# Patient Record
Sex: Male | Born: 1960 | Race: White | Hispanic: No | Marital: Married | Smoking: Never smoker
Health system: Southern US, Community
[De-identification: ages and names within clinical notes are randomized; demographics above are authoritative.]

## PROBLEM LIST (undated history)

## (undated) DIAGNOSIS — K2970 Gastritis, unspecified, without bleeding: Secondary | ICD-10-CM

## (undated) DIAGNOSIS — K219 Gastro-esophageal reflux disease without esophagitis: Secondary | ICD-10-CM

## (undated) DIAGNOSIS — M1990 Unspecified osteoarthritis, unspecified site: Secondary | ICD-10-CM

## (undated) DIAGNOSIS — F32A Depression, unspecified: Secondary | ICD-10-CM

## (undated) HISTORY — DX: Gastro-esophageal reflux disease without esophagitis: K21.9

## (undated) HISTORY — DX: Unspecified osteoarthritis, unspecified site: M19.90

## (undated) HISTORY — DX: Depression, unspecified: F32.A

## (undated) HISTORY — PX: FRACTURE SURGERY: SHX138

## (undated) HISTORY — PX: APPENDECTOMY: SHX54

---

## 2008-05-14 ENCOUNTER — Emergency Department (HOSPITAL_COMMUNITY): Admission: EM | Admit: 2008-05-14 | Discharge: 2008-05-14 | Payer: Self-pay | Admitting: Emergency Medicine

## 2010-12-24 ENCOUNTER — Ambulatory Visit: Payer: Self-pay

## 2010-12-24 ENCOUNTER — Other Ambulatory Visit: Payer: Self-pay | Admitting: Occupational Medicine

## 2010-12-24 DIAGNOSIS — R52 Pain, unspecified: Secondary | ICD-10-CM

## 2014-02-03 ENCOUNTER — Encounter (HOSPITAL_COMMUNITY): Payer: Self-pay | Admitting: Emergency Medicine

## 2014-02-03 ENCOUNTER — Emergency Department (HOSPITAL_COMMUNITY)
Admission: EM | Admit: 2014-02-03 | Discharge: 2014-02-03 | Disposition: A | Discharge status: Home / Self Care | Payer: 59 | Attending: Emergency Medicine | Admitting: Emergency Medicine

## 2014-02-03 DIAGNOSIS — W219XXA Striking against or struck by unspecified sports equipment, initial encounter: Secondary | ICD-10-CM | POA: Insufficient documentation

## 2014-02-03 DIAGNOSIS — S0100XA Unspecified open wound of scalp, initial encounter: Secondary | ICD-10-CM | POA: Diagnosis present

## 2014-02-03 DIAGNOSIS — Y9289 Other specified places as the place of occurrence of the external cause: Secondary | ICD-10-CM | POA: Insufficient documentation

## 2014-02-03 DIAGNOSIS — S0101XA Laceration without foreign body of scalp, initial encounter: Secondary | ICD-10-CM

## 2014-02-03 DIAGNOSIS — Y9364 Activity, baseball: Secondary | ICD-10-CM | POA: Diagnosis not present

## 2014-02-03 MED ORDER — NAPROXEN 500 MG PO TABS: 500.0000 mg | ORAL_TABLET | Freq: Two times a day (BID) | ORAL | Status: DC

## 2014-02-03 MED ORDER — BACITRACIN ZINC 500 UNIT/GM EX OINT: 1.0000 "application " | TOPICAL_OINTMENT | Freq: Two times a day (BID) | CUTANEOUS | Status: DC

## 2014-02-03 MED ORDER — BACITRACIN 500 UNIT/GM EX OINT
1.0000 "application " | TOPICAL_OINTMENT | Freq: Once | CUTANEOUS | Status: AC
  Administered 2014-02-03: 1 via TOPICAL
  Filled 2014-02-03: qty 0.9

## 2014-02-03 MED ORDER — LIDOCAINE-EPINEPHRINE (PF) 2 %-1:200000 IJ SOLN
10.0000 mL | Freq: Once | INTRAMUSCULAR | Status: AC
  Administered 2014-02-03: 10 mL
  Filled 2014-02-03: qty 20

## 2014-02-03 NOTE — ED Notes (Signed)
Patient denies LOC, dizziness or other related symptoms

## 2014-02-03 NOTE — Discharge Instructions (Signed)

## 2014-02-03 NOTE — ED Provider Notes (Signed)
CSN: [id number]     Arrival date & time 02/03/14  2111 History    Chief Complaint  Patient presents with  . Head Laceration    HPI Comments: 53 year old male presents to the emergency department for further evaluation of head laceration. Patient states that he was playing softball when he ran into a fence after running past plate. Patient denies any loss of consciousness. No concussive symptoms including vision changes, nausea, vomiting, extremity numbness/tingling, or extremity weakness. Patient complaining of minimal pain at present. No medications or management of symptoms PTA. Tetanus last updated 3 days ago. Patient is not on blood thinners.  Patient is a 53 y.o. male presenting with scalp laceration. The history is provided by the patient. No language interpreter was used.  Head Laceration Pertinent negatives include no headaches.  Head Laceration Pertinent negatives include no headaches, nausea, numbness, vomiting or weakness.    History reviewed. No pertinent past medical history. Past Surgical History  Procedure Laterality Date  . Appendectomy     No family history on file. History  Substance Use Topics  . Smoking status: Never Smoker   . Smokeless tobacco: Never Used  . Alcohol Use: No    Review of Systems  Gastrointestinal: Negative for nausea and vomiting.  Skin: Positive for wound.  Neurological: Negative for syncope, weakness, numbness and headaches.  All other systems reviewed and are negative.   Allergies  Review of patient's allergies indicates no known allergies.  Home Medications   Prior to Admission medications   Not on File   BP 151/81  Pulse 74  Temp(Src) 98 F (36.7 C) (Oral)  Resp 16  Ht  (1.778 m)  Wt 220 lb (99.791 kg)  BMI 31.57 kg/m2  SpO2 98%  Physical Exam  Nursing note and vitals reviewed. Constitutional: He is oriented to person, place, and time. He appears well-developed and well-nourished. No distress.  HENT:  Head:  Normocephalic. Head is with laceration. Head is without raccoon's eyes and without Battle's sign.    4cm jagged laceration to R frontal forehead. Bleeding controlled.  Eyes: Conjunctivae and EOM are normal. No scleral icterus.  Neck: Normal range of motion.  Cardiovascular: Normal rate.   Pulmonary/Chest: Effort normal. No respiratory distress. He has no wheezes.  Chest expansion symmetric  Musculoskeletal: Normal range of motion.  Neurological: He is alert and oriented to person, place, and time. He exhibits normal muscle tone. Coordination normal.  GCS 15. Patient moves extremities without ataxia.  Skin: Skin is warm and dry. No rash noted. He is not diaphoretic. No erythema. No pallor.  Psychiatric: He has a normal mood and affect. His behavior is normal.    ED Course  Procedures (including critical care time) Labs Review Labs Reviewed - No data to display  No results found for this or any previous visit. No results found.  Imaging Review No results found.   EKG Interpretation None     Medications - No data to display  LACERATION REPAIR Performed by: Antony Madura Authorized by: Antony Madura Consent: Verbal consent obtained. Risks and benefits: risks, benefits and alternatives were discussed Consent given by: patient Patient identity confirmed: provided demographic data Prepped and Draped in normal sterile fashion Wound explored  Laceration Location: R frontal scalp  Laceration Length: 4cm  No Foreign Bodies seen or palpated  Anesthesia: local infiltration  Local anesthetic: lidocaine 2% with epinephrine  Anesthetic total: 3 ml  Irrigation method: syringe Amount of cleaning: standard  Skin closure: 4-0 prolene  Number of sutures: 4  Technique: simple interrupted  Patient tolerance: Patient tolerated the procedure well with no immediate complications.   MDM   Final diagnoses:  Scalp laceration, initial encounter    Tdap booster UTD. Laceration  occurred < 8 hours prior to repair which was well tolerated. Pt has no comorbidities to effect normal wound healing. Discussed suture home care with patient and answered questions. Pt to follow up for wound check and suture removal in 5-7 days. Pt is hemodynamically stable with no complaints prior to discharge. Return precautions provided and patient agreeable to plan with no unaddressed concerns.    I personally performed the services described in this documentation, which was scribed in my presence. The recorded information has been reviewed and is accurate.   Filed Vitals:   02/03/14 2128 02/03/14 2132  BP:  151/81  Pulse:  74  Temp:  98 F (36.7 C)  TempSrc: Oral Oral  Resp:  16  Height:   (1.778 m)  Weight:  220 lb (99.791 kg)  SpO2:  98%     Antony Madura, PA-C 02/03/14 2306

## 2014-02-03 NOTE — ED Notes (Signed)
Patient presents with dressing on his forehead.  States he was playing ball in a senior league and over ran home plate and hit his forehead on the fence,

## 2014-02-04 NOTE — ED Provider Notes (Signed)
Medical screening examination/treatment/procedure(s) were performed by non-physician practitioner and as supervising physician I was immediately available for consultation/collaboration.   EKG Interpretation None       Elgie Maziarz, MD 02/04/14 1500 

## 2014-05-24 ENCOUNTER — Emergency Department (HOSPITAL_COMMUNITY): Payer: Worker's Compensation

## 2014-05-24 ENCOUNTER — Encounter (HOSPITAL_COMMUNITY): Payer: Self-pay | Admitting: Family Medicine

## 2014-05-24 ENCOUNTER — Emergency Department (HOSPITAL_COMMUNITY)
Admission: EM | Admit: 2014-05-24 | Discharge: 2014-05-24 | Disposition: A | Discharge status: Home / Self Care | Payer: Worker's Compensation | Attending: Emergency Medicine | Admitting: Emergency Medicine

## 2014-05-24 DIAGNOSIS — Y9389 Activity, other specified: Secondary | ICD-10-CM | POA: Diagnosis not present

## 2014-05-24 DIAGNOSIS — Z791 Long term (current) use of non-steroidal anti-inflammatories (NSAID): Secondary | ICD-10-CM | POA: Insufficient documentation

## 2014-05-24 DIAGNOSIS — Z23 Encounter for immunization: Secondary | ICD-10-CM | POA: Insufficient documentation

## 2014-05-24 DIAGNOSIS — Z8719 Personal history of other diseases of the digestive system: Secondary | ICD-10-CM | POA: Insufficient documentation

## 2014-05-24 DIAGNOSIS — W11XXXA Fall on and from ladder, initial encounter: Secondary | ICD-10-CM | POA: Diagnosis not present

## 2014-05-24 DIAGNOSIS — S5011XA Contusion of right forearm, initial encounter: Secondary | ICD-10-CM | POA: Diagnosis not present

## 2014-05-24 DIAGNOSIS — Z792 Long term (current) use of antibiotics: Secondary | ICD-10-CM | POA: Insufficient documentation

## 2014-05-24 DIAGNOSIS — S0081XA Abrasion of other part of head, initial encounter: Secondary | ICD-10-CM | POA: Insufficient documentation

## 2014-05-24 DIAGNOSIS — S0993XA Unspecified injury of face, initial encounter: Secondary | ICD-10-CM | POA: Diagnosis present

## 2014-05-24 DIAGNOSIS — S0083XA Contusion of other part of head, initial encounter: Secondary | ICD-10-CM | POA: Diagnosis not present

## 2014-05-24 DIAGNOSIS — Y9289 Other specified places as the place of occurrence of the external cause: Secondary | ICD-10-CM | POA: Insufficient documentation

## 2014-05-24 DIAGNOSIS — Y998 Other external cause status: Secondary | ICD-10-CM | POA: Diagnosis not present

## 2014-05-24 HISTORY — DX: Gastritis, unspecified, without bleeding: K29.70

## 2014-05-24 MED ORDER — TETANUS-DIPHTH-ACELL PERTUSSIS 5-2.5-18.5 LF-MCG/0.5 IM SUSP
0.5000 mL | Freq: Once | INTRAMUSCULAR | Status: AC
  Administered 2014-05-24: 0.5 mL via INTRAMUSCULAR
  Filled 2014-05-24: qty 0.5

## 2014-05-24 MED ORDER — FENTANYL CITRATE 0.05 MG/ML IJ SOLN
50.0000 ug | Freq: Once | INTRAMUSCULAR | Status: AC
Start: 2014-05-24 — End: 2014-05-24
  Administered 2014-05-24: 50 ug via INTRAVENOUS
  Filled 2014-05-24: qty 2

## 2014-05-24 MED ORDER — MORPHINE SULFATE 4 MG/ML IJ SOLN
4.0000 mg | Freq: Once | INTRAMUSCULAR | Status: AC
  Administered 2014-05-24: 4 mg via INTRAVENOUS
  Filled 2014-05-24: qty 1

## 2014-05-24 MED ORDER — HYDROCODONE-ACETAMINOPHEN 5-325 MG PO TABS: 1.0000 | ORAL_TABLET | Freq: Four times a day (QID) | ORAL | Status: DC | PRN

## 2014-05-24 NOTE — ED Notes (Addendum)
Pt presents via POV with c/o falling approx 756ft off of a ladder. Pt has significant edema to right forearm, and abrasions to right forehead and 2 small abrasions to posterior head. PMS intact to LUE. Pt denies LOC. Pt A&Ox4 in NAD.

## 2014-05-24 NOTE — ED Notes (Signed)
Pt transported to CT ?

## 2014-05-24 NOTE — Discharge Instructions (Signed)
Contusion °A contusion is a deep bruise. Contusions are the result of an injury that caused bleeding under the skin. The contusion may turn blue, purple, or yellow. Minor injuries will give you a painless contusion, but more severe contusions may stay painful and swollen for a few weeks.  °CAUSES  °A contusion is usually caused by a blow, trauma, or direct force to an area of the body. °SYMPTOMS  °· Swelling and redness of the injured area. °· Bruising of the injured area. °· Tenderness and soreness of the injured area. °· Pain. °DIAGNOSIS  °The diagnosis can be made by taking a history and physical exam. An X-ray, CT scan, or MRI may be needed to determine if there were any associated injuries, such as fractures. °TREATMENT  °Specific treatment will depend on what area of the body was injured. In general, the best treatment for a contusion is resting, icing, elevating, and applying cold compresses to the injured area. Over-the-counter medicines may also be recommended for pain control. Ask your caregiver what the best treatment is for your contusion. °HOME CARE INSTRUCTIONS  °· Put ice on the injured area. °· Put ice in a plastic bag. °· Place a towel between your skin and the bag. °· Leave the ice on for 15-20 minutes, 3-4 times a day, or as directed by your health care provider. °· Only take over-the-counter or prescription medicines for pain, discomfort, or fever as directed by your caregiver. Your caregiver may recommend avoiding anti-inflammatory medicines (aspirin, ibuprofen, and naproxen) for 48 hours because these medicines may increase bruising. °· Rest the injured area. °· If possible, elevate the injured area to reduce swelling. °SEEK IMMEDIATE MEDICAL CARE IF:  °· You have increased bruising or swelling. °· You have pain that is getting worse. °· Your swelling or pain is not relieved with medicines. °MAKE SURE YOU:  °· Understand these instructions. °· Will watch your condition. °· Will get help right  away if you are not doing well or get worse. °Document Released: 02/06/2005 Document Revised: 05/04/2013 Document Reviewed: 03/04/2011 °[employer]® Patient Information ©2015 [employer]. This information is not intended to replace advice given to you by your health care provider. Make sure you discuss any questions you have with your health care provider. ° °Blunt Trauma °You have been evaluated for injuries. You have been examined and your caregiver has not found injuries serious enough to require hospitalization. °It is common to have multiple bruises and sore muscles following an accident. These tend to feel worse for the first 24 hours. You will feel more stiffness and soreness over the next several hours and worse when you wake up the first morning after your accident. After this point, you should begin to improve with each passing day. The amount of improvement depends on the amount of damage done in the accident. °Following your accident, if some part of your body does not work as it should, or if the pain in any area continues to increase, you should return to the Emergency Department for re-evaluation.  °HOME CARE INSTRUCTIONS  °Routine care for sore areas should include: °· Ice to sore areas every 2 hours for 20 minutes while awake for the next 2 days. °· Drink extra fluids (not alcohol). °· Take a hot or warm shower or bath once or twice a day to increase blood flow to sore muscles. This will help you "limber up". °· Activity as tolerated. Lifting may aggravate neck or back pain. °· Only take over-the-counter or prescription   medicines for pain, discomfort, or fever as directed by your caregiver. Do not use aspirin. This may increase bruising or increase bleeding if there are small areas where this is happening. °SEEK IMMEDIATE MEDICAL CARE IF: °· Numbness, tingling, weakness, or problem with the use of your arms or legs. °· A severe headache is not relieved with medications. °· There is a change in bowel  or bladder control. °· Increasing pain in any areas of the body. °· Short of breath or dizzy. °· Nauseated, vomiting, or sweating. °· Increasing belly (abdominal) discomfort. °· Blood in urine, stool, or vomiting blood. °· Pain in either shoulder in an area where a shoulder strap would be. °· Feelings of lightheadedness or if you have a fainting episode. °Sometimes it is not possible to identify all injuries immediately after the trauma. It is important that you continue to monitor your condition after the emergency department visit. If you feel you are not improving, or improving more slowly than should be expected, call your physician. If you feel your symptoms (problems) are worsening, return to the Emergency Department immediately. °Document Released: 01/23/2001 Document Revised: 07/22/2011 Document Reviewed: 12/16/2007 °[employer]® Patient Information ©2015 [employer]. This information is not intended to replace advice given to you by your health care provider. Make sure you discuss any questions you have with your health care provider. ° °

## 2014-05-24 NOTE — ED Provider Notes (Signed)
CSN: [id number]     Arrival date & time 05/24/14  1414 History   First MD Initiated Contact with Patient 05/24/14 1455     Chief Complaint  Patient presents with  . Fall     (Consider location/radiation/quality/duration/timing/severity/associated sxs/prior Treatment) HPI Comments: Patient fell approximately 6 feet off of a ladder at work onto his right arm.  He states he thinks his face had a woodpile.  He denies loss of consciousness.  Accident happened 2 hours away from home and he then proceeded to drive himself home.  He denies headache, neck pain, nausea, vomiting, numbness, weakness, chest pain, abdominal pain or back pain.  He thinks he had a tetanus shot 4-5 years ago  Patient is a 54 y.o. male presenting with fall. The history is provided by the patient and the spouse. No language interpreter was used.  Fall This is a new problem. The current episode started 3 to 5 hours ago. The problem occurs rarely. The problem has not changed since onset.Pertinent negatives include no chest pain, no abdominal pain, no headaches and no shortness of breath. Associated symptoms comments: L forearm pain . Exacerbated by: movement. The symptoms are relieved by ice (immobilization). He has tried nothing for the symptoms. The treatment provided no relief.    Past Medical History  Diagnosis Date  . Gastritis    Past Surgical History  Procedure Laterality Date  . Appendectomy     No family history on file. History  Substance Use Topics  . Smoking status: Never Smoker   . Smokeless tobacco: Never Used  . Alcohol Use: No    Review of Systems  Constitutional: Negative for fever, activity change, appetite change and fatigue.  HENT: Negative for congestion, facial swelling, rhinorrhea and trouble swallowing.   Eyes: Negative for photophobia and pain.  Respiratory: Negative for cough, chest tightness and shortness of breath.   Cardiovascular: Negative for chest pain and leg swelling.    Gastrointestinal: Negative for nausea, vomiting, abdominal pain, diarrhea and constipation.  Endocrine: Negative for polydipsia and polyuria.  Genitourinary: Negative for dysuria, urgency, decreased urine volume and difficulty urinating.  Musculoskeletal: Positive for arthralgias. Negative for back pain and gait problem.  Skin: Negative for color change, rash and wound.  Allergic/Immunologic: Negative for immunocompromised state.  Neurological: Negative for dizziness, facial asymmetry, speech difficulty, weakness, numbness and headaches.  Psychiatric/Behavioral: Negative for confusion, decreased concentration and agitation.      Allergies  Review of patient's allergies indicates no known allergies.  Home Medications   Prior to Admission medications   Medication Sig Start Date End Date Taking? Authorizing Provider  bacitracin ointment Apply 1 application topically 2 (two) times daily. Apply to wound to prevent scarring 02/03/14   Antony MaduraKelly Humes, PA-C  naproxen (NAPROSYN) 500 MG tablet Take 1 tablet (500 mg total) by mouth 2 (two) times daily. 02/03/14   Antony MaduraKelly Humes, PA-C   BP 151/88 mmHg  Pulse 70  Temp(Src) 98.1 F (36.7 C) (Oral)  Resp 16  Ht 5\' 10"  (1.778 m)  Wt 218 lb (98.884 kg)  BMI 31.28 kg/m2  SpO2 97% Physical Exam  Constitutional: He is oriented to person, place, and time. He appears well-developed and well-nourished. No distress.  HENT:  Head: Normocephalic. Head is with abrasion.    Mouth/Throat: No oropharyngeal exudate.  Eyes: Pupils are equal, round, and reactive to light.  Neck: Normal range of motion. Neck supple.  Cardiovascular: Normal rate, regular rhythm and normal heart sounds.  Exam reveals no gallop  and no friction rub.   No murmur heard. Pulmonary/Chest: Effort normal and breath sounds normal. No respiratory distress. He has no wheezes. He has no rales.  Abdominal: Soft. Bowel sounds are normal. He exhibits no distension and no mass. There is no  tenderness. There is no rebound and no guarding.  Musculoskeletal: Normal range of motion. He exhibits no edema.       Right forearm: He exhibits tenderness, swelling and deformity.       Arms: Neurological: He is alert and oriented to person, place, and time. He has normal strength. He displays no tremor. No cranial nerve deficit or sensory deficit. He exhibits normal muscle tone. Coordination normal. GCS eye subscore is 4. GCS verbal subscore is 5. GCS motor subscore is 6.  Skin: Skin is warm and dry.  Psychiatric: He has a normal mood and affect.    ED Course  Procedures (including critical care time) Labs Review Labs Reviewed - No data to display  Imaging Review No results found.   EKG Interpretation None      MDM   Final diagnoses:  Fall from ladder, initial encounter    Pt is a 54 y.o. male with Pmhx as above who presents with L forearm pain/deformity and multiple abrasions to head after falling off a ladder from approx 6 foot height at work today. No LOC, no other complaints of pain, though I am concerned pt has a distracting injury with R arm mid forearm deformity. Had a risk-benefit discussion about neuro imaging w/ wife, have decided to pursue neuro imaging as well as imaging of L forearm. Pt NVI distally.    there are no acute CT had her C-spine findings.  X-ray forearm is negative except for soft tissue swelling ( not noted on x-ray.  The visualized on exam).   Will place in sling for comfort and discharged home with Norco as needed for pain.   Ranee Gosselin [place] evaluation in the Emergency Department is complete. It has been determined that no acute conditions requiring further emergency intervention are present at this time. The patient/guardian have been advised of the diagnosis and plan. We have discussed signs and symptoms that warrant return to the ED, such as changes or worsening in symptoms including, worsening pain, numbness, weakness, confusion, nausea, vomiting,  visual changes.      Toy Cookey, MD 05/24/14 1630

## 2014-05-24 NOTE — ED Notes (Signed)
Pt verbalized understanding of discharge instructions/prescriptions. No further questions.  

## 2014-05-25 NOTE — ED Notes (Signed)
This RN witnessed Shanna CiscoKevin Brown, RN waste 50mcg Fentanyl.

## 2015-06-17 IMAGING — CT CT HEAD W/O CM
1 series · 16 of 30 positions shown, 20 images · non-contrast
Comparison: None.

CLINICAL DATA: Fall from ladder.  Abrasions to right forehead.

EXAM:
CT HEAD WITHOUT CONTRAST
CT CERVICAL SPINE WITHOUT CONTRAST
TECHNIQUE: Multidetector CT imaging of the head and cervical spine was
performed following the standard protocol without intravenous
contrast. Multiplanar CT image reconstructions of the cervical spine
were also generated.

[Series 2: head 5.0 h30s · axial · 0.49mm/px · z∈[-64,+76]mm · 16 of 32 slices shown, 20 images]
[im 2/32  brain]
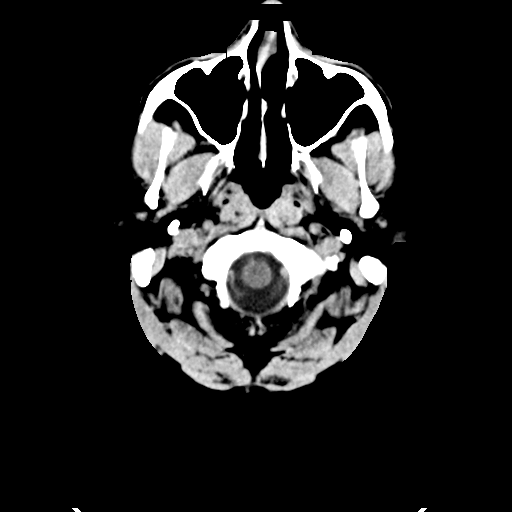
[im 2/32  bone]
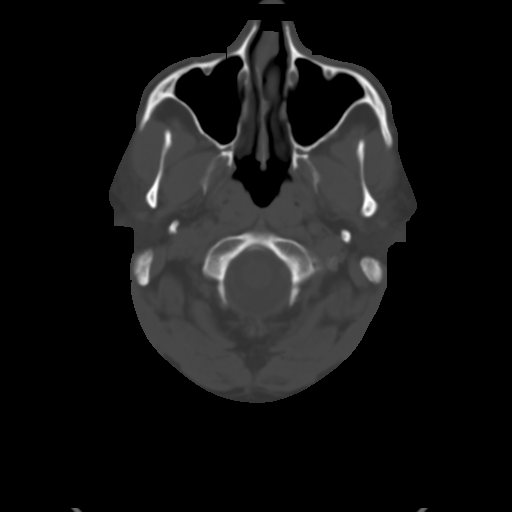
[im 4/32  brain]
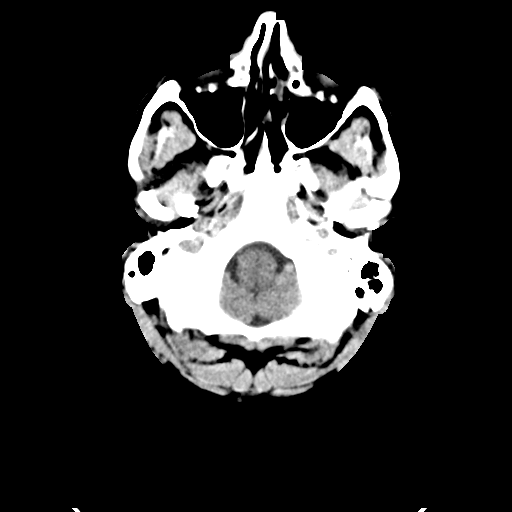
[im 6/32  brain]
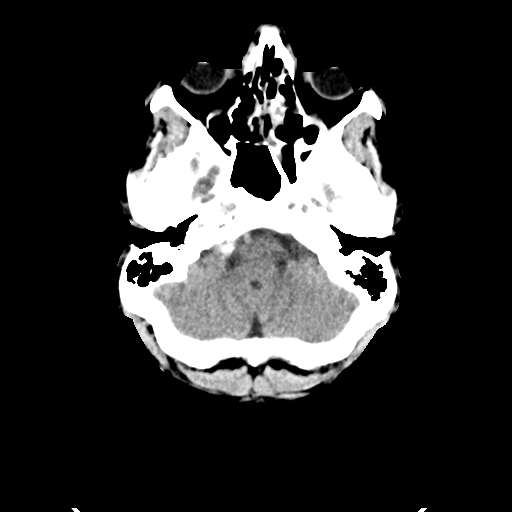
[im 8/32  brain]
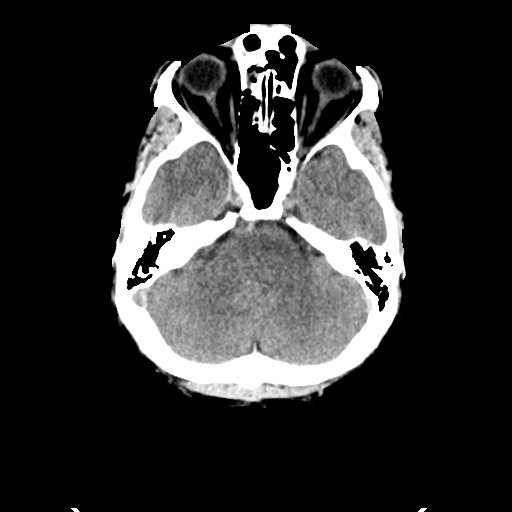
[im 9/32  brain]
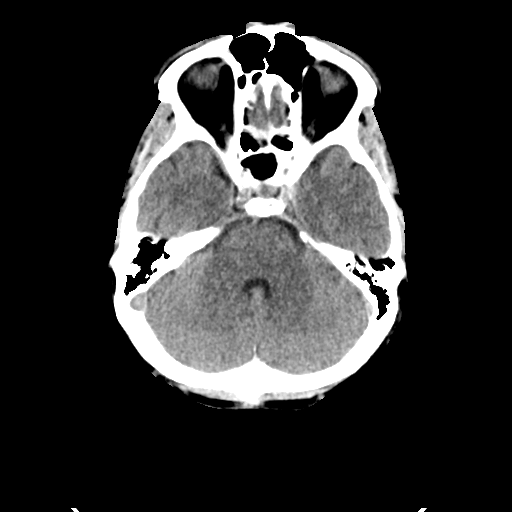
[im 9/32  bone]
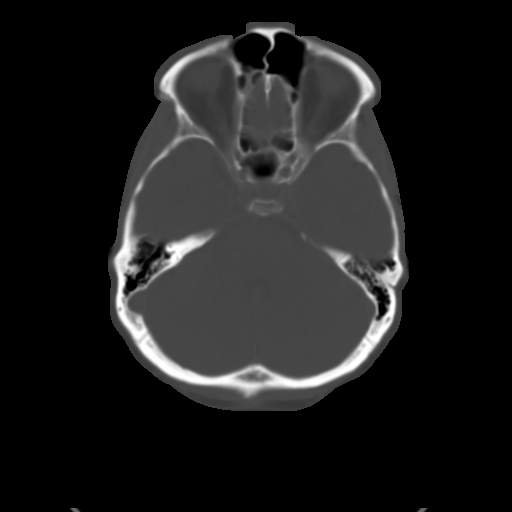
[im 11/32  brain]
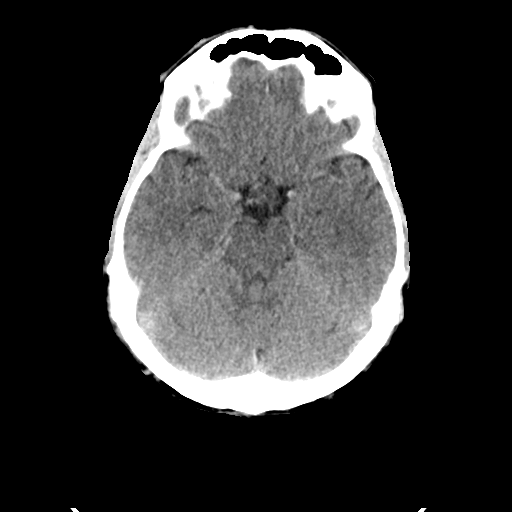
[im 13/32  brain]
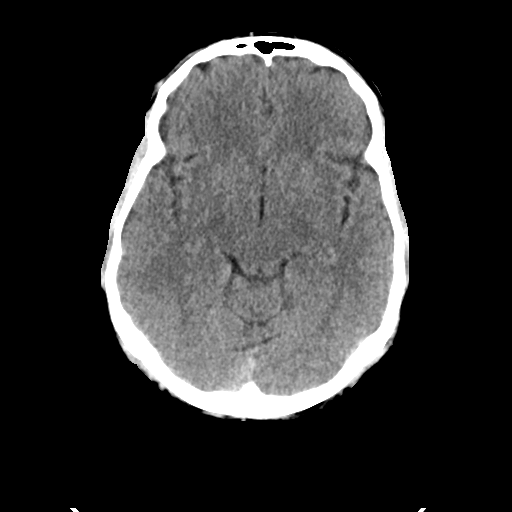
[im 15/32  brain]
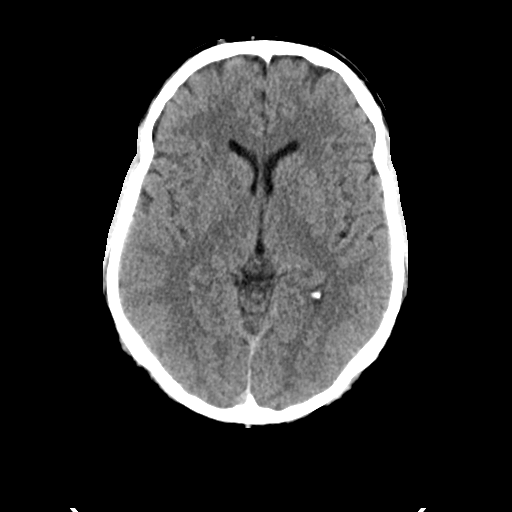
[im 17/32  brain]
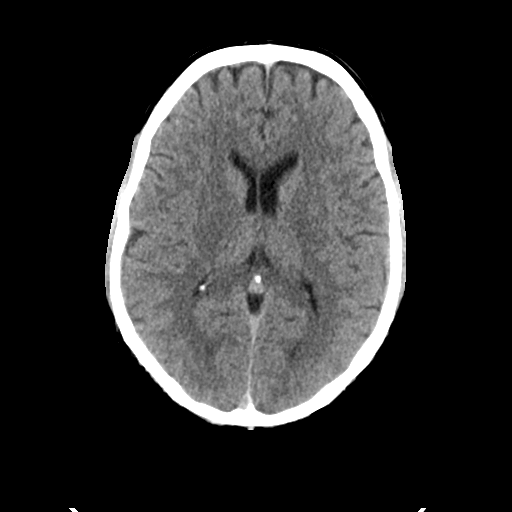
[im 17/32  bone]
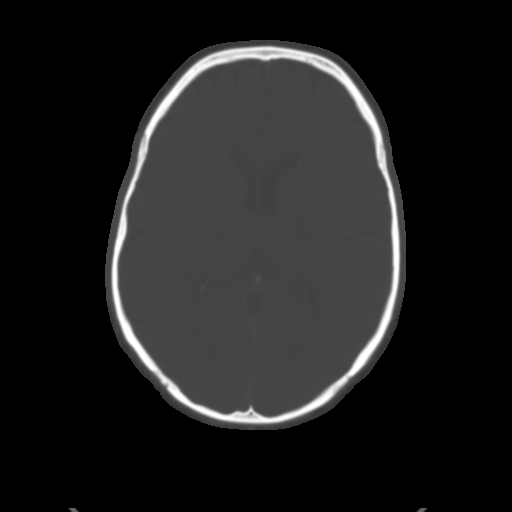
[im 19/32  brain]
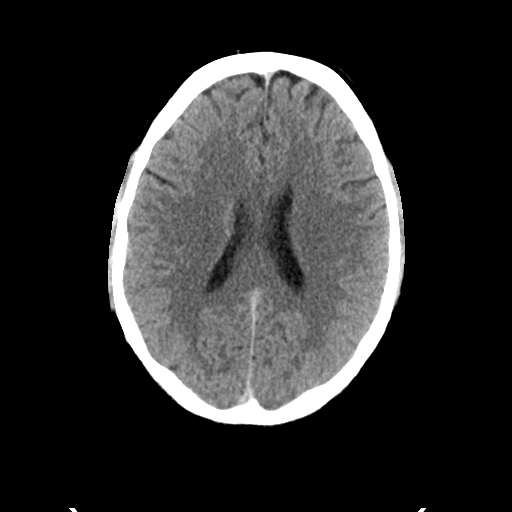
[im 21/32  brain]
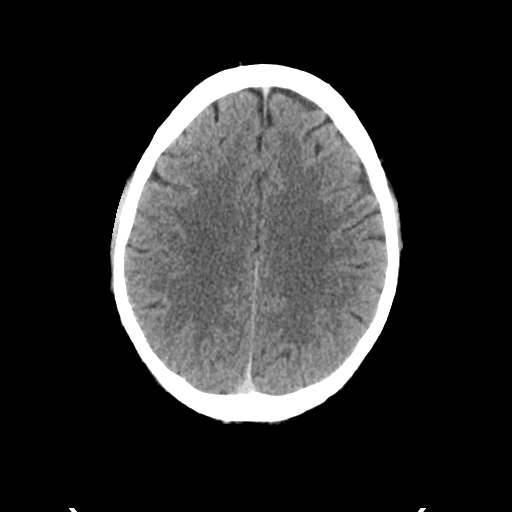
[im 23/32  brain]
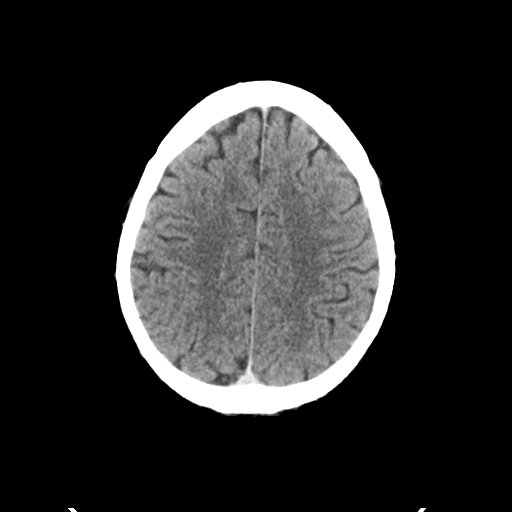
[im 24/32  brain]
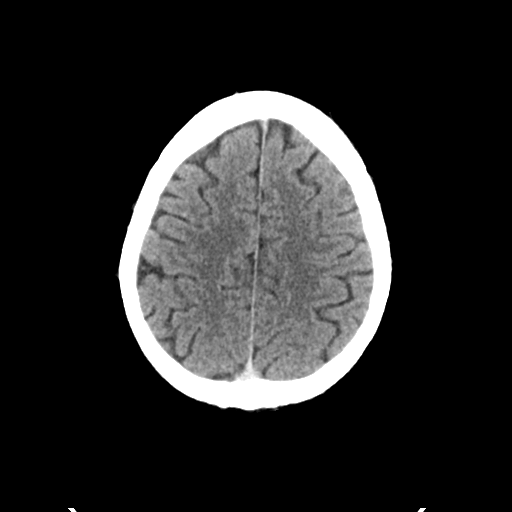
[im 24/32  bone]
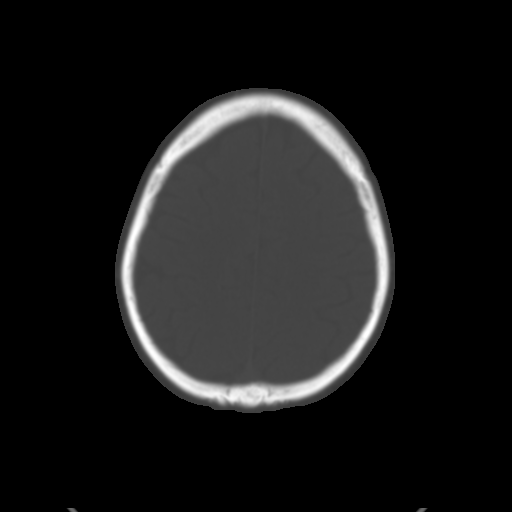
[im 26/32  brain]
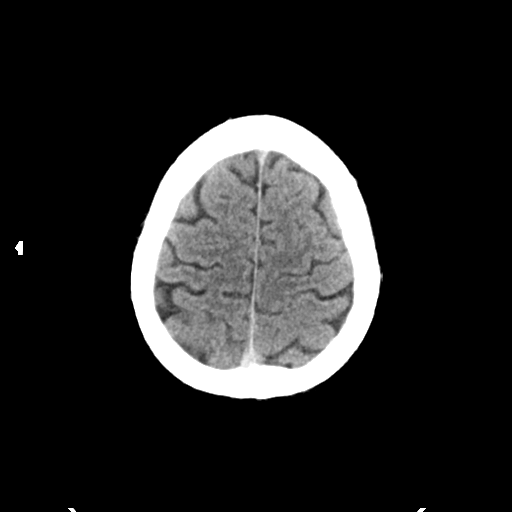
[im 28/32  brain]
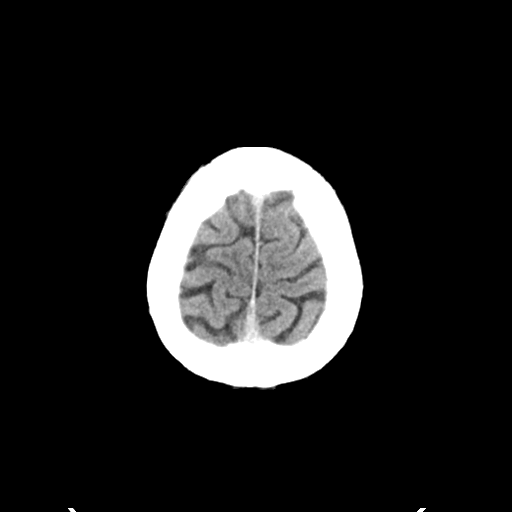
[im 30/32  brain]
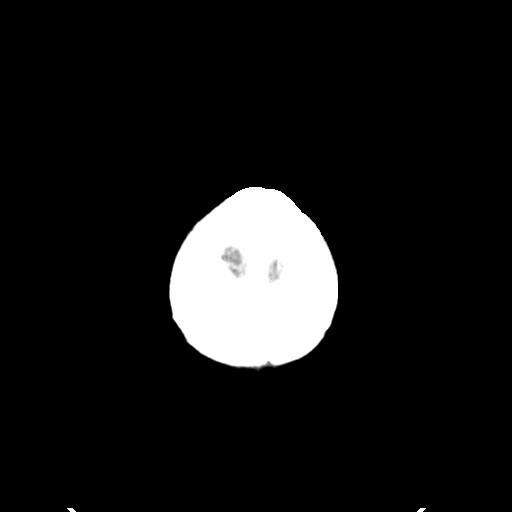

[16 of 30 positions shown; findings below may reference images not displayed]

FINDINGS: CT HEAD FINDINGS

No acute intracranial abnormality. Specifically, no hemorrhage,
hydrocephalus, mass lesion, acute infarction, or significant
intracranial injury. No acute calvarial abnormality.

Mucosal thickening in the paranasal sinuses. No air-fluid levels.
Mastoid air cells are clear.

CT CERVICAL SPINE FINDINGS

Normal alignment. No fracture. Prevertebral soft tissues are normal.
Disc spaces are maintained. Disc spaces are maintained. No epidural
or paraspinal hematoma.
IMPRESSION: No acute intracranial abnormality.

Chronic sinusitis.

No acute bony abnormality within the cervical spine.

## 2018-06-11 ENCOUNTER — Emergency Department (HOSPITAL_COMMUNITY): Payer: 59

## 2018-06-11 ENCOUNTER — Other Ambulatory Visit: Payer: Self-pay

## 2018-06-11 ENCOUNTER — Emergency Department (HOSPITAL_COMMUNITY)
Admission: EM | Admit: 2018-06-11 | Discharge: 2018-06-12 | Disposition: A | Discharge status: Home / Self Care | Payer: 59 | Attending: Emergency Medicine | Admitting: Emergency Medicine

## 2018-06-11 DIAGNOSIS — R0789 Other chest pain: Secondary | ICD-10-CM | POA: Diagnosis not present

## 2018-06-11 DIAGNOSIS — M79602 Pain in left arm: Secondary | ICD-10-CM | POA: Insufficient documentation

## 2018-06-11 DIAGNOSIS — R079 Chest pain, unspecified: Secondary | ICD-10-CM

## 2018-06-11 LAB — CBC
HCT: 42.6 % (ref 39.0–52.0)
Hemoglobin: 15.1 g/dL (ref 13.0–17.0)
MCH: 29.5 pg (ref 26.0–34.0)
MCHC: 35.4 g/dL (ref 30.0–36.0)
MCV: 83.2 fL (ref 80.0–100.0)
NRBC: 0 % (ref 0.0–0.2)
PLATELETS: 251 10*3/uL (ref 150–400)
RBC: 5.12 MIL/uL (ref 4.22–5.81)
RDW: 12.3 % (ref 11.5–15.5)
WBC: 11.6 10*3/uL — ABNORMAL HIGH (ref 4.0–10.5)

## 2018-06-11 LAB — BASIC METABOLIC PANEL
ANION GAP: 7 (ref 5–15)
BUN: 16 mg/dL (ref 6–20)
CALCIUM: 8.9 mg/dL (ref 8.9–10.3)
CO2: 27 mmol/L (ref 22–32)
Chloride: 103 mmol/L (ref 98–111)
Creatinine, Ser: 1.46 mg/dL — ABNORMAL HIGH (ref 0.61–1.24)
GFR, EST NON AFRICAN AMERICAN: 53 mL/min — AB (ref >60)
GLUCOSE: 80 mg/dL (ref 70–99)
Potassium: 4 mmol/L (ref 3.5–5.1)
Sodium: 137 mmol/L (ref 135–145)

## 2018-06-11 LAB — I-STAT TROPONIN, ED: TROPONIN I, POC: 0.01 ng/mL (ref 0.00–0.08)

## 2018-06-11 MED ORDER — SODIUM CHLORIDE 0.9% FLUSH: 3.0000 mL | Freq: Once | INTRAVENOUS | Status: DC

## 2018-06-11 NOTE — ED Triage Notes (Signed)
Pt arrives via POV with complaints of left sided chest pain that radiates to his back since yesterday. Pt has no cardiac history that he knows of, states he does not visit the doctor at all. Pt denies nausea, vomiting, or SOB.

## 2018-06-11 NOTE — ED Provider Notes (Signed)
[facility] EMERGENCY DEPARTMENT Provider Note  CSN: [id number] Arrival date & time: 06/11/18 1854  Chief Complaint(s) Chest Pain  HPI Kenneth Calhoun is a 58 y.o. male   The history is provided by the patient.  Chest Pain  Pain location:  L chest and L lateral chest (left back pain.) Pain quality comment:  "soreness" Pain radiates to:  L arm Pain severity now: mild to severe. Onset quality:  Gradual Duration:  3 days Timing:  Constant Progression:  Waxing and waning Chronicity:  Recurrent Context: movement   Relieved by:  Certain positions (applying pressure and heat to the area) Worsened by:  Movement Associated symptoms: back pain   Associated symptoms: no cough, no diaphoresis, no fatigue, no fever and no nausea     Past Medical History Past Medical History:  Diagnosis Date  . Gastritis    There are no active problems to display for this patient.  Home Medication(s) Prior to Admission medications   Medication Sig Start Date End Date Taking? Authorizing Provider  ibuprofen (ADVIL,MOTRIN) 200 MG tablet Take 400 mg by mouth every morning.   Yes [provider]  Ibuprofen-Diphenhydramine HCl (ADVIL PM) 200-25 MG CAPS Take 2 tablets by mouth at bedtime as needed (sleep).   Yes [provider]  cyclobenzaprine (FLEXERIL) 10 MG tablet Take 1 tablet (10 mg total) by mouth at bedtime for 10 days. 06/12/18 06/22/18  Nira Conn, MD  HYDROcodone-acetaminophen (NORCO) 5-325 MG per tablet Take 1 tablet by mouth every 6 (six) hours as needed. Patient not taking: Reported on 06/12/2018 05/24/14   Toy Cookey, MD                                                                                                                                    Past Surgical History Past Surgical History:  Procedure Laterality Date  . APPENDECTOMY     Family History No family history on file.  Social History Social History   Tobacco Use  .  Smoking status: Never Smoker  . Smokeless tobacco: Never Used  Substance Use Topics  . Alcohol use: No  . Drug use: No   Allergies Patient has no known allergies.  Review of Systems Review of Systems  Constitutional: Negative for diaphoresis, fatigue and fever.  Respiratory: Negative for cough.   Cardiovascular: Positive for chest pain.  Gastrointestinal: Negative for nausea.  Musculoskeletal: Positive for back pain.   All other systems are reviewed and are negative for acute change except as noted in the HPI  Physical Exam Vital Signs  I have reviewed the triage vital signs BP 135/72 (BP Location: Left Arm)   Pulse 84   Temp 98.2 F (36.8 C) (Oral)   Resp 15   Ht 5\' 10"  (1.778 m)   Wt 99.8 kg   SpO2 100%   BMI 31.57 kg/m   Physical Exam Vitals signs reviewed.  Constitutional:  General: He is not in acute distress.    Appearance: He is well-developed. He is not diaphoretic.  HENT:     Head: Normocephalic and atraumatic.     Nose: Nose normal.  Eyes:     General: No scleral icterus.       Right eye: No discharge.        Left eye: No discharge.     Conjunctiva/sclera: Conjunctivae normal.     Pupils: Pupils are equal, round, and reactive to light.  Neck:     Musculoskeletal: Normal range of motion and neck supple.  Cardiovascular:     Rate and Rhythm: Normal rate and regular rhythm.     Heart sounds: No murmur. No friction rub. No gallop.   Pulmonary:     Effort: Pulmonary effort is normal. No respiratory distress.     Breath sounds: Normal breath sounds. No stridor. No rales.  Chest:     Chest wall: No tenderness.  Abdominal:     General: There is no distension.     Palpations: Abdomen is soft.     Tenderness: There is no abdominal tenderness.  Musculoskeletal:        General: No tenderness.     Comments: Pain reproduced with range of motion of the left upper extremity.  Skin:    General: Skin is warm and dry.     Findings: No erythema or rash.    Neurological:     Mental Status: He is alert and oriented to person, place, and time.     ED Results and Treatments Labs (all labs ordered are listed, but only abnormal results are displayed) Labs Reviewed  BASIC METABOLIC PANEL - Abnormal; Notable for the following components:      Result Value   Creatinine, Ser 1.46 (*)    GFR calc non Af Amer 53 (*)    All other components within normal limits  CBC - Abnormal; Notable for the following components:   WBC 11.6 (*)    All other components within normal limits  I-STAT TROPONIN, ED                                                                                                                         EKG  EKG Interpretation  Date/Time:  Thursday June 11 2018 23:55:48 EST Ventricular Rate:  74 PR Interval:    QRS Duration: 101 QT Interval:  369 QTC Calculation: 410 R Axis:   82 Text Interpretation:  Sinus rhythm Baseline wander in lead(s) V2 Nonspecific T wave abnormality NO STEMI No old tracing to compare Confirmed by Drema Pryardama, Pedro [phone]([id number]) on 06/12/2018 12:07:00 AM      Radiology Dg Chest 2 View  Result Date: 06/11/2018 CLINICAL DATA:  Left-sided chest pain radiating into left shoulder for 2 days. EXAM: CHEST - 2 VIEW COMPARISON:  None. FINDINGS: The heart size and mediastinal contours are within normal limits. Both lungs are clear. The visualized skeletal structures are unremarkable. IMPRESSION: No active  cardiopulmonary disease. Electronically Signed   By: Gerome Sam III M.D   On: 06/11/2018 21:03   Pertinent labs & imaging results that were available during my care of the patient were reviewed by me and considered in my medical decision making (see chart for details).  Medications Ordered in ED Medications  sodium chloride flush (NS) 0.9 % injection 3 mL (has no administration in time range)  ketorolac (TORADOL) 30 MG/ML injection 30 mg (30 mg Intramuscular Given 06/12/18 0026)                                                                                                                                     Procedures Procedures  (including critical care time)  Medical Decision Making / ED Course I have reviewed the nursing notes for this encounter and the patient's prior records (if available in EHR or on provided paperwork).    3 days of constant atypical chest pain most consistent with likely chest wall/muscle strain.  Inconsistent with ACS.  EKG with nonspecific T wave inversions only in lead III, otherwise nonischemic and without evidence of pericarditis.  Troponin negative.  Given the duration of his symptoms, feel this is sufficient to rule out ACS.  Low pretest probability for pulmonary embolism.  Presentation not classic for aortic dissection or esophageal perforation.  Chest x-ray without evidence suggestive of pneumonia, pneumothorax, pneumomediastinum.  No abnormal contour of the mediastinum to suggest dissection. No evidence of acute injuries.  The patient appears reasonably screened and/or stabilized for discharge and I doubt any other medical condition or other [facility] requiring further screening, evaluation, or treatment in the ED at this time prior to discharge.  The patient is safe for discharge with strict return precautions.   Final Clinical Impression(s) / ED Diagnoses Final diagnoses:  Left-sided chest pain    Disposition: Discharge  Condition: Good  I have discussed the results, Dx and Tx plan with the patient who expressed understanding and agree(s) with the plan. Discharge instructions discussed at great length. The patient was given strict return precautions who verbalized understanding of the instructions. No further questions at time of discharge.    ED Discharge Orders         Ordered    cyclobenzaprine (FLEXERIL) 10 MG tablet  Daily at bedtime     06/12/18 0048           Follow Up: Primary care provider  Schedule an appointment as soon as possible for a  visit  If you do not have a primary care physician, contact [facility] at [phone] for referral     This chart was dictated using voice recognition software.  Despite best efforts to proofread,  errors can occur which can change the documentation meaning.   Nira Conn, MD 06/12/18 [phone]

## 2018-06-12 MED ORDER — CYCLOBENZAPRINE HCL 10 MG PO TABS: 10.0000 mg | ORAL_TABLET | Freq: Every day | ORAL | 0 refills | Status: AC

## 2018-06-12 MED ORDER — KETOROLAC TROMETHAMINE 30 MG/ML IJ SOLN
30.0000 mg | Freq: Once | INTRAMUSCULAR | Status: AC
  Administered 2018-06-12: 30 mg via INTRAMUSCULAR
  Filled 2018-06-12: qty 1

## 2018-06-12 NOTE — Discharge Instructions (Addendum)
You may use over-the-counter Motrin (Ibuprofen), Acetaminophen (Tylenol), topical muscle creams such as SalonPas, Icy Hot, Bengay, etc. Please stretch, apply heat, and have massage therapy for additional assistance. ° °

## 2019-07-05 IMAGING — CR DG CHEST 2V
2 series · 2 of 2 positions shown · non-contrast
Comparison: None.

CLINICAL DATA: Left-sided chest pain radiating into left shoulder
for 2 days.

EXAM:
CHEST - 2 VIEW

[chest pa]
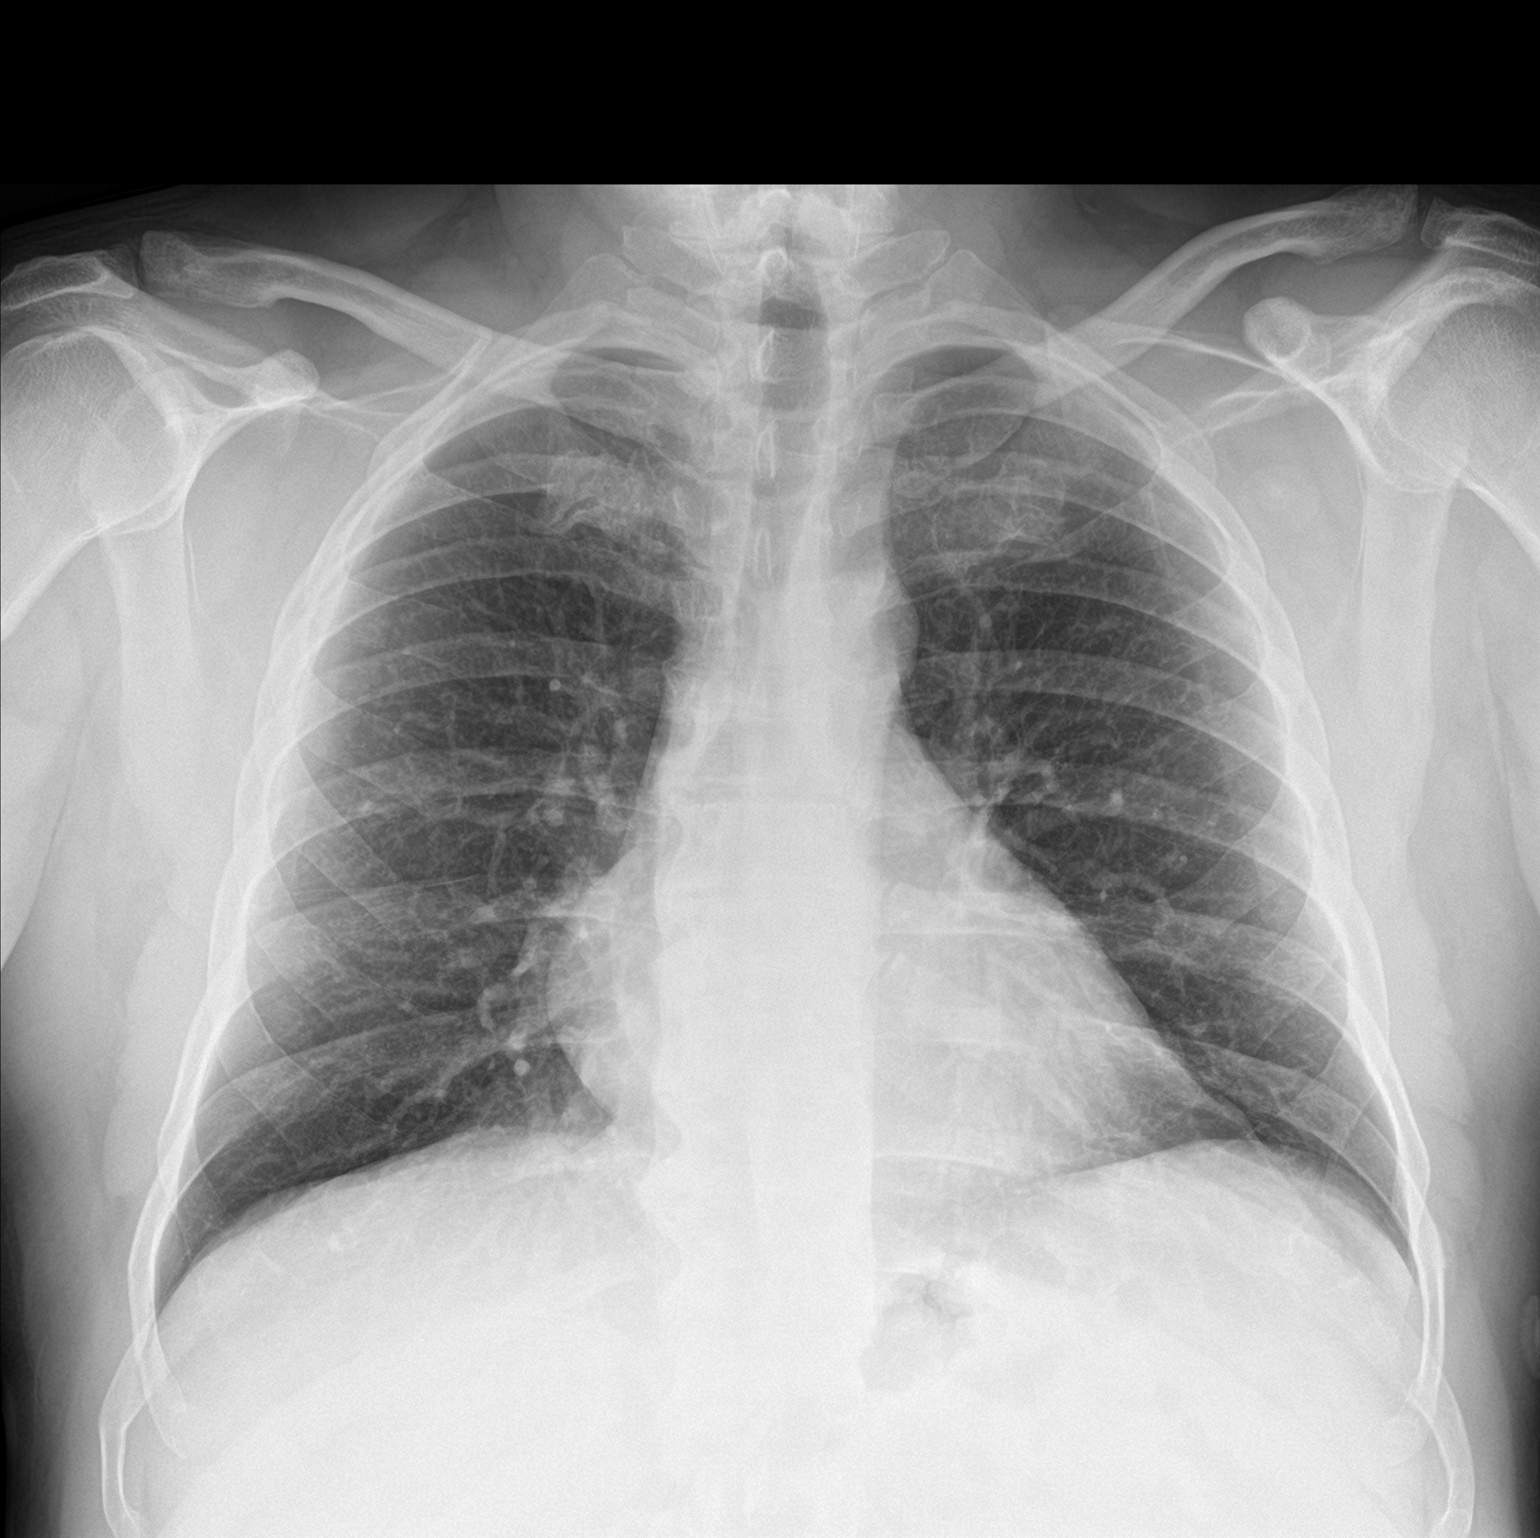

[chest lat]
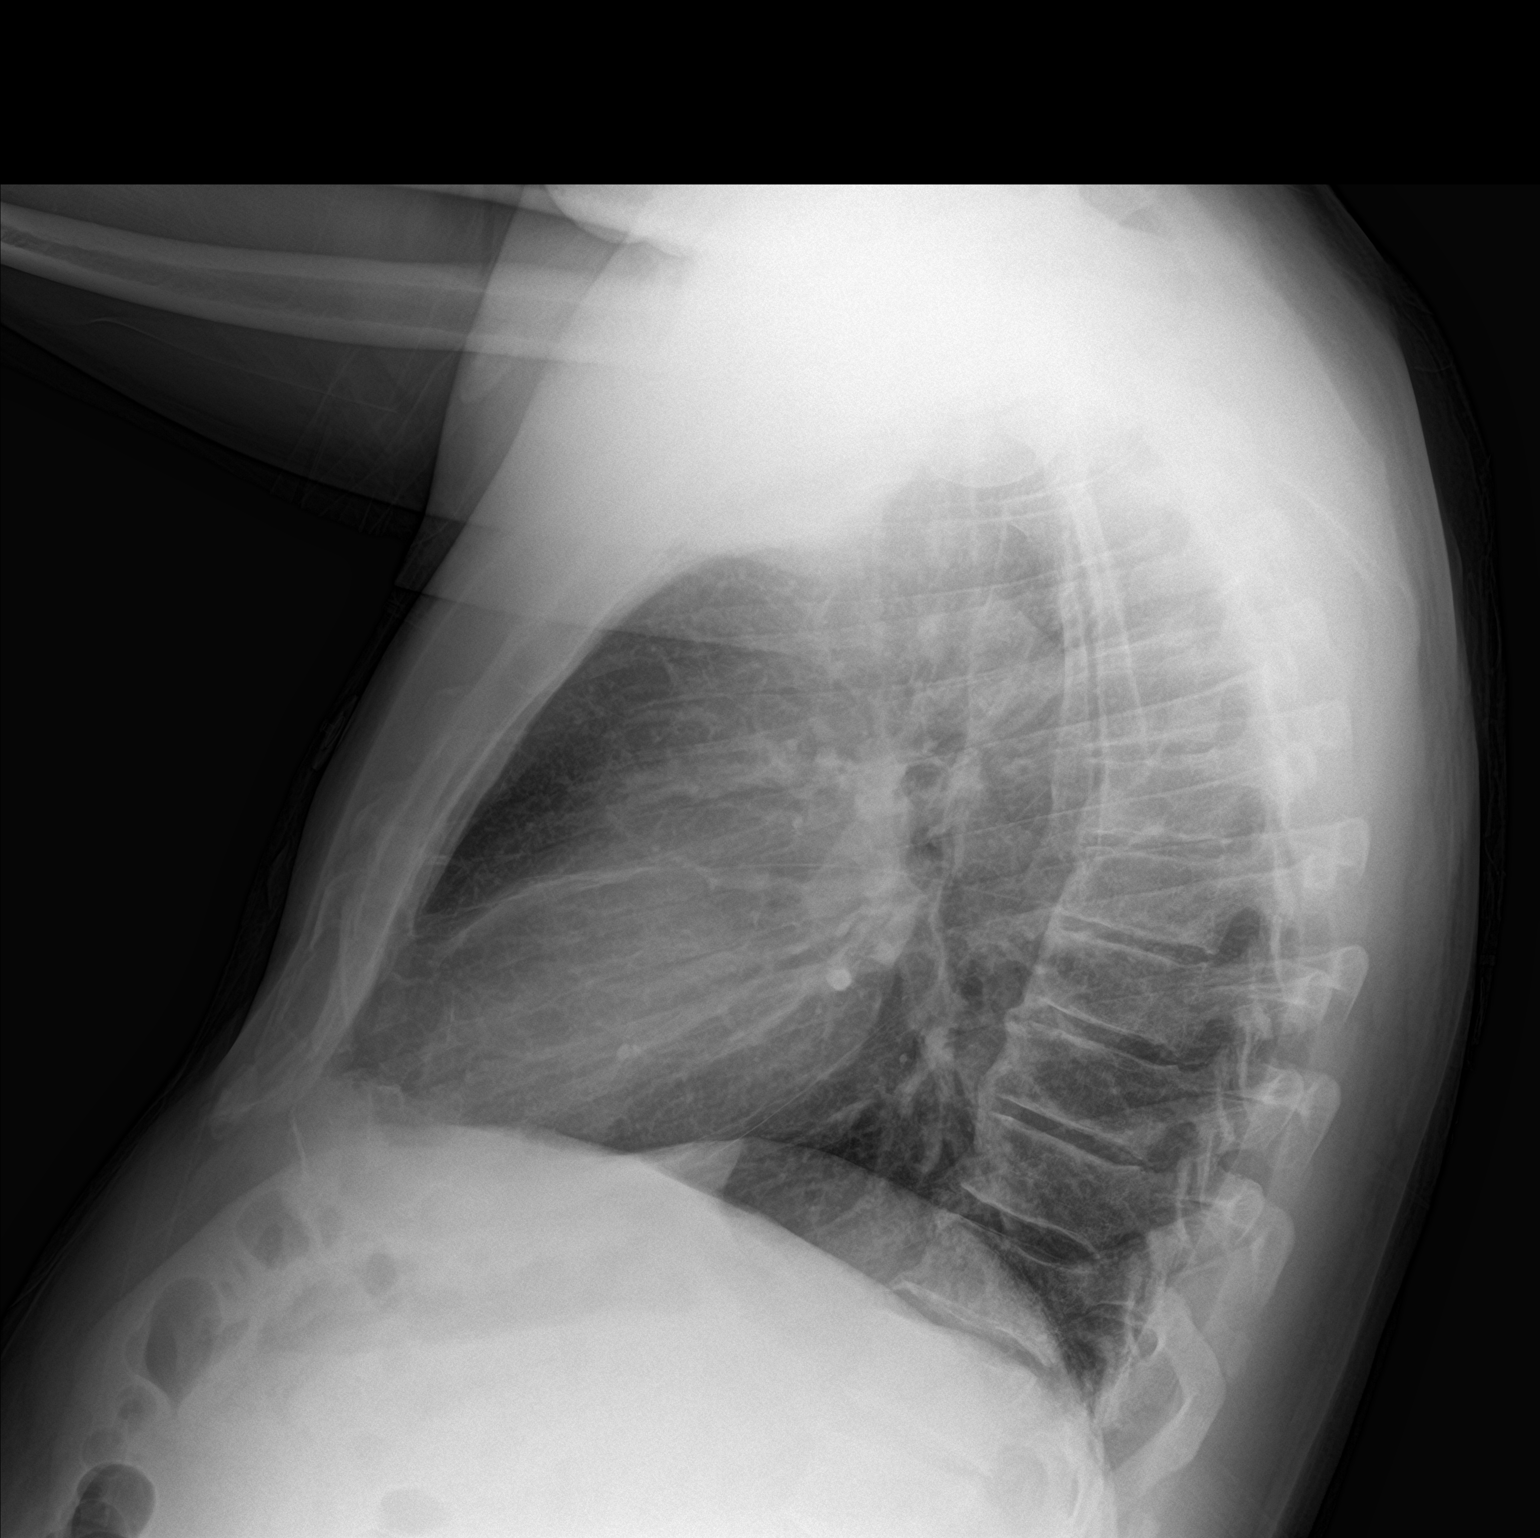

[2 of 2 positions shown; findings below may reference images not displayed]

FINDINGS: The heart size and mediastinal contours are within normal limits.
Both lungs are clear. The visualized skeletal structures are
unremarkable.
IMPRESSION: No active cardiopulmonary disease.

## 2021-12-05 ENCOUNTER — Emergency Department (HOSPITAL_COMMUNITY): Payer: 59

## 2021-12-05 ENCOUNTER — Emergency Department (HOSPITAL_COMMUNITY): Payer: Worker's Compensation

## 2021-12-05 ENCOUNTER — Encounter (HOSPITAL_COMMUNITY): Payer: Self-pay

## 2021-12-05 ENCOUNTER — Inpatient Hospital Stay (HOSPITAL_COMMUNITY)
Admission: EM | Admit: 2021-12-05 | Discharge: 2021-12-10 | DRG: 493 | Disposition: A | Discharge status: Home / Self Care | Payer: Worker's Compensation | Attending: Student | Admitting: Student

## 2021-12-05 ENCOUNTER — Inpatient Hospital Stay (HOSPITAL_COMMUNITY): Payer: 59

## 2021-12-05 ENCOUNTER — Other Ambulatory Visit: Payer: Self-pay

## 2021-12-05 ENCOUNTER — Emergency Department (HOSPITAL_COMMUNITY): Payer: Worker's Compensation | Admitting: Anesthesiology

## 2021-12-05 ENCOUNTER — Encounter (HOSPITAL_COMMUNITY)
Admission: EM | Disposition: A | Discharge status: Home / Self Care | Payer: Self-pay | Source: Home / Self Care | Attending: Student

## 2021-12-05 DIAGNOSIS — R339 Retention of urine, unspecified: Secondary | ICD-10-CM | POA: Diagnosis present

## 2021-12-05 DIAGNOSIS — S82832C Other fracture of upper and lower end of left fibula, initial encounter for open fracture type IIIA, IIIB, or IIIC: Secondary | ICD-10-CM | POA: Diagnosis present

## 2021-12-05 DIAGNOSIS — S82872B Displaced pilon fracture of left tibia, initial encounter for open fracture type I or II: Secondary | ICD-10-CM | POA: Diagnosis present

## 2021-12-05 DIAGNOSIS — S82872C Displaced pilon fracture of left tibia, initial encounter for open fracture type IIIA, IIIB, or IIIC: Secondary | ICD-10-CM | POA: Diagnosis not present

## 2021-12-05 DIAGNOSIS — Z23 Encounter for immunization: Secondary | ICD-10-CM | POA: Diagnosis not present

## 2021-12-05 DIAGNOSIS — W11XXXA Fall on and from ladder, initial encounter: Secondary | ICD-10-CM | POA: Diagnosis present

## 2021-12-05 DIAGNOSIS — D62 Acute posthemorrhagic anemia: Secondary | ICD-10-CM | POA: Diagnosis not present

## 2021-12-05 DIAGNOSIS — N35911 Unspecified urethral stricture, male, meatal: Secondary | ICD-10-CM | POA: Diagnosis present

## 2021-12-05 DIAGNOSIS — Z09 Encounter for follow-up examination after completed treatment for conditions other than malignant neoplasm: Secondary | ICD-10-CM

## 2021-12-05 DIAGNOSIS — Y990 Civilian activity done for income or pay: Secondary | ICD-10-CM

## 2021-12-05 DIAGNOSIS — R338 Other retention of urine: Secondary | ICD-10-CM | POA: Diagnosis present

## 2021-12-05 DIAGNOSIS — S82873A Displaced pilon fracture of unspecified tibia, initial encounter for closed fracture: Secondary | ICD-10-CM | POA: Diagnosis present

## 2021-12-05 DIAGNOSIS — N35919 Unspecified urethral stricture, male, unspecified site: Secondary | ICD-10-CM | POA: Diagnosis present

## 2021-12-05 DIAGNOSIS — S82871B Displaced pilon fracture of right tibia, initial encounter for open fracture type I or II: Secondary | ICD-10-CM | POA: Diagnosis not present

## 2021-12-05 LAB — CBC WITH DIFFERENTIAL/PLATELET
Abs Immature Granulocytes: 0.1 10*3/uL — ABNORMAL HIGH (ref 0.00–0.07)
Basophils Absolute: 0.1 10*3/uL (ref 0.0–0.1)
Basophils Relative: 1 %
Eosinophils Absolute: 0.2 10*3/uL (ref 0.0–0.5)
Eosinophils Relative: 2 %
HCT: 45.2 % (ref 39.0–52.0)
Hemoglobin: 16 g/dL (ref 13.0–17.0)
Immature Granulocytes: 1 %
Lymphocytes Relative: 22 %
Lymphs Abs: 2.4 10*3/uL (ref 0.7–4.0)
MCH: 29.9 pg (ref 26.0–34.0)
MCHC: 35.4 g/dL (ref 30.0–36.0)
MCV: 84.3 fL (ref 80.0–100.0)
Monocytes Absolute: 0.8 10*3/uL (ref 0.1–1.0)
Monocytes Relative: 7 %
Neutro Abs: 7.7 10*3/uL (ref 1.7–7.7)
Neutrophils Relative %: 67 %
Platelets: 269 10*3/uL (ref 150–400)
RBC: 5.36 MIL/uL (ref 4.22–5.81)
RDW: 12.6 % (ref 11.5–15.5)
WBC: 11.1 10*3/uL — ABNORMAL HIGH (ref 4.0–10.5)
nRBC: 0 % (ref 0.0–0.2)

## 2021-12-05 LAB — BASIC METABOLIC PANEL
Anion gap: 9 (ref 5–15)
BUN: 15 mg/dL (ref 8–23)
CO2: 23 mmol/L (ref 22–32)
Calcium: 8.5 mg/dL — ABNORMAL LOW (ref 8.9–10.3)
Chloride: 107 mmol/L (ref 98–111)
Creatinine, Ser: 1.66 mg/dL — ABNORMAL HIGH (ref 0.61–1.24)
GFR, Estimated: 47 mL/min — ABNORMAL LOW (ref >60)
Glucose, Bld: 107 mg/dL — ABNORMAL HIGH (ref 70–99)
Potassium: 3.8 mmol/L (ref 3.5–5.1)
Sodium: 139 mmol/L (ref 135–145)

## 2021-12-05 SURGERY — EXTERNAL FIXATION, ANKLE
Anesthesia: General | Laterality: Left

## 2021-12-05 MED ORDER — ACETAMINOPHEN 10 MG/ML IV SOLN
INTRAVENOUS | Status: DC | PRN
  Administered 2021-12-05: 1000 mg via INTRAVENOUS

## 2021-12-05 MED ORDER — ROCURONIUM BROMIDE 100 MG/10ML IV SOLN
INTRAVENOUS | Status: DC | PRN
  Administered 2021-12-05: 50 mg via INTRAVENOUS

## 2021-12-05 MED ORDER — HYDROMORPHONE HCL 1 MG/ML IJ SOLN: 1.0000 mg | Freq: Once | INTRAMUSCULAR | Status: DC

## 2021-12-05 MED ORDER — ENOXAPARIN SODIUM 40 MG/0.4ML IJ SOSY
40.0000 mg | PREFILLED_SYRINGE | INTRAMUSCULAR | Status: DC
  Administered 2021-12-06: 40 mg via SUBCUTANEOUS
  Filled 2021-12-05: qty 0.4

## 2021-12-05 MED ORDER — SODIUM CHLORIDE 0.9 % IV SOLN: Freq: Once | INTRAVENOUS | Status: AC

## 2021-12-05 MED ORDER — TETANUS-DIPHTH-ACELL PERTUSSIS 5-2.5-18.5 LF-MCG/0.5 IM SUSY
0.5000 mL | PREFILLED_SYRINGE | Freq: Once | INTRAMUSCULAR | Status: AC
  Administered 2021-12-05: 0.5 mL via INTRAMUSCULAR
  Filled 2021-12-05: qty 0.5

## 2021-12-05 MED ORDER — SUCCINYLCHOLINE CHLORIDE 20 MG/ML IJ SOLN
INTRAMUSCULAR | Status: DC | PRN
  Administered 2021-12-05: 120 mg via INTRAVENOUS

## 2021-12-05 MED ORDER — OXYCODONE HCL 5 MG PO TABS: 10.0000 mg | ORAL_TABLET | Freq: Four times a day (QID) | ORAL | Status: DC | PRN

## 2021-12-05 MED ORDER — CEFAZOLIN SODIUM-DEXTROSE 2-3 GM-%(50ML) IV SOLR
INTRAVENOUS | Status: DC | PRN
  Administered 2021-12-05: 2 g via INTRAVENOUS

## 2021-12-05 MED ORDER — OXYCODONE HCL 5 MG PO TABS
5.0000 mg | ORAL_TABLET | ORAL | Status: DC | PRN
  Administered 2021-12-06 (×3): 5 mg via ORAL
  Administered 2021-12-09 (×3): 10 mg via ORAL
  Filled 2021-12-05 (×2): qty 1
  Filled 2021-12-05 (×5): qty 2
  Filled 2021-12-05: qty 1

## 2021-12-05 MED ORDER — OXYCODONE HCL 5 MG PO TABS
10.0000 mg | ORAL_TABLET | ORAL | Status: DC | PRN
  Administered 2021-12-06 – 2021-12-09 (×9): 15 mg via ORAL
  Filled 2021-12-05 (×9): qty 3

## 2021-12-05 MED ORDER — CEFAZOLIN SODIUM-DEXTROSE 2-4 GM/100ML-% IV SOLN
INTRAVENOUS | Status: AC
  Filled 2021-12-05: qty 100

## 2021-12-05 MED ORDER — FENTANYL CITRATE (PF) 100 MCG/2ML IJ SOLN
25.0000 ug | INTRAMUSCULAR | Status: DC | PRN
  Administered 2021-12-05: 50 ug via INTRAVENOUS
  Administered 2021-12-05 (×2): 25 ug via INTRAVENOUS

## 2021-12-05 MED ORDER — DOCUSATE SODIUM 100 MG PO CAPS
100.0000 mg | ORAL_CAPSULE | Freq: Two times a day (BID) | ORAL | Status: DC
  Administered 2021-12-06 – 2021-12-10 (×6): 100 mg via ORAL
  Filled 2021-12-05 (×8): qty 1

## 2021-12-05 MED ORDER — FENTANYL CITRATE (PF) 100 MCG/2ML IJ SOLN
INTRAMUSCULAR | Status: AC
  Filled 2021-12-05: qty 2

## 2021-12-05 MED ORDER — PROPOFOL 10 MG/ML IV BOLUS
INTRAVENOUS | Status: DC | PRN
  Administered 2021-12-05: 170 mg via INTRAVENOUS

## 2021-12-05 MED ORDER — ACETAMINOPHEN 500 MG PO TABS: 1000.0000 mg | ORAL_TABLET | Freq: Once | ORAL | Status: DC | PRN

## 2021-12-05 MED ORDER — ORAL CARE MOUTH RINSE: 15.0000 mL | Freq: Once | OROMUCOSAL | Status: AC

## 2021-12-05 MED ORDER — ACETAMINOPHEN 10 MG/ML IV SOLN: 1000.0000 mg | Freq: Once | INTRAVENOUS | Status: DC | PRN

## 2021-12-05 MED ORDER — ACETAMINOPHEN 325 MG PO TABS: 325.0000 mg | ORAL_TABLET | Freq: Four times a day (QID) | ORAL | Status: DC | PRN

## 2021-12-05 MED ORDER — ONDANSETRON HCL 4 MG PO TABS: 4.0000 mg | ORAL_TABLET | Freq: Four times a day (QID) | ORAL | Status: DC | PRN

## 2021-12-05 MED ORDER — FENTANYL CITRATE PF 50 MCG/ML IJ SOSY
100.0000 ug | PREFILLED_SYRINGE | Freq: Once | INTRAMUSCULAR | Status: AC
  Administered 2021-12-05: 100 ug via INTRAVENOUS
  Filled 2021-12-05: qty 2

## 2021-12-05 MED ORDER — LACTATED RINGERS IV SOLN: INTRAVENOUS | Status: DC

## 2021-12-05 MED ORDER — SODIUM CHLORIDE 0.9 % IR SOLN
Status: DC | PRN
  Administered 2021-12-05 (×3): 1000 mL

## 2021-12-05 MED ORDER — MORPHINE SULFATE (PF) 4 MG/ML IV SOLN
4.0000 mg | INTRAVENOUS | Status: DC | PRN
  Administered 2021-12-05 (×2): 4 mg via INTRAVENOUS
  Filled 2021-12-05 (×2): qty 1

## 2021-12-05 MED ORDER — MIDAZOLAM HCL 2 MG/2ML IJ SOLN
INTRAMUSCULAR | Status: AC
  Filled 2021-12-05: qty 2

## 2021-12-05 MED ORDER — ONDANSETRON HCL 4 MG/2ML IJ SOLN
4.0000 mg | INTRAMUSCULAR | Status: DC | PRN
  Administered 2021-12-05 (×2): 4 mg via INTRAVENOUS
  Filled 2021-12-05 (×2): qty 2

## 2021-12-05 MED ORDER — FENTANYL CITRATE (PF) 100 MCG/2ML IJ SOLN
INTRAMUSCULAR | Status: DC | PRN
  Administered 2021-12-05: 100 ug via INTRAVENOUS
  Administered 2021-12-05: 50 ug via INTRAVENOUS

## 2021-12-05 MED ORDER — SODIUM CHLORIDE 0.9 % IV SOLN
INTRAVENOUS | Status: DC
Start: 2021-12-06 — End: 2021-12-10

## 2021-12-05 MED ORDER — LIDOCAINE HCL (CARDIAC) PF 50 MG/5ML IV SOSY
PREFILLED_SYRINGE | INTRAVENOUS | Status: DC | PRN
  Administered 2021-12-05: 80 mg via INTRAVENOUS

## 2021-12-05 MED ORDER — ACETAMINOPHEN 160 MG/5ML PO SOLN: 1000.0000 mg | Freq: Once | ORAL | Status: DC | PRN

## 2021-12-05 MED ORDER — MORPHINE SULFATE (PF) 4 MG/ML IV SOLN
4.0000 mg | Freq: Once | INTRAVENOUS | Status: AC
  Administered 2021-12-05: 4 mg via INTRAVENOUS
  Filled 2021-12-05: qty 1

## 2021-12-05 MED ORDER — CHLORHEXIDINE GLUCONATE 0.12 % MT SOLN
15.0000 mL | Freq: Once | OROMUCOSAL | Status: AC
Start: 2021-12-05 — End: 2021-12-05
  Administered 2021-12-05: 15 mL via OROMUCOSAL

## 2021-12-05 MED ORDER — FENTANYL CITRATE (PF) 250 MCG/5ML IJ SOLN
INTRAMUSCULAR | Status: AC
  Filled 2021-12-05: qty 5

## 2021-12-05 MED ORDER — HYDROMORPHONE HCL 1 MG/ML IJ SOLN
0.5000 mg | INTRAMUSCULAR | Status: DC | PRN
  Administered 2021-12-06 – 2021-12-09 (×8): 1 mg via INTRAVENOUS
  Filled 2021-12-05 (×8): qty 1

## 2021-12-05 MED ORDER — KETOROLAC TROMETHAMINE 15 MG/ML IJ SOLN: 15.0000 mg | Freq: Once | INTRAMUSCULAR | Status: DC

## 2021-12-05 MED ORDER — OXYCODONE HCL 5 MG/5ML PO SOLN: 5.0000 mg | Freq: Once | ORAL | Status: DC | PRN

## 2021-12-05 MED ORDER — ACETAMINOPHEN 500 MG PO TABS
1000.0000 mg | ORAL_TABLET | Freq: Four times a day (QID) | ORAL | Status: AC
  Administered 2021-12-06 (×2): 1000 mg via ORAL
  Filled 2021-12-05 (×3): qty 2

## 2021-12-05 MED ORDER — LACTATED RINGERS IV SOLN: INTRAVENOUS | Status: DC | PRN

## 2021-12-05 MED ORDER — DEXAMETHASONE SODIUM PHOSPHATE 4 MG/ML IJ SOLN
INTRAMUSCULAR | Status: DC | PRN
  Administered 2021-12-05: 10 mg via INTRAVENOUS

## 2021-12-05 MED ORDER — ONDANSETRON HCL 4 MG/2ML IJ SOLN: 4.0000 mg | Freq: Four times a day (QID) | INTRAMUSCULAR | Status: DC | PRN

## 2021-12-05 MED ORDER — ACETAMINOPHEN 10 MG/ML IV SOLN
INTRAVENOUS | Status: AC
  Filled 2021-12-05: qty 100

## 2021-12-05 MED ORDER — OXYCODONE HCL 5 MG PO TABS: 5.0000 mg | ORAL_TABLET | Freq: Once | ORAL | Status: DC | PRN

## 2021-12-05 MED ORDER — PROPOFOL 10 MG/ML IV BOLUS
INTRAVENOUS | Status: AC
  Filled 2021-12-05: qty 20

## 2021-12-05 MED ORDER — SUGAMMADEX SODIUM 200 MG/2ML IV SOLN
INTRAVENOUS | Status: DC | PRN
  Administered 2021-12-05: 200 mg via INTRAVENOUS

## 2021-12-05 MED ORDER — LACTATED RINGERS IV SOLN
INTRAVENOUS | Status: DC
Start: 2021-12-05 — End: 2021-12-05

## 2021-12-05 SURGICAL SUPPLY — 74 items
APL PRP STRL LF DISP 70% ISPRP (MISCELLANEOUS) ×1
BAG COUNTER SPONGE SURGICOUNT (BAG) ×2 IMPLANT
BAG SPNG CNTER NS LX DISP (BAG) ×1
BANDAGE ESMARK 6X9 LF (GAUZE/BANDAGES/DRESSINGS) ×1 IMPLANT
BAR EXFX 200X11 NS LF (EXFIX) ×1
BAR EXFX 250X11 NS LF (EXFIX) ×1
BAR EXFX 350X11 NS LF (EXFIX) ×2
BAR GLASS FIBER EXFX 11X200 (EXFIX) ×1 IMPLANT
BAR GLASS FIBER EXFX 11X250 (EXFIX) ×1 IMPLANT
BAR GLASS FIBER EXFX 11X350 (EXFIX) ×2 IMPLANT
BIT DRILL CANN MED FLUTE 4.0 (BIT) IMPLANT
BNDG CMPR 9X6 STRL LF SNTH (GAUZE/BANDAGES/DRESSINGS) ×1
BNDG CMPR MED 10X6 ELC LF (GAUZE/BANDAGES/DRESSINGS) ×1
BNDG COHESIVE 4X5 TAN STRL (GAUZE/BANDAGES/DRESSINGS) ×2 IMPLANT
BNDG ELASTIC 4X5.8 VLCR STR LF (GAUZE/BANDAGES/DRESSINGS) IMPLANT
BNDG ELASTIC 6X10 VLCR STRL LF (GAUZE/BANDAGES/DRESSINGS) ×1 IMPLANT
BNDG ELASTIC 6X5.8 VLCR STR LF (GAUZE/BANDAGES/DRESSINGS) IMPLANT
BNDG ESMARK 6X9 LF (GAUZE/BANDAGES/DRESSINGS) ×2
BRUSH SCRUB EZ PLAIN DRY (MISCELLANEOUS) ×4 IMPLANT
CAP PROTECTIVE TRANSFX 4.5X5MM (EXFIX) ×1 IMPLANT
CHLORAPREP W/TINT 26 (MISCELLANEOUS) ×2 IMPLANT
CLAMP BLUE BAR TO BAR (EXFIX) ×3 IMPLANT
CLAMP BLUE BAR TO PIN (EXFIX) ×3 IMPLANT
COVER SURGICAL LIGHT HANDLE (MISCELLANEOUS) ×2 IMPLANT
DRAPE C-ARM 42X72 X-RAY (DRAPES) ×2 IMPLANT
DRAPE C-ARMOR (DRAPES) ×2 IMPLANT
DRAPE ORTHO SPLIT 77X108 STRL (DRAPES) ×4
DRAPE SURG ORHT 6 SPLT 77X108 (DRAPES) ×2 IMPLANT
DRAPE U-SHAPE 47X51 STRL (DRAPES) ×2 IMPLANT
DRILL CANN 4.0MM (BIT) ×2
DRSG ADAPTIC 3X8 NADH LF (GAUZE/BANDAGES/DRESSINGS) IMPLANT
ELECT REM PT RETURN 9FT ADLT (ELECTROSURGICAL) ×2
ELECTRODE REM PT RTRN 9FT ADLT (ELECTROSURGICAL) ×1 IMPLANT
GAUZE SPONGE 4X4 12PLY STRL (GAUZE/BANDAGES/DRESSINGS) IMPLANT
GAUZE XEROFORM 5X9 LF (GAUZE/BANDAGES/DRESSINGS) ×1 IMPLANT
GLOVE BIO SURGEON STRL SZ 6.5 (GLOVE) ×6 IMPLANT
GLOVE BIO SURGEON STRL SZ7.5 (GLOVE) ×6 IMPLANT
GLOVE BIOGEL PI IND STRL 6.5 (GLOVE) ×1 IMPLANT
GLOVE BIOGEL PI IND STRL 7.5 (GLOVE) ×1 IMPLANT
GLOVE BIOGEL PI INDICATOR 6.5 (GLOVE) ×1
GLOVE BIOGEL PI INDICATOR 7.5 (GLOVE) ×1
GOWN STRL REUS W/ TWL LRG LVL3 (GOWN DISPOSABLE) ×2 IMPLANT
GOWN STRL REUS W/TWL LRG LVL3 (GOWN DISPOSABLE) ×4
KIT TURNOVER KIT B (KITS) ×2 IMPLANT
MANIFOLD NEPTUNE II (INSTRUMENTS) ×2 IMPLANT
NDL HYPO 21X1.5 SAFETY (NEEDLE) IMPLANT
NDL HYPO 25GX1X1/2 BEV (NEEDLE) ×1 IMPLANT
NEEDLE HYPO 21X1.5 SAFETY (NEEDLE) IMPLANT
NEEDLE HYPO 25GX1X1/2 BEV (NEEDLE) ×2 IMPLANT
NS IRRIG 1000ML POUR BTL (IV SOLUTION) ×2 IMPLANT
PACK TOTAL JOINT (CUSTOM PROCEDURE TRAY) ×2 IMPLANT
PAD ARMBOARD 7.5X6 YLW CONV (MISCELLANEOUS) ×4 IMPLANT
PAD CAST 4YDX4 CTTN HI CHSV (CAST SUPPLIES) IMPLANT
PADDING CAST COTTON 4X4 STRL (CAST SUPPLIES)
PADDING CAST COTTON 6X4 STRL (CAST SUPPLIES) IMPLANT
PIN 4X100X20MM (EXFIX) ×1 IMPLANT
PIN CLAMP 2BAR 75MM BLUE (EXFIX) ×1 IMPLANT
PIN HALF YELLOW 5X160X35 (EXFIX) ×2 IMPLANT
PIN TRANSFIXING 5.0 (EXFIX) ×1 IMPLANT
SPONGE T-LAP 18X18 ~~LOC~~+RFID (SPONGE) IMPLANT
STAPLER VISISTAT 35W (STAPLE) ×2 IMPLANT
SUCTION FRAZIER HANDLE 10FR (MISCELLANEOUS) ×2
SUCTION TUBE FRAZIER 10FR DISP (MISCELLANEOUS) ×1 IMPLANT
SUT ETHILON 3 0 PS 1 (SUTURE) ×4 IMPLANT
SUT PROLENE 0 CT (SUTURE) IMPLANT
SUT VIC AB 0 CT1 27 (SUTURE) ×2
SUT VIC AB 0 CT1 27XBRD ANBCTR (SUTURE) ×1 IMPLANT
SUT VIC AB 2-0 CT1 27 (SUTURE) ×4
SUT VIC AB 2-0 CT1 TAPERPNT 27 (SUTURE) ×2 IMPLANT
SYR CONTROL 10ML LL (SYRINGE) ×2 IMPLANT
TOWEL GREEN STERILE (TOWEL DISPOSABLE) ×4 IMPLANT
TOWEL GREEN STERILE FF (TOWEL DISPOSABLE) ×2 IMPLANT
UNDERPAD 30X36 HEAVY ABSORB (UNDERPADS AND DIAPERS) ×2 IMPLANT
WATER STERILE IRR 1000ML POUR (IV SOLUTION) ×2 IMPLANT

## 2021-12-05 NOTE — Brief Op Note (Signed)
   Brief Op Note  Date of Surgery: 12/05/2021  Preoperative Diagnosis: Left Pilon fx  Postoperative Diagnosis: same  Procedure: Procedure(s): LEFT EXTERNAL FIXATION OF PILON FRACTURE POSSIBLE ORIF  Implants: * No implants in log *  Surgeons: Surgeon(s): Huel Cote, MD  Anesthesia: Choice    Estimated Blood Loss: See anesthesia record  Complications: None  Condition to PACU: Stable  Benancio Deeds, MD 12/05/2021 9:00 PM

## 2021-12-05 NOTE — ED Triage Notes (Signed)
BIB [facility] after a co worker from work called to report pt having fallen roughly 8 feet from the later. Pt sustained open fx to left leg. Pt given 200 mcg of fentanyl en route and 2 G of Ancef.

## 2021-12-05 NOTE — ED Notes (Signed)
Patient reports that he will let the RN know when his pain increases above an 8 so that he may receive his pain medications. Wife at bedside also verbalizes understanding of set pain goal

## 2021-12-05 NOTE — ED Notes (Signed)
Patient transported to CT 

## 2021-12-05 NOTE — Anesthesia Postprocedure Evaluation (Signed)
Anesthesia Post Note  Patient: Kenneth Calhoun  Procedure(s) Performed: LEFT EXTERNAL FIXATION OF PILON FRACTURE POSSIBLE ORIF (Left)     Patient location during evaluation: PACU Anesthesia Type: General Level of consciousness: awake and alert Pain management: pain level controlled Vital Signs Assessment: post-procedure vital signs reviewed and stable Respiratory status: spontaneous breathing, nonlabored ventilation, respiratory function stable and patient connected to nasal cannula oxygen Cardiovascular status: blood pressure returned to baseline and stable Postop Assessment: no apparent nausea or vomiting Anesthetic complications: no   No notable events documented.  Last Vitals:  Vitals:   12/05/21 2300 12/05/21 2331  BP: 125/82 136/71  Pulse: 90 91  Resp: 13   Temp:  36.7 C  SpO2: 93% 94%    Last Pain:  Vitals:   12/05/21 2331  TempSrc: Oral  PainSc:                  [name]

## 2021-12-05 NOTE — ED Notes (Signed)
Patient shoes and socks cut off provider notified of open fracture of the left ankle. Patient has motor sensation and pulses intact on both left and right foot. Patient is very painful to touch or move the left foot or ankle.Patient's left leg remains in a splint from EMS and has moistened gauze under dry gauze placed on the open fracture. Patient and family updated on the plan of care at this time and verbalize understanding.

## 2021-12-05 NOTE — Op Note (Signed)
Date of Surgery: 12/05/2021  INDICATIONS: Kenneth Calhoun is a 61 y.o.-year-old male with a grade 3 open Pilon fracture of the left leg.  He presented to the hospital and was taken for urgent external fixation and irrigation and debridement.  The risk and benefits of the procedure were discussed in detail and documented in the pre-operative evaluation.   PREOPERATIVE DIAGNOSIS: 1.  Left open grade 3 pilon fracture  POSTOPERATIVE DIAGNOSIS: Same.  PROCEDURE: 1.  Irrigation debridement left open fracture of the distal tibia 2.  Placement of multiplanar external fixator for staged fixation  SURGEON: Kenneth Deeds MD  ASSISTANT: Kerby Less, ATC  ANESTHESIA:  general  IV FLUIDS AND URINE: See anesthesia record.  ANTIBIOTICS: Ancef  ESTIMATED BLOOD LOSS: 50 mL.  IMPLANTS:  Zimmer external fixator size 4 pins for the tibia and calcaneus as well as a intercuneiform and  DRAINS: None  CULTURES: None  COMPLICATIONS: none  DESCRIPTION OF PROCEDURE:   The patient was identified in the preoperative holding area.  The correct site was marked according to universal protocol with nursing.  Again the risk and benefits of surgery were discussed and the patient elected proceed.  He was taken back to the operating room.  He was positioned supine on the bed.  All bony prominences were padded.  The leg was prepped and draped in usual sterile fashion.  Final timeout was performed.  A systematic debridement ensued.  Using Metzenbaum scissors dissection was carried down layer to layer to the level of bone to debride any nonviable tissue.  Curette was used to clean the exposed edges of the bone and the wound was irrigated with 6 L of normal saline until all tissue appeared healthy and viable.  The fracture was manually reduced with inline traction.  At this time an external fixation was performed.  2 pins were placed in the midshaft of the tibia into the anterior lateral tibia.  The guide for the  outrigger was used.  Skin was incised with 15 blade and sample spread down to bone.  The proximal cortex was then drilled and to size for partially-threaded pins were placed bicortically.  AP and lateral fluoroscopy confirmed this.  The calcaneal transfixion pin was then placed.  15 blade was used to incise over the medial aspect of the calcaneus.  A snap was used to spread down to bone.  The pin was placed across the mid body of the calcaneus.  Finally using AP and lateral fluoroscopy a cuneiform pin was placed through the central aspect of the medial cuneiform into the middle cuneiform.  Inline traction was then again held and the bar to bar and pin to bar clamps were assembled with the bars.  A delta frame construct was used.  AP and lateral fluoroscopy confirmed that the fracture was out to length.  The wound of compound was no longer on tension with exposed bone.  This was loosely closed with 3-0 nylon.  Dressings on the Ex-Fix were placed with Xeroform gauze and Webril.  He was awoken and taken the PACU without complication.  He had a strong 2+ pulse with warm and well-perfused toes following the case.    POSTOPERATIVE PLAN: The patient will be nonweightbearing on the left lower extremity.  He will be plan to seen by Dr. Jena Gauss in the a.m. for a swelling check.  I will plan to consult the orthopedic trauma team in-house for their expert fixation definitively of the pilon fracture.  He will receive  an additional 2 doses of antibiotics.  He will be seen by physical therapy.  Kenneth Deeds, MD 9:00 PM

## 2021-12-05 NOTE — Consult Note (Signed)
Urology Consult  Referring physician: Serena Croissant Reason for referral: Retention  Chief Complaint: Retention  History of Present Illness: I was consulted to assess the patient postoperatively urinary retention and able to place a catheter.  History was a bit challenging because the patient was still medicated.  He said normally his flow is reasonable.  He voids every 2 hours and gets up twice a night.  He felt that he was full but could not void.  Initially he said he did not have previous genitourinary surgery but after examination he describes perhaps some stricture surgery in the past or perhaps a dilation.  He could not be more specific in previous surgery.  I reviewed the medical record.  He does not get bladder infections.  Past Medical History:  Diagnosis Date   Gastritis    Past Surgical History:  Procedure Laterality Date   APPENDECTOMY      Medications: I have reviewed the patient's current medications. Allergies: No Known Allergies  History reviewed. No pertinent family history. Social History:  reports that he has never smoked. He has never used smokeless tobacco. He reports that he does not drink alcohol and does not use drugs.  ROS: All systems are reviewed and negative except as noted. Rest negative  Physical Exam:  Vital signs in last 24 hours: Temp:  [97 F (36.1 C)-98 F (36.7 C)] 97 F (36.1 C) (07/26 2121) Pulse Rate:  [75-92] 77 (07/26 2145) Resp:  [10-18] 13 (07/26 2145) BP: (124-173)/(67-102) 152/76 (07/26 2145) SpO2:  [93 %-100 %] 96 % (07/26 2145) Weight:  [98.4 kg] 98.4 kg (07/26 1909)  Cardiovascular: Skin warm; not flushed Respiratory: Breaths quiet; no shortness of breath Abdomen: No masses Neurological: Normal sensation to touch Musculoskeletal: Normal motor function arms and legs Lymphatics: No inguinal adenopathy Skin: No rashes Genitourinary: Patient had a very small meatus in the correct position on the glans penis but it was only a  dimple.  It looks like he may have another urethral orifice along the distal penile shaft approximately 1.5 cm proximal.  The rest the penile shaft was normal.  Laboratory Data:  Results for orders placed or performed during the hospital encounter of 12/05/21 (from the past 72 hour(s))  CBC with Differential     Status: Abnormal   Collection Time: 12/05/21  2:39 PM  Result Value Ref Range   WBC 11.1 (H) 4.0 - 10.5 K/uL   RBC 5.36 4.22 - 5.81 MIL/uL   Hemoglobin 16.0 13.0 - 17.0 g/dL   HCT 81.1 91.4 - 78.2 %   MCV 84.3 80.0 - 100.0 fL   MCH 29.9 26.0 - 34.0 pg   MCHC 35.4 30.0 - 36.0 g/dL   RDW 95.6 21.3 - 08.6 %   Platelets 269 150 - 400 K/uL   nRBC 0.0 0.0 - 0.2 %   Neutrophils Relative % 67 %   Neutro Abs 7.7 1.7 - 7.7 K/uL   Lymphocytes Relative 22 %   Lymphs Abs 2.4 0.7 - 4.0 K/uL   Monocytes Relative 7 %   Monocytes Absolute 0.8 0.1 - 1.0 K/uL   Eosinophils Relative 2 %   Eosinophils Absolute 0.2 0.0 - 0.5 K/uL   Basophils Relative 1 %   Basophils Absolute 0.1 0.0 - 0.1 K/uL   Immature Granulocytes 1 %   Abs Immature Granulocytes 0.10 (H) 0.00 - 0.07 K/uL    Comment: Performed at [facility], [address], [place], [place] [address]  Basic metabolic panel  Status: Abnormal   Collection Time: 12/05/21  2:39 PM  Result Value Ref Range   Sodium 139 135 - 145 mmol/L   Potassium 3.8 3.5 - 5.1 mmol/L   Chloride 107 98 - 111 mmol/L   CO2 23 22 - 32 mmol/L   Glucose, Bld 107 (H) 70 - 99 mg/dL    Comment: Glucose reference range applies only to samples taken after fasting for at least 8 hours.   BUN 15 8 - 23 mg/dL   Creatinine, Ser 1.85 (H) 0.61 - 1.24 mg/dL   Calcium 8.5 (L) 8.9 - 10.3 mg/dL   GFR, Estimated 47 (L) >60 mL/min    Comment: (NOTE) Calculated using the CKD-EPI Creatinine Equation (2021)    Anion gap 9 5 - 15    Comment: Performed at [facility], [address], [place], [place] [address]   No results found for this or any previous visit  (from the past 240 hour(s)). Creatinine: Recent Labs    12/05/21 1439  CREATININE 1.66*    Xrays: See report/chart None  Impression/Assessment:  Patient has a significant meatal stenosis.  Initially I was concerned that he may have had a distal urethroplasty but when he name the surgeon I thought he likely just had some meatal stenosis treated 25 years ago.  I gently placed a 6 [place] well-lubricated ureteral catheter through the meatus and the glans in the normal position.  I then took male sounds and started with #10 through #18 but only used the distal portion to slowly dilate his meatus.  He tolerated beautifully.  It was atraumatic.  I never used the entire curve of the sound.  There was no bleeding.  I then could pass a 14 [place] coud catheter easily into the bladder.  Urine output was good and clear.  Urine output was less than 300 mL  Plan:  Will see patient tomorrow DO NOT REMOVE THE CATHETER UNTIL OK WITH UROLOGY Very good prognosis to void once ambulatory  Allan Bacigalupi A Shanea Karney 12/05/2021, 10:14 PM

## 2021-12-05 NOTE — ED Notes (Signed)
Patient transported to X-ray 

## 2021-12-05 NOTE — ED Provider Notes (Addendum)
  Provider Note MRN:  [id number]  Arrival date & time: 12/05/21    ED Course and Medical Decision Making  Assumed care from [facility] at shift change.  See note from prior team for complete details, in brief:  61 year old male presents to the ED with fall from ladder. Open lower extremity fracture, tib-fib Patient was given Ancef in route. Tetanus updated in the ED CT reviewed, I viewed images, interpreted results/agree w/ radiologist interpretation; open tib/fib fx Labs reviewed Dr. Steward Drone with orthopedics to take patient to the OR tonight for repair Plan to admit Ortho    .Critical Care  Performed by: Sloan Leiter, DO Authorized by: Sloan Leiter, DO   Critical care provider statement:    Critical care time (minutes):  30   Critical care time was exclusive of:  Separately billable procedures and treating other patients   Critical care was necessary to treat or prevent imminent or life-threatening deterioration of the following conditions:  Trauma   Critical care was time spent personally by me on the following activities:  Development of treatment plan with patient or surrogate, discussions with consultants, evaluation of patient's response to treatment, examination of patient, ordering and review of laboratory studies, ordering and review of radiographic studies, ordering and performing treatments and interventions, pulse oximetry, re-evaluation of patient's condition, review of old charts and obtaining history from patient or surrogate   Care discussed with: admitting provider     Final Clinical Impressions(s) / ED Diagnoses     ICD-10-CM   1. Type III open displaced pilon fracture of left tibia, initial encounter  S82.872C     2. Postop check  Q46       ED Discharge Orders     None       Discharge Instructions   None        Sloan Leiter, DO 12/05/21 2309    Sloan Leiter, DO 12/05/21 2310

## 2021-12-05 NOTE — Interval H&P Note (Signed)
History and Physical Interval Note:  12/05/2021 6:00 PM  Kenneth Calhoun  has presented today for surgery, with the diagnosis of Left Pilon fx.  The various methods of treatment have been discussed with the patient and family. After consideration of risks, benefits and other options for treatment, the patient has consented to  Procedure(s): LEFT EXTERNAL FIXATION OF PILON FRACTURE POSSIBLE ORIF (Left) as a surgical intervention.  The patient's history has been reviewed, patient examined, no change in status, stable for surgery.  I have reviewed the patient's chart and labs.  Questions were answered to the patient's satisfaction.     Huel Cote

## 2021-12-05 NOTE — ED Notes (Signed)
This Rn touches base with the provider at this time regarding the concern for infection risk due to open fracture via secure chat.

## 2021-12-05 NOTE — H&P (Signed)
ORTHOPAEDIC CONSULTATION  Chief Complaint: Left open pilon fracture  HPI: Kenneth Calhoun is a 61 y.o. male who presents with with a left open pilon fracture after a fall 12 feet down a ladder.  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  He is otherwise healthy and does not have any significant medical history.  He is a non-smoker.  He is quite active on his feet daily at his job.  Denies any numbness in the toes at this time.  He initially presented to the [facility] emergency room this afternoon and was immediately given antibiotics.  Orthopedics was consulted for definitive management.  Past Medical History:  Diagnosis Date   Gastritis    Past Surgical History:  Procedure Laterality Date   APPENDECTOMY     Social History   Socioeconomic History   Marital status: Married    Spouse name: Not on file   Number of children: Not on file   Years of education: Not on file   Highest education level: Not on file  Occupational History   Not on file  Tobacco Use   Smoking status: Never   Smokeless tobacco: Never  Substance and Sexual Activity   Alcohol use: No   Drug use: No   Sexual activity: Yes  Other Topics Concern   Not on file  Social History Narrative   Not on file   Social Determinants of Health   Financial Resource Strain: Not on file  Food Insecurity: Not on file  Transportation Needs: Not on file  Physical Activity: Not on file  Stress: Not on file  Social Connections: Not on file   History reviewed. No pertinent family history. - negative except otherwise stated in the family history section No Known Allergies Prior to Admission medications   Medication Sig Start Date End Date Taking? Authorizing Provider  diphenhydramine-acetaminophen (TYLENOL PM) 25-500 MG TABS tablet Take 1 tablet by mouth at bedtime as needed (sleep).   Yes [provider]   CT Head Wo Contrast  Result Date: 12/05/2021 CLINICAL DATA:   Fall from ladder. EXAM: CT HEAD WITHOUT CONTRAST CT CERVICAL SPINE WITHOUT CONTRAST TECHNIQUE: Multidetector CT imaging of the head and cervical spine was performed following the standard protocol without intravenous contrast. Multiplanar CT image reconstructions of the cervical spine were also generated. RADIATION DOSE REDUCTION: This exam was performed according to the departmental dose-optimization program which includes automated exposure control, adjustment of the mA and/or kV according to patient size and/or use of iterative reconstruction technique. COMPARISON:  None Available. FINDINGS: CT HEAD FINDINGS Brain: There is no acute intracranial hemorrhage, extra-axial fluid collection, or acute infarct. Parenchymal volume is normal. The ventricles are normal in size. Gray-white differentiation is preserved There is no mass lesion.  There is no mass effect or midline shift. Vascular: No hyperdense vessel or unexpected calcification. Skull: Normal. Negative for fracture or focal lesion. Sinuses/Orbits: The imaged paranasal sinuses are clear. The globes and orbits are unremarkable. Other: None. CT CERVICAL SPINE FINDINGS Alignment: Normal. There is no jumped or perched facet or other evidence of traumatic malalignment. Skull base and vertebrae: Skull base alignment is maintained. Vertebral body heights are preserved. There is no evidence of acute fracture. Soft tissues and spinal canal: No prevertebral fluid or swelling. No visible canal hematoma. Disc levels: There is overall mild degenerative change throughout the cervical spine. There is no evidence of high-grade spinal canal or neural foraminal stenosis. Upper chest:  The imaged lung apices are clear. Other: None. IMPRESSION: 1. No acute intracranial hemorrhage or calvarial fracture. 2. No acute fracture or traumatic malalignment of the cervical spine. Electronically Signed   By: Lesia Hausen M.D.   On: 12/05/2021 16:09   CT Cervical Spine Wo  Contrast  Result Date: 12/05/2021 CLINICAL DATA:  Fall from ladder. EXAM: CT HEAD WITHOUT CONTRAST CT CERVICAL SPINE WITHOUT CONTRAST TECHNIQUE: Multidetector CT imaging of the head and cervical spine was performed following the standard protocol without intravenous contrast. Multiplanar CT image reconstructions of the cervical spine were also generated. RADIATION DOSE REDUCTION: This exam was performed according to the departmental dose-optimization program which includes automated exposure control, adjustment of the mA and/or kV according to patient size and/or use of iterative reconstruction technique. COMPARISON:  None Available. FINDINGS: CT HEAD FINDINGS Brain: There is no acute intracranial hemorrhage, extra-axial fluid collection, or acute infarct. Parenchymal volume is normal. The ventricles are normal in size. Gray-white differentiation is preserved There is no mass lesion.  There is no mass effect or midline shift. Vascular: No hyperdense vessel or unexpected calcification. Skull: Normal. Negative for fracture or focal lesion. Sinuses/Orbits: The imaged paranasal sinuses are clear. The globes and orbits are unremarkable. Other: None. CT CERVICAL SPINE FINDINGS Alignment: Normal. There is no jumped or perched facet or other evidence of traumatic malalignment. Skull base and vertebrae: Skull base alignment is maintained. Vertebral body heights are preserved. There is no evidence of acute fracture. Soft tissues and spinal canal: No prevertebral fluid or swelling. No visible canal hematoma. Disc levels: There is overall mild degenerative change throughout the cervical spine. There is no evidence of high-grade spinal canal or neural foraminal stenosis. Upper chest: The imaged lung apices are clear. Other: None. IMPRESSION: 1. No acute intracranial hemorrhage or calvarial fracture. 2. No acute fracture or traumatic malalignment of the cervical spine. Electronically Signed   By: Lesia Hausen M.D.   On:  12/05/2021 16:09   DG Tibia/Fibula Left  Result Date: 12/05/2021 CLINICAL DATA:  Larey Seat 8 feet from ladder. Open fracture of the left leg. EXAM: LEFT KNEE - 1-2 VIEW; LEFT TIBIA AND FIBULA - 2 VIEW; LEFT FEMUR PORTABLE 2 VIEWS COMPARISON:  None Available. FINDINGS: Left femur: The left femoroacetabular joint space is maintained. Mild pubic symphysis osteoarthritis. Mild superolateral left acetabular degenerative osteophytosis. No acute fracture or dislocation. Left knee: Mild medial and lateral compartment of the knee chondrocalcinosis. Moderate superior patellar degenerative osteophytosis. No joint effusion. 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 Moderate swelling of the prepatellar soft tissues with a probable hematoma. Left tibia and fibula: Markedly comminuted and displaced fractures of the distal tibial and fibular diaphyses. This includes approximately 2.6 cm lateral and 1.1 cm anterior displacement of the distal tibial fracture component with respect to the proximal tibial fracture component. Mild posterior apex angulation of this fracture on lateral view. Approximately 1.3 cm lateral and 0.8 cm posterior displacement of the distal fibular fracture component with respect to the proximal fracture component. There is likely 1.9 cm craniocaudal bone overlap of the fibular fracture components. The ankle mortise appears symmetric and intact. Mild-to-moderate distal medial malleolar degenerative osteophytosis. There is distal calf subcutaneous air consistent with the reported open nature of the distal calf fractures. Multiple punctate high-grade densities are seen overlying the lateral and posterior aspects of the ankle, possible debris/foreign bodies. IMPRESSION: 1. Markedly comminuted displaced fractures of the distal tibial and fibular diaphyses as  above. 2. Note is made that the distal tip of the fracture is likely an open  fracture. An open fracture was also reported clinically. Electronically Signed   By: Neita Garnet M.D.   On: 12/05/2021 15:09   DG Femur Portable Min 2 Views Left  Result Date: 12/05/2021 CLINICAL DATA:  Larey Seat 8 feet from ladder. Open fracture of the left leg. EXAM: LEFT KNEE - 1-2 VIEW; LEFT TIBIA AND FIBULA - 2 VIEW; LEFT FEMUR PORTABLE 2 VIEWS COMPARISON:  None Available. FINDINGS: Left femur: The left femoroacetabular joint space is maintained. Mild pubic symphysis osteoarthritis. Mild superolateral left acetabular degenerative osteophytosis. No acute fracture or dislocation. Left knee: Mild medial and lateral compartment of the knee chondrocalcinosis. Moderate superior patellar degenerative osteophytosis. No joint effusion. 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 Moderate swelling of the prepatellar soft tissues with a probable hematoma. Left tibia and fibula: Markedly comminuted and displaced fractures of the distal tibial and fibular diaphyses. This includes approximately 2.6 cm lateral and 1.1 cm anterior displacement of the distal tibial fracture component with respect to the proximal tibial fracture component. Mild posterior apex angulation of this fracture on lateral view. Approximately 1.3 cm lateral and 0.8 cm posterior displacement of the distal fibular fracture component with respect to the proximal fracture component. There is likely 1.9 cm craniocaudal bone overlap of the fibular fracture components. The ankle mortise appears symmetric and intact. Mild-to-moderate distal medial malleolar degenerative osteophytosis. There is distal calf subcutaneous air consistent with the reported open nature of the distal calf fractures. Multiple punctate high-grade densities are seen overlying the lateral and posterior aspects of the ankle, possible debris/foreign bodies. IMPRESSION: 1. Markedly comminuted displaced fractures of the  distal tibial and fibular diaphyses as above. 2. Note is made that the distal tip of the fracture is likely an open fracture. An open fracture was also reported clinically. Electronically Signed   By: Neita Garnet M.D.   On: 12/05/2021 15:09   DG Knee 2 Views Left  Result Date: 12/05/2021 CLINICAL DATA:  Larey Seat 8 feet from ladder. Open fracture of the left leg. EXAM: LEFT KNEE - 1-2 VIEW; LEFT TIBIA AND FIBULA - 2 VIEW; LEFT FEMUR PORTABLE 2 VIEWS COMPARISON:  None Available. FINDINGS: Left femur: The left femoroacetabular joint space is maintained. Mild pubic symphysis osteoarthritis. Mild superolateral left acetabular degenerative osteophytosis. No acute fracture or dislocation. Left knee: Mild medial and lateral compartment of the knee chondrocalcinosis. Moderate superior patellar degenerative osteophytosis. No joint effusion. 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 Moderate swelling of the prepatellar soft tissues with a probable hematoma. Left tibia and fibula: Markedly comminuted and displaced fractures of the distal tibial and fibular diaphyses. This includes approximately 2.6 cm lateral and 1.1 cm anterior displacement of the distal tibial fracture component with respect to the proximal tibial fracture component. Mild posterior apex angulation of this fracture on lateral view. Approximately 1.3 cm lateral and 0.8 cm posterior displacement of the distal fibular fracture component with respect to the proximal fracture component. There is likely 1.9 cm craniocaudal bone overlap of the fibular fracture components. The ankle mortise appears symmetric and intact. Mild-to-moderate distal medial malleolar degenerative osteophytosis. There is distal calf subcutaneous air consistent with the reported open nature of the distal calf fractures. Multiple punctate high-grade densities are seen overlying the lateral and posterior aspects of the  ankle, possible debris/foreign bodies. IMPRESSION:  1. Markedly comminuted displaced fractures of the distal tibial and fibular diaphyses as above. 2. Note is made that the distal tip of the fracture is likely an open fracture. An open fracture was also reported clinically. Electronically Signed   By: Neita Garnet M.D.   On: 12/05/2021 15:09     Positive ROS: All other systems have been reviewed and were otherwise negative with the exception of those mentioned in the HPI and as above.  Physical Exam: General: No acute distress Cardiovascular: No pedal edema Respiratory: No cyanosis, no use of accessory musculature GI: No organomegaly, abdomen is soft and non-tender Skin: No lesions in the area of chief complaint Neurologic: Sensation intact distally Psychiatric: Patient is at baseline mood and affect Lymphatic: No axillary or cervical lymphadenopathy  MUSCULOSKELETAL:  There is a wound of compound about the anterior medial distal tibia measuring approximately 6 cm.  There is exposed bone.  This is not grossly contaminated.  There is some swelling about the ankle.  Denies any loss of sensation in any distributions of the foot plantar dorsally.  All of his toes are warm and well-perfused with palpable DP pulse  Independent Imaging Review: 2 views left tib-fib, left CT ankle: There is a metadiaphyseal distal tibia fracture with intra-articular extension and significant displacement  Assessment: 61 year old male with a left pilon fracture open after a fall 12 feet down the ladder.  Given the open nature of the fracture I have recommended an urgent surgical washout with external fixator.  I have also discussed his case with the trauma orthopedic service here at [facility] specifically Dr. Jena Gauss who will plan to perform a swelling check in the morning in terms of preparation for his definitive fixation.  Plan: Plan for left ankle irrigation and debridement with external fixator of the left  ankle   After a lengthy discussion of treatment options, including risks, benefits, alternatives, complications of surgical and nonsurgical conservative options, the patient elected surgical repair.   The patient  is aware of the material risks  and complications including, but not limited to injury to adjacent structures, neurovascular injury, infection, numbness, bleeding, implant failure, thermal burns, stiffness, persistent pain, failure to heal, disease transmission from allograft, need for further surgery, dislocation, anesthetic risks, blood clots, risks of death,and others. The probabilities of surgical success and failure discussed with patient given their particular co-morbidities.The time and nature of expected rehabilitation and recovery was discussed.The patient's questions were all answered preoperatively.  No barriers to understanding were noted. I explained the natural history of the disease process and Rx rationale.  I explained to the patient what I considered to be reasonable expectations given their personal situation.  The final treatment plan was arrived at through a shared patient decision making process model.   Thank you for the consult and the opportunity to see Mr. Kenneth Laufer, MD [facility] 5:53 PM

## 2021-12-05 NOTE — ED Notes (Signed)
Provider Dr Juanita Craver aware of the patients continuous severe pain. CT called at this time to see when the patient can come back over. CT tech reports that they will send for him asap

## 2021-12-05 NOTE — Transfer of Care (Signed)
Immediate Anesthesia Transfer of Care Note  Patient: Kenneth Calhoun  Procedure(s) Performed: LEFT EXTERNAL FIXATION OF PILON FRACTURE POSSIBLE ORIF (Left)  Patient Location: PACU  Anesthesia Type:General  Level of Consciousness: drowsy  Airway & Oxygen Therapy: Patient Spontanous Breathing and Patient connected to nasal cannula oxygen  Post-op Assessment: Report given to RN and Post -op Vital signs reviewed and stable  Post vital signs: Reviewed and stable  Last Vitals:  Vitals Value Taken Time  BP 124/67 12/05/21 2121  Temp    Pulse 85 12/05/21 2125  Resp 13 12/05/21 2125  SpO2 93 % 12/05/21 2125  Vitals shown include unvalidated device data.  Last Pain:  Vitals:   12/05/21 2121  TempSrc:   PainSc: Asleep         Complications: No notable events documented.

## 2021-12-05 NOTE — Anesthesia Preprocedure Evaluation (Signed)
Anesthesia Evaluation  Patient identified by MRN, date of birth, ID band Patient awake    Reviewed: Allergy & Precautions, NPO status , Patient's Chart, lab work & pertinent test results  Airway Mallampati: II  TM Distance: >3 FB Neck ROM: Full    Dental no notable dental hx.    Pulmonary neg pulmonary ROS,    Pulmonary exam normal        Cardiovascular negative cardio ROS   Rhythm:Regular Rate:Normal     Neuro/Psych negative neurological ROS  negative psych ROS   GI/Hepatic Neg liver ROS, GERD  ,  Endo/Other  negative endocrine ROS  Renal/GU negative Renal ROS  negative genitourinary   Musculoskeletal S/p fall off 12 foot ladder with open left pilon fx    Abdominal   Peds  Hematology negative hematology ROS (+)   Anesthesia Other Findings   Reproductive/Obstetrics                             Anesthesia Physical Anesthesia Plan  ASA: 2 and emergent  Anesthesia Plan: General   Post-op Pain Management:    Induction: Intravenous  PONV Risk Score and Plan: 2 and Ondansetron, Dexamethasone, Midazolam and Treatment may vary due to age or medical condition  Airway Management Planned: Mask and Oral ETT  Additional Equipment: None  Intra-op Plan:   Post-operative Plan: Extubation in OR  Informed Consent: I have reviewed the patients History and Physical, chart, labs and discussed the procedure including the risks, benefits and alternatives for the proposed anesthesia with the patient or authorized representative who has indicated his/her understanding and acceptance.     Dental advisory given  Plan Discussed with: CRNA  Anesthesia Plan Comments:         Anesthesia Quick Evaluation

## 2021-12-05 NOTE — Progress Notes (Signed)
Dr. Bynum Bellows with urology at  bedside.

## 2021-12-05 NOTE — ED Notes (Signed)
Provider at bedside at this time

## 2021-12-05 NOTE — ED Provider Notes (Signed)
[facility] EMERGENCY DEPARTMENT Provider Note   CSN: [id number] Arrival date & time: 12/05/21  1418     History  Chief Complaint  Patient presents with   Kenneth Calhoun is a 61 y.o. male.  Patient is a 61 year old male presenting from fall from ladder with open left lower extremity fracture.  States he was approximately 8 rungs of the ladder high when he fell backwards injuring his left lower extremity.  Admits to immediate pain. Denies sensation or motor dysfunctions.  Unknown blunt head trauma.  Eyes any other bony pain at this time.  Patient got fentanyl and Ancef in route.  The history is provided by the patient. No language interpreter was used.  Fall Pertinent negatives include no chest pain, no abdominal pain and no shortness of breath.       Home Medications Prior to Admission medications   Medication Sig Start Date End Date Taking? Authorizing Provider  HYDROcodone-acetaminophen (NORCO) 5-325 MG per tablet Take 1 tablet by mouth every 6 (six) hours as needed. Patient not taking: Reported on 06/12/2018 05/24/14   Toy Cookey, MD  ibuprofen (ADVIL,MOTRIN) 200 MG tablet Take 400 mg by mouth every morning.    [provider]  Ibuprofen-Diphenhydramine HCl (ADVIL PM) 200-25 MG CAPS Take 2 tablets by mouth at bedtime as needed (sleep).    [provider]      Allergies    Patient has no known allergies.    Review of Systems   Review of Systems  Constitutional:  Negative for chills and fever.  HENT:  Negative for ear pain and sore throat.   Eyes:  Negative for pain and visual disturbance.  Respiratory:  Negative for cough and shortness of breath.   Cardiovascular:  Negative for chest pain and palpitations.  Gastrointestinal:  Negative for abdominal pain and vomiting.  Genitourinary:  Negative for dysuria and hematuria.  Musculoskeletal:  Positive for gait problem. Negative for arthralgias and back pain.  Skin:   Positive for wound. Negative for color change and rash.  Neurological:  Negative for seizures and syncope.  All other systems reviewed and are negative.   Physical Exam Updated Vital Signs BP (!) 168/89   Pulse 77   Temp (!) 97.5 F (36.4 C) (Oral)   Resp 18   SpO2 98%  Physical Exam Vitals and nursing note reviewed.  Constitutional:      General: He is not in acute distress.    Appearance: He is well-developed.  HENT:     Head: Normocephalic and atraumatic.  Eyes:     Conjunctiva/sclera: Conjunctivae normal.  Cardiovascular:     Rate and Rhythm: Normal rate and regular rhythm.     Heart sounds: No murmur heard. Pulmonary:     Effort: Pulmonary effort is normal. No respiratory distress.     Breath sounds: Normal breath sounds.  Abdominal:     Palpations: Abdomen is soft.     Tenderness: There is no abdominal tenderness.  Musculoskeletal:        General: No swelling.     Cervical back: Neck supple.     Left lower leg: Deformity and bony tenderness present.  Skin:    General: Skin is warm and dry.     Capillary Refill: Capillary refill takes less than 2 seconds.  Neurological:     Mental Status: He is alert.  Psychiatric:        Mood and Affect: Mood normal.  ED Results / Procedures / Treatments   Labs (all labs ordered are listed, but only abnormal results are displayed) Labs Reviewed  CBC WITH DIFFERENTIAL/PLATELET - Abnormal; Notable for the following components:      Result Value   WBC 11.1 (*)    Abs Immature Granulocytes 0.10 (*)    All other components within normal limits  BASIC METABOLIC PANEL - Abnormal; Notable for the following components:   Glucose, Bld 107 (*)    Creatinine, Ser 1.66 (*)    Calcium 8.5 (*)    GFR, Estimated 47 (*)    All other components within normal limits    EKG None  Radiology CT Head Wo Contrast  Result Date: 12/05/2021 CLINICAL DATA:  Fall from ladder. EXAM: CT HEAD WITHOUT CONTRAST CT CERVICAL SPINE WITHOUT  CONTRAST TECHNIQUE: Multidetector CT imaging of the head and cervical spine was performed following the standard protocol without intravenous contrast. Multiplanar CT image reconstructions of the cervical spine were also generated. RADIATION DOSE REDUCTION: This exam was performed according to the departmental dose-optimization program which includes automated exposure control, adjustment of the mA and/or kV according to patient size and/or use of iterative reconstruction technique. COMPARISON:  None Available. FINDINGS: CT HEAD FINDINGS Brain: There is no acute intracranial hemorrhage, extra-axial fluid collection, or acute infarct. Parenchymal volume is normal. The ventricles are normal in size. Kyliegh Jester-white differentiation is preserved There is no mass lesion.  There is no mass effect or midline shift. Vascular: No hyperdense vessel or unexpected calcification. Skull: Normal. Negative for fracture or focal lesion. Sinuses/Orbits: The imaged paranasal sinuses are clear. The globes and orbits are unremarkable. Other: None. CT CERVICAL SPINE FINDINGS Alignment: Normal. There is no jumped or perched facet or other evidence of traumatic malalignment. Skull base and vertebrae: Skull base alignment is maintained. Vertebral body heights are preserved. There is no evidence of acute fracture. Soft tissues and spinal canal: No prevertebral fluid or swelling. No visible canal hematoma. Disc levels: There is overall mild degenerative change throughout the cervical spine. There is no evidence of high-grade spinal canal or neural foraminal stenosis. Upper chest: The imaged lung apices are clear. Other: None. IMPRESSION: 1. No acute intracranial hemorrhage or calvarial fracture. 2. No acute fracture or traumatic malalignment of the cervical spine. Electronically Signed   By: Lesia HausenPeter  Noone M.D.   On: 12/05/2021 16:09   CT Cervical Spine Wo Contrast  Result Date: 12/05/2021 CLINICAL DATA:  Fall from ladder. EXAM: CT HEAD WITHOUT  CONTRAST CT CERVICAL SPINE WITHOUT CONTRAST TECHNIQUE: Multidetector CT imaging of the head and cervical spine was performed following the standard protocol without intravenous contrast. Multiplanar CT image reconstructions of the cervical spine were also generated. RADIATION DOSE REDUCTION: This exam was performed according to the departmental dose-optimization program which includes automated exposure control, adjustment of the mA and/or kV according to patient size and/or use of iterative reconstruction technique. COMPARISON:  None Available. FINDINGS: CT HEAD FINDINGS Brain: There is no acute intracranial hemorrhage, extra-axial fluid collection, or acute infarct. Parenchymal volume is normal. The ventricles are normal in size. Esco Joslyn-white differentiation is preserved There is no mass lesion.  There is no mass effect or midline shift. Vascular: No hyperdense vessel or unexpected calcification. Skull: Normal. Negative for fracture or focal lesion. Sinuses/Orbits: The imaged paranasal sinuses are clear. The globes and orbits are unremarkable. Other: None. CT CERVICAL SPINE FINDINGS Alignment: Normal. There is no jumped or perched facet or other evidence of traumatic malalignment. Skull base and vertebrae: Skull  base alignment is maintained. Vertebral body heights are preserved. There is no evidence of acute fracture. Soft tissues and spinal canal: No prevertebral fluid or swelling. No visible canal hematoma. Disc levels: There is overall mild degenerative change throughout the cervical spine. There is no evidence of high-grade spinal canal or neural foraminal stenosis. Upper chest: The imaged lung apices are clear. Other: None. IMPRESSION: 1. No acute intracranial hemorrhage or calvarial fracture. 2. No acute fracture or traumatic malalignment of the cervical spine. Electronically Signed   By: Lesia Hausen M.D.   On: 12/05/2021 16:09   DG Tibia/Fibula Left  Result Date: 12/05/2021 CLINICAL DATA:  Larey Seat 8 feet  from ladder. Open fracture of the left leg. EXAM: LEFT KNEE - 1-2 VIEW; LEFT TIBIA AND FIBULA - 2 VIEW; LEFT FEMUR PORTABLE 2 VIEWS COMPARISON:  None Available. FINDINGS: Left femur: The left femoroacetabular joint space is maintained. Mild pubic symphysis osteoarthritis. Mild superolateral left acetabular degenerative osteophytosis. No acute fracture or dislocation. Left knee: Mild medial and lateral compartment of the knee chondrocalcinosis. Moderate superior patellar degenerative osteophytosis. No joint effusion. 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 Moderate swelling of the prepatellar soft tissues with a probable hematoma. Left tibia and fibula: Markedly comminuted and displaced fractures of the distal tibial and fibular diaphyses. This includes approximately 2.6 cm lateral and 1.1 cm anterior displacement of the distal tibial fracture component with respect to the proximal tibial fracture component. Mild posterior apex angulation of this fracture on lateral view. Approximately 1.3 cm lateral and 0.8 cm posterior displacement of the distal fibular fracture component with respect to the proximal fracture component. There is likely 1.9 cm craniocaudal bone overlap of the fibular fracture components. The ankle mortise appears symmetric and intact. Mild-to-moderate distal medial malleolar degenerative osteophytosis. There is distal calf subcutaneous air consistent with the reported open nature of the distal calf fractures. Multiple punctate high-grade densities are seen overlying the lateral and posterior aspects of the ankle, possible debris/foreign bodies. IMPRESSION: 1. Markedly comminuted displaced fractures of the distal tibial and fibular diaphyses as above. 2. Note is made that the distal tip of the fracture is likely an open fracture. An open fracture was also reported clinically. Electronically Signed   By: Neita Garnet M.D.    On: 12/05/2021 15:09   DG Femur Portable Min 2 Views Left  Result Date: 12/05/2021 CLINICAL DATA:  Larey Seat 8 feet from ladder. Open fracture of the left leg. EXAM: LEFT KNEE - 1-2 VIEW; LEFT TIBIA AND FIBULA - 2 VIEW; LEFT FEMUR PORTABLE 2 VIEWS COMPARISON:  None Available. FINDINGS: Left femur: The left femoroacetabular joint space is maintained. Mild pubic symphysis osteoarthritis. Mild superolateral left acetabular degenerative osteophytosis. No acute fracture or dislocation. Left knee: Mild medial and lateral compartment of the knee chondrocalcinosis. Moderate superior patellar degenerative osteophytosis. No joint effusion. 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 Moderate swelling of the prepatellar soft tissues with a probable hematoma. Left tibia and fibula: Markedly comminuted and displaced fractures of the distal tibial and fibular diaphyses. This includes approximately 2.6 cm lateral and 1.1 cm anterior displacement of the distal tibial fracture component with respect to the proximal tibial fracture component. Mild posterior apex angulation of this fracture on lateral view. Approximately 1.3 cm lateral and 0.8 cm posterior displacement of the distal fibular fracture component with respect to the proximal fracture component. There is likely 1.9 cm  craniocaudal bone overlap of the fibular fracture components. The ankle mortise appears symmetric and intact. Mild-to-moderate distal medial malleolar degenerative osteophytosis. There is distal calf subcutaneous air consistent with the reported open nature of the distal calf fractures. Multiple punctate high-grade densities are seen overlying the lateral and posterior aspects of the ankle, possible debris/foreign bodies. IMPRESSION: 1. Markedly comminuted displaced fractures of the distal tibial and fibular diaphyses as above. 2. Note is made that the distal tip of the fracture is  likely an open fracture. An open fracture was also reported clinically. Electronically Signed   By: Neita Garnet M.D.   On: 12/05/2021 15:09   DG Knee 2 Views Left  Result Date: 12/05/2021 CLINICAL DATA:  Larey Seat 8 feet from ladder. Open fracture of the left leg. EXAM: LEFT KNEE - 1-2 VIEW; LEFT TIBIA AND FIBULA - 2 VIEW; LEFT FEMUR PORTABLE 2 VIEWS COMPARISON:  None Available. FINDINGS: Left femur: The left femoroacetabular joint space is maintained. Mild pubic symphysis osteoarthritis. Mild superolateral left acetabular degenerative osteophytosis. No acute fracture or dislocation. Left knee: Mild medial and lateral compartment of the knee chondrocalcinosis. Moderate superior patellar degenerative osteophytosis. No joint effusion. 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 Moderate swelling of the prepatellar soft tissues with a probable hematoma. Left tibia and fibula: Markedly comminuted and displaced fractures of the distal tibial and fibular diaphyses. This includes approximately 2.6 cm lateral and 1.1 cm anterior displacement of the distal tibial fracture component with respect to the proximal tibial fracture component. Mild posterior apex angulation of this fracture on lateral view. Approximately 1.3 cm lateral and 0.8 cm posterior displacement of the distal fibular fracture component with respect to the proximal fracture component. There is likely 1.9 cm craniocaudal bone overlap of the fibular fracture components. The ankle mortise appears symmetric and intact. Mild-to-moderate distal medial malleolar degenerative osteophytosis. There is distal calf subcutaneous air consistent with the reported open nature of the distal calf fractures. Multiple punctate high-grade densities are seen overlying the lateral and posterior aspects of the ankle, possible debris/foreign bodies. IMPRESSION: 1. Markedly comminuted displaced fractures of the  distal tibial and fibular diaphyses as above. 2. Note is made that the distal tip of the fracture is likely an open fracture. An open fracture was also reported clinically. Electronically Signed   By: Neita Garnet M.D.   On: 12/05/2021 15:09    Procedures .Critical Care  Performed by: Franne Forts, DO Authorized by: Franne Forts, DO   Critical care provider statement:    Critical care time (minutes):  75   Critical care was necessary to treat or prevent imminent or life-threatening deterioration of the following conditions: Fall from height, open tib-fib fracture.   Critical care was time spent personally by me on the following activities:  Development of treatment plan with patient or surrogate, discussions with consultants, evaluation of patient's response to treatment, examination of patient, ordering and review of laboratory studies, ordering and review of radiographic studies, ordering and performing treatments and interventions, pulse oximetry, re-evaluation of patient's condition and review of old charts   Care discussed with comment:  Orthopedic surgery     Medications Ordered in ED Medications  morphine (PF) 4 MG/ML injection 4 mg (4 mg Intravenous Given 12/05/21 1523)  ondansetron (ZOFRAN) injection 4 mg (4 mg Intravenous Given 12/05/21 1648)  Tdap (BOOSTRIX) injection 0.5 mL (0.5 mLs Intramuscular Given 12/05/21 1521)  0.9 %  sodium chloride infusion (  Intravenous New Bag/Given 12/05/21 1648)  fentaNYL (SUBLIMAZE) injection 100 mcg (100 mcg Intravenous Given 12/05/21 1648)  morphine (PF) 4 MG/ML injection 4 mg (4 mg Intravenous Given 12/05/21 1649)    ED Course/ Medical Decision Making/ A&P                           Medical Decision Making Amount and/or Complexity of Data Reviewed Labs: ordered. Radiology: ordered.  Risk Prescription drug management. Decision regarding hospitalization.   72:73 PM 61 year old male presenting from fall from ladder with open left lower  extremity fracture.  Patient is alert and oriented x3, no acute distress, afebrile, stable vital signs.  Physical exam demonstrates open fracture to the left lower extremity.  Sensation motor function intact.  Dorsalis pedis present palpable, and marked with a pen by myself.  X-ray demonstrates severe comminuted open tib-fib fracture.  I spoke with orthopedic surgeon Dr. Steward Drone CT imaging will check OR for availability.  Tdap in ED.  Ancef given by EMS.  Patient got morphine 4 mg x 2 with minimal improvement pain.  Fentanyl 100 mg attempted.  Patient otherwise has no other signs of trauma at this time.  CT head and neck ordered for fall from 8 rungs of the ladder up and distracting injury.  CT head and neck demonstrates no acute process.          Final Clinical Impression(s) / ED Diagnoses Final diagnoses:  Type III open fracture of distal end of left tibia, unspecified fracture morphology, initial encounter  Type III open fracture of distal end of left fibula, unspecified fracture morphology, initial encounter    Rx / DC Orders ED Discharge Orders     None         Franne Forts, DO 12/05/21 1704

## 2021-12-05 NOTE — Anesthesia Procedure Notes (Signed)
Procedure Name: Intubation Date/Time: 12/05/2021 7:56 PM  Performed by: Eligha Bridegroom, CRNAPre-anesthesia Checklist: Patient identified, Emergency Drugs available, Suction available and Patient being monitored Oxygen Delivery Method: Circle system utilized Preoxygenation: Pre-oxygenation with 100% oxygen Induction Type: Rapid sequence and Cricoid Pressure applied Laryngoscope Size: Mac and 4 Grade View: Grade I Tube type: Oral Tube size: 7.5 mm Airway Equipment and Method: Stylet Secured at: 21 cm Tube secured with: Tape Dental Injury: Teeth and Oropharynx as per pre-operative assessment

## 2021-12-06 LAB — BASIC METABOLIC PANEL
Anion gap: 8 (ref 5–15)
BUN: 18 mg/dL (ref 8–23)
CO2: 23 mmol/L (ref 22–32)
Calcium: 8 mg/dL — ABNORMAL LOW (ref 8.9–10.3)
Chloride: 106 mmol/L (ref 98–111)
Creatinine, Ser: 1.51 mg/dL — ABNORMAL HIGH (ref 0.61–1.24)
GFR, Estimated: 52 mL/min — ABNORMAL LOW (ref >60)
Glucose, Bld: 163 mg/dL — ABNORMAL HIGH (ref 70–99)
Potassium: 4.1 mmol/L (ref 3.5–5.1)
Sodium: 137 mmol/L (ref 135–145)

## 2021-12-06 MED ORDER — MELATONIN 3 MG PO TABS
3.0000 mg | ORAL_TABLET | Freq: Once | ORAL | Status: AC
  Administered 2021-12-06: 3 mg via ORAL
  Filled 2021-12-06: qty 1

## 2021-12-06 MED ORDER — SODIUM CHLORIDE 0.9 % IV SOLN
2.0000 g | INTRAVENOUS | Status: AC
  Administered 2021-12-06 – 2021-12-08 (×3): 2 g via INTRAVENOUS
  Filled 2021-12-06 (×3): qty 20

## 2021-12-06 MED ORDER — CHLORHEXIDINE GLUCONATE CLOTH 2 % EX PADS
6.0000 | MEDICATED_PAD | Freq: Every day | CUTANEOUS | Status: DC
  Administered 2021-12-08: 6 via TOPICAL

## 2021-12-06 MED ORDER — MELATONIN 3 MG PO TABS: 3.0000 mg | ORAL_TABLET | Freq: Every day | ORAL | Status: DC

## 2021-12-06 MED ORDER — CALCIUM CARBONATE ANTACID 500 MG PO CHEW
1.0000 | CHEWABLE_TABLET | Freq: Three times a day (TID) | ORAL | Status: DC
  Administered 2021-12-06 – 2021-12-10 (×7): 200 mg via ORAL
  Filled 2021-12-06 (×8): qty 1

## 2021-12-06 MED ORDER — MELATONIN 3 MG PO TABS
3.0000 mg | ORAL_TABLET | Freq: Every day | ORAL | Status: DC
  Administered 2021-12-06 – 2021-12-09 (×4): 3 mg via ORAL
  Filled 2021-12-06 (×4): qty 1

## 2021-12-06 NOTE — Progress Notes (Signed)
Consent for ORIF tomorrow completed and placed in patients chart

## 2021-12-06 NOTE — Evaluation (Signed)
Physical Therapy Evaluation Patient Details Name: Kenneth Calhoun MRN: [id number] DOB: 20-Apr-1961 Today's Date: 12/06/2021  History of Present Illness  Pt is a 61 y/o M s/p L external fixation of pilon fracture possible ORIF after a 65ft fall off a ladder. Pt is now NWB LLE. PMH includes gastritis and appendectomy.  Clinical Impression  Received pt semi-reclined in bed with wife present during session. Pt fearful of movement this morning but with encouragement agreed to attempt standing. Pt performed bed mobility with supervision and stood with RW and min A with good adherence to LLE NWB precautions. Pt declined any further standing or transfers due to increased pain/fear. Pt's wife is able to provide 24/7 supervision upon D/C; recommend HHPT to address current deficits. Acute PT to cont to follow.      Recommendations for follow up therapy are one component of a multi-disciplinary discharge planning process, led by the attending physician.  Recommendations may be updated based on patient status, additional functional criteria and insurance authorization.  Follow Up Recommendations Home health PT      Assistance Recommended at Discharge Intermittent Supervision/Assistance  Patient can return home with the following  A little help with walking and/or transfers;A little help with bathing/dressing/bathroom;Assist for transportation;Help with stairs or ramp for entrance    Equipment Recommendations Rolling walker (2 wheels)  Recommendations for Other Services  OT consult    Functional Status Assessment Patient has had a recent decline in their functional status and demonstrates the ability to make significant improvements in function in a reasonable and predictable amount of time.     Precautions / Restrictions Precautions Precautions: Fall Restrictions Weight Bearing Restrictions: Yes LLE Weight Bearing: Non weight bearing      Mobility  Bed Mobility Overal bed mobility: Needs  Assistance Bed Mobility: Supine to Sit, Rolling, Sit to Supine Rolling: Supervision   Supine to sit: Supervision Sit to supine: Supervision   General bed mobility comments: HOB elevated and use of bedrails Patient Response: Anxious, Cooperative  Transfers Overall transfer level: Needs assistance Equipment used: Rolling walker (2 wheels) Transfers: Sit to/from Stand Sit to Stand: Min assist           General transfer comment: stood from elevated EOB with RW and min A - cues for technique and hand placement. Pt very fearful    Ambulation/Gait               General Gait Details: pt declined ambulating due to fear, but therapist demonstrated technique while maintaining precautions.  Stairs            Wheelchair Mobility    Modified Rankin (Stroke Patients Only)       Balance Overall balance assessment: Needs assistance Sitting-balance support: Bilateral upper extremity supported, Feet supported Sitting balance-Leahy Scale: Fair Sitting balance - Comments: maintained static sitting balance with distance suervision while holding LLE off floor   Standing balance support: Bilateral upper extremity supported, Reliant on assistive device for balance (RW) Standing balance-Leahy Scale: Fair Standing balance comment: pt able to maintain static standing balance with very close supervision.                             Pertinent Vitals/Pain Pain Assessment Pain Assessment: 0-10 Pain Score: 7  Pain Location: R ankle Pain Descriptors / Indicators: Discomfort, Grimacing, Guarding, Heaviness, Sore, Squeezing, Throbbing Pain Intervention(s): Limited activity within patient's tolerance, Monitored during session, Repositioned, Patient requesting pain meds-RN notified, Ice applied  Home Living Family/patient expects to be discharged to:: Private residence Living Arrangements: Spouse/significant other;Children (wife and son) Available Help at Discharge:  Family;Available 24 hours/day Type of Home: House Home Access: Stairs to enter Entrance Stairs-Rails: Right;Left Entrance Stairs-Number of Steps: 3 STE from laundry room, 2 STE from garage, and 6 STE from front porch with 2 rails but too far apart to reach. Plans on getting ramp built ASAP   Home Layout: One level Home Equipment: Rollator (4 wheels) Additional Comments: rollator from wife's father but will need RW    Prior Function Prior Level of Function : Independent/Modified Independent;Working/employed;Driving                     Hand Dominance   Dominant Hand: Right    Extremity/Trunk Assessment   Upper Extremity Assessment Upper Extremity Assessment: Defer to OT evaluation    Lower Extremity Assessment Lower Extremity Assessment: Generalized weakness    Cervical / Trunk Assessment Cervical / Trunk Assessment: Normal  Communication   Communication: No difficulties  Cognition Arousal/Alertness: Awake/alert Behavior During Therapy: WFL for tasks assessed/performed Overall Cognitive Status: Within Functional Limits for tasks assessed                                          General Comments General comments (skin integrity, edema, etc.): Pt initally on 2L O2 via [place] with sats 94%. Weaned to RA and sats dropped as low as 91% but recovered to 94%    Exercises     Assessment/Plan    PT Assessment Patient needs continued PT services  PT Problem List Decreased strength;Decreased range of motion;Decreased activity tolerance;Decreased balance;Decreased mobility;Decreased coordination;Cardiopulmonary status limiting activity;Decreased knowledge of use of DME;Pain       PT Treatment Interventions DME instruction;Gait training;Stair training;Functional mobility training;Therapeutic activities;Therapeutic exercise;Patient/family education;Neuromuscular re-education;Balance training    PT Goals (Current goals can be found in the Care Plan section)   Acute Rehab PT Goals Patient Stated Goal: to return home PT Goal Formulation: With patient/family Time For Goal Achievement: 12/13/21 Potential to Achieve Goals: Good    Frequency Min 5X/week     Co-evaluation               AM-PAC PT "6 Clicks" Mobility  Outcome Measure Help needed turning from your back to your side while in a flat bed without using bedrails?: A Little Help needed moving from lying on your back to sitting on the side of a flat bed without using bedrails?: A Little Help needed moving to and from a bed to a chair (including a wheelchair)?: A Little Help needed standing up from a chair using your arms (e.g., wheelchair or bedside chair)?: A Little Help needed to walk in hospital room?: A Little Help needed climbing 3-5 steps with a railing? : A Lot 6 Click Score: 17    End of Session Equipment Utilized During Treatment: Gait belt Activity Tolerance: Patient tolerated treatment well Patient left: in bed;with call bell/phone within reach;with bed alarm set;with family/visitor present Nurse Communication: Mobility status PT Visit Diagnosis: Unsteadiness on feet (R26.81);Other abnormalities of gait and mobility (R26.89);Muscle weakness (generalized) (M62.81);Pain Pain - Right/Left: Left Pain - part of body: Leg;Ankle and joints of foot    Time: 6269-4854 PT Time Calculation (min) (ACUTE ONLY): 25 min   Charges:   PT Evaluation $PT Eval Moderate Complexity: 1 Mod PT Treatments $Therapeutic Activity:  8-22 mins        Raechel Chute PT, DPT  Alfonso Patten 12/06/2021, 9:47 AM

## 2021-12-06 NOTE — TOC CAGE-AID Note (Addendum)
Transition of Care [facility]) - CAGE-AID Screening   Patient Details  Name: Kenneth Calhoun MRN: [id number] Date of Birth: [birth year]  Transition of [facility]) CM/SW Contact:    Coralee Pesa, [place] Phone Number: 12/06/2021, 12:17 PM   Clinical Narrative: CSW met with pt to complete CAGE- AID assessment, pt notes no drug or alcohol use, no resources needed.   CAGE-AID Screening:    Have You Ever Felt You Ought to Cut Down on Your Drinking or Drug Use?: No Have People Annoyed You By Critizing Your Drinking Or Drug Use?: No Have You Felt Bad Or Guilty About Your Drinking Or Drug Use?: No Have You Ever Had a Drink or Used Drugs First Thing In The Morning to Steady Your Nerves or to Get Rid of a Hangover?: No CAGE-AID Score: 0  Substance Abuse Education Offered: No

## 2021-12-06 NOTE — Plan of Care (Signed)
Came from PACU last night, surgical site with external fixator intact, with some old blood drainage. Ice pack maintained.  Problem: Pain Managment: Goal: General experience of comfort will improve Outcome: Progressing   Problem: Safety: Goal: Ability to remain free from injury will improve Outcome: Progressing

## 2021-12-06 NOTE — Consult Note (Signed)
Orthopaedic Trauma Service (OTS) Consult   Patient ID: Kenneth Calhoun MRN: [id number] DOB/AGE: August 06, 1960 61 y.o.  Reason for Consult:Left open pilon fracture Referring Physician: Dr. Merton Border, MD Cyndia Skeeters  HPI: Kenneth Calhoun is an 61 y.o. male who is being seen in consultation at the request of Dr. Steward Drone for evaluation of left open pilon fracture.  The patient was at work where he fell from a ladder sustaining a open distal tibia and fibula fracture.  He was taken urgently for irrigation debridement with external fixation yesterday evening.  Due to the complexity of the fracture he felt that this was outside the scope of practice and required treatment by an orthopedic traumatologist.  Patient was seen and evaluated at bedside this morning.  His wife is at bedside.  He is currently comfortable.  He did have some issues with the urinary retention and the Foley catheter was placed by urology.  Denies any numbness or tingling.  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  He ambulates without assist device.  He has no medical problems.  Does not take any medications.  Denies any tobacco use.  Past Medical History:  Diagnosis Date   Gastritis     Past Surgical History:  Procedure Laterality Date   APPENDECTOMY      History reviewed. No pertinent family history.  Social History:  reports that he has never smoked. He has never used smokeless tobacco. He reports that he does not drink alcohol and does not use drugs.  Allergies: No Known Allergies  Medications:  No current facility-administered medications on file prior to encounter.   Current Outpatient Medications on File Prior to Encounter  Medication Sig Dispense Refill   diphenhydramine-acetaminophen (TYLENOL PM) 25-500 MG TABS tablet Take 1 tablet by mouth at bedtime as needed (sleep).       ROS: Constitutional: No fever or chills Vision: No  changes in vision ENT: No difficulty swallowing CV: No chest pain Pulm: No SOB or wheezing GI: No nausea or vomiting GU: No urgency or inability to hold urine Skin: No poor wound healing Neurologic: No numbness or tingling Psychiatric: No depression or anxiety Heme: No bruising Allergic: No reaction to medications or food   Exam: Blood pressure 126/75, pulse 75, temperature 97.8 F (36.6 C), temperature source Oral, resp. rate 17, height 5' 10.5" (1.791 m), weight 98.4 kg, SpO2 98 %. General: No acute distress Orientation: Awake alert and oriented x3 Mood and Affect: Cooperative and pleasant Gait: Unable to assess due to his fractures Coordination and balance: Within normal limits  Left lower extremity: Ex-Fix is in place and is clean dry and intact.  Dressings are in place.  He has moderate swelling throughout his lower extremity with some ecchymosis.  He does have intact dorsiflexion plantarflexion of his toes.  He has warm well-perfused foot with brisk cap refill less than 2 seconds.  I appreciate a DP pulse on exam today and this morning.  No lymphadenopathy.  Reflexes within normal limits.  Right lower extremity and bilateral upper extremities: Skin without lesions. No tenderness to palpation. Full painless ROM, full strength in each muscle groups without evidence of instability.   Medical Decision Making: Data: Imaging: X-rays and CT scan are reviewed which shows a comminuted intra-articular distal tibia and fibula fracture.  There is a coronal split with associated soft tissue gas consistent with open fracture.  Labs:  Results for orders placed or performed during the hospital encounter of 12/05/21 (from the past 24 hour(s))  CBC with Differential     Status: Abnormal   Collection Time: 12/05/21  2:39 PM  Result Value Ref Range   WBC 11.1 (H) 4.0 - 10.5 K/uL   RBC 5.36 4.22 - 5.81 MIL/uL   Hemoglobin 16.0 13.0 - 17.0 g/dL   HCT 45.2 39.0 - 52.0 %   MCV 84.3 80.0 - 100.0  fL   MCH 29.9 26.0 - 34.0 pg   MCHC 35.4 30.0 - 36.0 g/dL   RDW 12.6 11.5 - 15.5 %   Platelets 269 150 - 400 K/uL   nRBC 0.0 0.0 - 0.2 %   Neutrophils Relative % 67 %   Neutro Abs 7.7 1.7 - 7.7 K/uL   Lymphocytes Relative 22 %   Lymphs Abs 2.4 0.7 - 4.0 K/uL   Monocytes Relative 7 %   Monocytes Absolute 0.8 0.1 - 1.0 K/uL   Eosinophils Relative 2 %   Eosinophils Absolute 0.2 0.0 - 0.5 K/uL   Basophils Relative 1 %   Basophils Absolute 0.1 0.0 - 0.1 K/uL   Immature Granulocytes 1 %   Abs Immature Granulocytes 0.10 (H) 0.00 - 0.07 K/uL  Basic metabolic panel     Status: Abnormal   Collection Time: 12/05/21  2:39 PM  Result Value Ref Range   Sodium 139 135 - 145 mmol/L   Potassium 3.8 3.5 - 5.1 mmol/L   Chloride 107 98 - 111 mmol/L   CO2 23 22 - 32 mmol/L   Glucose, Bld 107 (H) 70 - 99 mg/dL   BUN 15 8 - 23 mg/dL   Creatinine, Ser 1.66 (H) 0.61 - 1.24 mg/dL   Calcium 8.5 (L) 8.9 - 10.3 mg/dL   GFR, Estimated 47 (L) >60 mL/min   Anion gap 9 5 - 15  Basic metabolic panel     Status: Abnormal   Collection Time: 12/06/21  3:06 AM  Result Value Ref Range   Sodium 137 135 - 145 mmol/L   Potassium 4.1 3.5 - 5.1 mmol/L   Chloride 106 98 - 111 mmol/L   CO2 23 22 - 32 mmol/L   Glucose, Bld 163 (H) 70 - 99 mg/dL   BUN 18 8 - 23 mg/dL   Creatinine, Ser 1.51 (H) 0.61 - 1.24 mg/dL   Calcium 8.0 (L) 8.9 - 10.3 mg/dL   GFR, Estimated 52 (L) >60 mL/min   Anion gap 8 5 - 15     Imaging or Labs ordered: None  Medical history and chart was reviewed and case discussed with medical provider.  Assessment/Plan: 61 year old male with left type IIIa open distal tibia and fibula fracture.  Patient requires formal open reduction internal fixation.  His swelling appears to be decent this morning.  I will reassess tomorrow and tentatively place him on the surgery schedule for open reduction internal fixation.  I discussed risks and benefits with the patient and his wife.  I discussed the highest  risk is infection and wound healing problems and need for soft tissue coverage, I also discussed the possibility of posttraumatic arthritis ankle stiffness, I discussed the routine postoperative course including nonweightbearing precautions as well as potential return to food full duty work if there were no complications.  After full discussion the patient and his wife agreed to proceed will recheck swelling first thing tomorrow morning.  I placed him n.p.o. and tentatively placed him on the surgery  schedule.  Roby Lofts, MD [facility] [phone] (office) [email or web address]

## 2021-12-06 NOTE — Progress Notes (Signed)
Leave foley in Will follow post op

## 2021-12-06 NOTE — Progress Notes (Signed)
Patient is asking for sleeping, called MD. Verbal order of melatonin 3 mg tablet.

## 2021-12-06 NOTE — H&P (View-Only) (Signed)
Orthopaedic Trauma Service (OTS) Consult   Patient ID: Kenneth Calhoun MRN: [id number] DOB/AGE: August 06, 1960 61 y.o.  Reason for Consult:Left open pilon fracture Referring Physician: Dr. Merton Border, MD Cyndia Skeeters  HPI: Kenneth Calhoun is an 61 y.o. male who is being seen in consultation at the request of Dr. Steward Drone for evaluation of left open pilon fracture.  The patient was at work where he fell from a ladder sustaining a open distal tibia and fibula fracture.  He was taken urgently for irrigation debridement with external fixation yesterday evening.  Due to the complexity of the fracture he felt that this was outside the scope of practice and required treatment by an orthopedic traumatologist.  Patient was seen and evaluated at bedside this morning.  His wife is at bedside.  He is currently comfortable.  He did have some issues with the urinary retention and the Foley catheter was placed by urology.  Denies any numbness or tingling.  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  He ambulates without assist device.  He has no medical problems.  Does not take any medications.  Denies any tobacco use.  Past Medical History:  Diagnosis Date   Gastritis     Past Surgical History:  Procedure Laterality Date   APPENDECTOMY      History reviewed. No pertinent family history.  Social History:  reports that he has never smoked. He has never used smokeless tobacco. He reports that he does not drink alcohol and does not use drugs.  Allergies: No Known Allergies  Medications:  No current facility-administered medications on file prior to encounter.   Current Outpatient Medications on File Prior to Encounter  Medication Sig Dispense Refill   diphenhydramine-acetaminophen (TYLENOL PM) 25-500 MG TABS tablet Take 1 tablet by mouth at bedtime as needed (sleep).       ROS: Constitutional: No fever or chills Vision: No  changes in vision ENT: No difficulty swallowing CV: No chest pain Pulm: No SOB or wheezing GI: No nausea or vomiting GU: No urgency or inability to hold urine Skin: No poor wound healing Neurologic: No numbness or tingling Psychiatric: No depression or anxiety Heme: No bruising Allergic: No reaction to medications or food   Exam: Blood pressure 126/75, pulse 75, temperature 97.8 F (36.6 C), temperature source Oral, resp. rate 17, height 5' 10.5" (1.791 m), weight 98.4 kg, SpO2 98 %. General: No acute distress Orientation: Awake alert and oriented x3 Mood and Affect: Cooperative and pleasant Gait: Unable to assess due to his fractures Coordination and balance: Within normal limits  Left lower extremity: Ex-Fix is in place and is clean dry and intact.  Dressings are in place.  He has moderate swelling throughout his lower extremity with some ecchymosis.  He does have intact dorsiflexion plantarflexion of his toes.  He has warm well-perfused foot with brisk cap refill less than 2 seconds.  I appreciate a DP pulse on exam today and this morning.  No lymphadenopathy.  Reflexes within normal limits.  Right lower extremity and bilateral upper extremities: Skin without lesions. No tenderness to palpation. Full painless ROM, full strength in each muscle groups without evidence of instability.   Medical Decision Making: Data: Imaging: X-rays and CT scan are reviewed which shows a comminuted intra-articular distal tibia and fibula fracture.  There is a coronal split with associated soft tissue gas consistent with open fracture.  Labs:  Results for orders placed or performed during the hospital encounter of 12/05/21 (from the past 24 hour(s))  CBC with Differential     Status: Abnormal   Collection Time: 12/05/21  2:39 PM  Result Value Ref Range   WBC 11.1 (H) 4.0 - 10.5 K/uL   RBC 5.36 4.22 - 5.81 MIL/uL   Hemoglobin 16.0 13.0 - 17.0 g/dL   HCT 45.2 39.0 - 52.0 %   MCV 84.3 80.0 - 100.0  fL   MCH 29.9 26.0 - 34.0 pg   MCHC 35.4 30.0 - 36.0 g/dL   RDW 12.6 11.5 - 15.5 %   Platelets 269 150 - 400 K/uL   nRBC 0.0 0.0 - 0.2 %   Neutrophils Relative % 67 %   Neutro Abs 7.7 1.7 - 7.7 K/uL   Lymphocytes Relative 22 %   Lymphs Abs 2.4 0.7 - 4.0 K/uL   Monocytes Relative 7 %   Monocytes Absolute 0.8 0.1 - 1.0 K/uL   Eosinophils Relative 2 %   Eosinophils Absolute 0.2 0.0 - 0.5 K/uL   Basophils Relative 1 %   Basophils Absolute 0.1 0.0 - 0.1 K/uL   Immature Granulocytes 1 %   Abs Immature Granulocytes 0.10 (H) 0.00 - 0.07 K/uL  Basic metabolic panel     Status: Abnormal   Collection Time: 12/05/21  2:39 PM  Result Value Ref Range   Sodium 139 135 - 145 mmol/L   Potassium 3.8 3.5 - 5.1 mmol/L   Chloride 107 98 - 111 mmol/L   CO2 23 22 - 32 mmol/L   Glucose, Bld 107 (H) 70 - 99 mg/dL   BUN 15 8 - 23 mg/dL   Creatinine, Ser 1.66 (H) 0.61 - 1.24 mg/dL   Calcium 8.5 (L) 8.9 - 10.3 mg/dL   GFR, Estimated 47 (L) >60 mL/min   Anion gap 9 5 - 15  Basic metabolic panel     Status: Abnormal   Collection Time: 12/06/21  3:06 AM  Result Value Ref Range   Sodium 137 135 - 145 mmol/L   Potassium 4.1 3.5 - 5.1 mmol/L   Chloride 106 98 - 111 mmol/L   CO2 23 22 - 32 mmol/L   Glucose, Bld 163 (H) 70 - 99 mg/dL   BUN 18 8 - 23 mg/dL   Creatinine, Ser 1.51 (H) 0.61 - 1.24 mg/dL   Calcium 8.0 (L) 8.9 - 10.3 mg/dL   GFR, Estimated 52 (L) >60 mL/min   Anion gap 8 5 - 15     Imaging or Labs ordered: None  Medical history and chart was reviewed and case discussed with medical provider.  Assessment/Plan: 61 year old male with left type IIIa open distal tibia and fibula fracture.  Patient requires formal open reduction internal fixation.  His swelling appears to be decent this morning.  I will reassess tomorrow and tentatively place him on the surgery schedule for open reduction internal fixation.  I discussed risks and benefits with the patient and his wife.  I discussed the highest  risk is infection and wound healing problems and need for soft tissue coverage, I also discussed the possibility of posttraumatic arthritis ankle stiffness, I discussed the routine postoperative course including nonweightbearing precautions as well as potential return to food full duty work if there were no complications.  After full discussion the patient and his wife agreed to proceed will recheck swelling first thing tomorrow morning.  I placed him n.p.o. and tentatively placed him on the surgery  schedule.  Roby Lofts, MD [facility] [phone] (office) [email or web address]

## 2021-12-06 NOTE — Progress Notes (Signed)
   Subjective:  Patient reports pain as moderate controlled with pain medication.  Denies any numbness in the left toes.  States that the pain has been overall improved since external fixation.  Foley catheter placed overnight for inability to void.  Objective:   VITALS:   Vitals:   12/05/21 2245 12/05/21 2300 12/05/21 2331 12/06/21 0525  BP: (!) 142/83 125/82 136/71 126/76  Pulse: 85 90 91 92  Resp: (!) 9 13  18   Temp:   98 F (36.7 C) 98.6 F (37 C)  TempSrc:   Oral Oral  SpO2: 94% 93% 94% 97%  Weight:      Height:        Ex-Fix sites are clean dry intact with pin site dressing.  Compartments are compressible.  There is some wrinkling about the ankle joint in terms of swelling.  He has sensation intact in the dorsal aspect of the foot in the DP and SP distributions.  Sensation intact in the tibial distribution.  He is generally able to fire extensor and flexor great toe.  2+ dorsalis pedis pulse.   Lab Results  Component Value Date   WBC 11.1 (H) 12/05/2021   HGB 16.0 12/05/2021   HCT 45.2 12/05/2021   MCV 84.3 12/05/2021   PLT 269 12/05/2021     Assessment/Plan:  1 Day Post-Op status post left open pilon fracture external fixation and irrigation and debridement.  Overall no concern for compartment syndrome at this time  - Expected postop acute blood loss anemia - will monitor for symptoms - Patient to work with PT to optimize mobilization safely - DVT ppx - SCDs, ambulation, Lovenox daily - Postoperative Abx: Ancef x 2 additional doses given - NWB operative extremity - Pain control - multimodal pain management, ATC acetaminophen in conjunction with as needed narcotic (oxycodone), although this should be minimized with other modalities  -Appreciate urology consultation we will plan to keep Foley in until definitive fixation is performed given his issues with voiding.  Will defer to urology team for removal of this.   -Plan for skin check today with Dr. 12/07/2021 possible  n.p.o. at midnight for definitive fixation if swelling is adequate   [facility] 12/06/2021, 6:39 AM

## 2021-12-07 ENCOUNTER — Other Ambulatory Visit: Payer: Self-pay

## 2021-12-07 ENCOUNTER — Inpatient Hospital Stay (HOSPITAL_COMMUNITY): Payer: 59

## 2021-12-07 ENCOUNTER — Inpatient Hospital Stay (HOSPITAL_COMMUNITY): Payer: Worker's Compensation | Admitting: Anesthesiology

## 2021-12-07 ENCOUNTER — Encounter (HOSPITAL_COMMUNITY)
Admission: EM | Disposition: A | Discharge status: Home / Self Care | Payer: Self-pay | Source: Home / Self Care | Attending: Student

## 2021-12-07 ENCOUNTER — Encounter (HOSPITAL_COMMUNITY): Payer: Self-pay | Admitting: Orthopaedic Surgery

## 2021-12-07 DIAGNOSIS — S82871B Displaced pilon fracture of right tibia, initial encounter for open fracture type I or II: Secondary | ICD-10-CM

## 2021-12-07 HISTORY — PX: OPEN REDUCTION INTERNAL FIXATION (ORIF) TIBIA/FIBULA FRACTURE: SHX5992

## 2021-12-07 LAB — SURGICAL PCR SCREEN
MRSA, PCR: NEGATIVE
Staphylococcus aureus: NEGATIVE

## 2021-12-07 SURGERY — OPEN REDUCTION INTERNAL FIXATION (ORIF) TIBIA/FIBULA FRACTURE
Anesthesia: General | Laterality: Left

## 2021-12-07 MED ORDER — DEXAMETHASONE SODIUM PHOSPHATE 10 MG/ML IJ SOLN
INTRAMUSCULAR | Status: DC | PRN
  Administered 2021-12-07: 5 mg via INTRAVENOUS

## 2021-12-07 MED ORDER — ACETAMINOPHEN 500 MG PO TABS
ORAL_TABLET | ORAL | Status: AC
  Administered 2021-12-07: 1000 mg via ORAL
  Filled 2021-12-07: qty 2

## 2021-12-07 MED ORDER — VANCOMYCIN HCL 1000 MG IV SOLR
INTRAVENOUS | Status: AC
  Filled 2021-12-07: qty 20

## 2021-12-07 MED ORDER — ONDANSETRON HCL 4 MG/2ML IJ SOLN
INTRAMUSCULAR | Status: AC
  Filled 2021-12-07: qty 2

## 2021-12-07 MED ORDER — ROCURONIUM BROMIDE 10 MG/ML (PF) SYRINGE
PREFILLED_SYRINGE | INTRAVENOUS | Status: DC | PRN
  Administered 2021-12-07: 100 mg via INTRAVENOUS
  Administered 2021-12-07: 10 mg via INTRAVENOUS

## 2021-12-07 MED ORDER — FENTANYL CITRATE (PF) 250 MCG/5ML IJ SOLN
INTRAMUSCULAR | Status: AC
  Filled 2021-12-07: qty 5

## 2021-12-07 MED ORDER — 0.9 % SODIUM CHLORIDE (POUR BTL) OPTIME
TOPICAL | Status: DC | PRN
  Administered 2021-12-07: 1000 mL

## 2021-12-07 MED ORDER — SUGAMMADEX SODIUM 200 MG/2ML IV SOLN
INTRAVENOUS | Status: DC | PRN
  Administered 2021-12-07: 200 mg via INTRAVENOUS

## 2021-12-07 MED ORDER — MIDAZOLAM HCL 2 MG/2ML IJ SOLN
INTRAMUSCULAR | Status: AC
  Administered 2021-12-07: 2 mg via INTRAVENOUS
  Filled 2021-12-07: qty 2

## 2021-12-07 MED ORDER — FENTANYL CITRATE (PF) 100 MCG/2ML IJ SOLN: 100.0000 ug | Freq: Once | INTRAMUSCULAR | Status: AC

## 2021-12-07 MED ORDER — MIDAZOLAM HCL 2 MG/2ML IJ SOLN
INTRAMUSCULAR | Status: AC
  Filled 2021-12-07: qty 2

## 2021-12-07 MED ORDER — ENOXAPARIN SODIUM 40 MG/0.4ML IJ SOSY
40.0000 mg | PREFILLED_SYRINGE | INTRAMUSCULAR | Status: DC
  Administered 2021-12-08 – 2021-12-10 (×3): 40 mg via SUBCUTANEOUS
  Filled 2021-12-07 (×3): qty 0.4

## 2021-12-07 MED ORDER — ONDANSETRON HCL 4 MG/2ML IJ SOLN: 4.0000 mg | Freq: Once | INTRAMUSCULAR | Status: DC | PRN

## 2021-12-07 MED ORDER — PROPOFOL 10 MG/ML IV BOLUS
INTRAVENOUS | Status: AC
  Filled 2021-12-07: qty 20

## 2021-12-07 MED ORDER — ACETAMINOPHEN 500 MG PO TABS: 1000.0000 mg | ORAL_TABLET | Freq: Once | ORAL | Status: AC

## 2021-12-07 MED ORDER — LIDOCAINE 2% (20 MG/ML) 5 ML SYRINGE
INTRAMUSCULAR | Status: DC | PRN
  Administered 2021-12-07: 100 mg via INTRAVENOUS

## 2021-12-07 MED ORDER — ROCURONIUM BROMIDE 10 MG/ML (PF) SYRINGE
PREFILLED_SYRINGE | INTRAVENOUS | Status: AC
  Filled 2021-12-07: qty 10

## 2021-12-07 MED ORDER — CHLORHEXIDINE GLUCONATE 0.12 % MT SOLN: 15.0000 mL | Freq: Once | OROMUCOSAL | Status: AC

## 2021-12-07 MED ORDER — FENTANYL CITRATE (PF) 100 MCG/2ML IJ SOLN
INTRAMUSCULAR | Status: AC
  Administered 2021-12-07: 100 ug via INTRAVENOUS
  Filled 2021-12-07: qty 2

## 2021-12-07 MED ORDER — ORAL CARE MOUTH RINSE
15.0000 mL | Freq: Once | OROMUCOSAL | Status: AC
Start: 2021-12-07 — End: 2021-12-07

## 2021-12-07 MED ORDER — DEXAMETHASONE SODIUM PHOSPHATE 10 MG/ML IJ SOLN
INTRAMUSCULAR | Status: DC | PRN
  Administered 2021-12-07: 4 mg

## 2021-12-07 MED ORDER — AMISULPRIDE (ANTIEMETIC) 5 MG/2ML IV SOLN: 10.0000 mg | Freq: Once | INTRAVENOUS | Status: DC | PRN

## 2021-12-07 MED ORDER — ROPIVACAINE HCL 5 MG/ML IJ SOLN
INTRAMUSCULAR | Status: DC | PRN
  Administered 2021-12-07: 30 mL via PERINEURAL
  Administered 2021-12-07: 20 mL via PERINEURAL

## 2021-12-07 MED ORDER — CHLORHEXIDINE GLUCONATE 0.12 % MT SOLN
OROMUCOSAL | Status: AC
  Administered 2021-12-07: 15 mL via OROMUCOSAL
  Filled 2021-12-07: qty 15

## 2021-12-07 MED ORDER — SODIUM CHLORIDE 0.9 % IR SOLN
Status: DC | PRN
  Administered 2021-12-07: 3000 mL

## 2021-12-07 MED ORDER — OXYCODONE HCL 5 MG/5ML PO SOLN: 5.0000 mg | Freq: Once | ORAL | Status: DC | PRN

## 2021-12-07 MED ORDER — PROPOFOL 10 MG/ML IV BOLUS
INTRAVENOUS | Status: DC | PRN
  Administered 2021-12-07: 150 mg via INTRAVENOUS

## 2021-12-07 MED ORDER — LIDOCAINE 2% (20 MG/ML) 5 ML SYRINGE
INTRAMUSCULAR | Status: AC
  Filled 2021-12-07: qty 10

## 2021-12-07 MED ORDER — LACTATED RINGERS IV SOLN: INTRAVENOUS | Status: DC

## 2021-12-07 MED ORDER — FENTANYL CITRATE (PF) 100 MCG/2ML IJ SOLN: 25.0000 ug | INTRAMUSCULAR | Status: DC | PRN

## 2021-12-07 MED ORDER — ONDANSETRON HCL 4 MG/2ML IJ SOLN
INTRAMUSCULAR | Status: DC | PRN
  Administered 2021-12-07: 4 mg via INTRAVENOUS

## 2021-12-07 MED ORDER — MIDAZOLAM HCL 2 MG/2ML IJ SOLN
INTRAMUSCULAR | Status: DC | PRN
  Administered 2021-12-07: 2 mg via INTRAVENOUS

## 2021-12-07 MED ORDER — OXYCODONE HCL 5 MG PO TABS: 5.0000 mg | ORAL_TABLET | Freq: Once | ORAL | Status: DC | PRN

## 2021-12-07 MED ORDER — DEXAMETHASONE SODIUM PHOSPHATE 10 MG/ML IJ SOLN
INTRAMUSCULAR | Status: AC
  Filled 2021-12-07: qty 1

## 2021-12-07 MED ORDER — VANCOMYCIN HCL 1000 MG IV SOLR
INTRAVENOUS | Status: DC | PRN
  Administered 2021-12-07: 1000 mg via TOPICAL

## 2021-12-07 MED ORDER — MIDAZOLAM HCL 2 MG/2ML IJ SOLN: 2.0000 mg | Freq: Once | INTRAMUSCULAR | Status: AC

## 2021-12-07 SURGICAL SUPPLY — 79 items
APL PRP STRL LF DISP 70% ISPRP (MISCELLANEOUS) ×1
BAG COUNTER SPONGE SURGICOUNT (BAG) ×2 IMPLANT
BAG SPNG CNTER NS LX DISP (BAG) ×1
BANDAGE ESMARK 6X9 LF (GAUZE/BANDAGES/DRESSINGS) ×1 IMPLANT
BIT DRILL 2.5X2.75 QC CALB (BIT) ×1 IMPLANT
BIT DRILL CALIBRATED 2.7 (BIT) ×1 IMPLANT
BNDG CMPR 9X6 STRL LF SNTH (GAUZE/BANDAGES/DRESSINGS) ×1
BNDG CMPR MED 10X6 ELC LF (GAUZE/BANDAGES/DRESSINGS) ×1
BNDG COHESIVE 4X5 TAN STRL (GAUZE/BANDAGES/DRESSINGS) ×2 IMPLANT
BNDG ELASTIC 4X5.8 VLCR STR LF (GAUZE/BANDAGES/DRESSINGS) ×1 IMPLANT
BNDG ELASTIC 6X10 VLCR STRL LF (GAUZE/BANDAGES/DRESSINGS) ×1 IMPLANT
BNDG ESMARK 6X9 LF (GAUZE/BANDAGES/DRESSINGS) ×2
BRUSH SCRUB EZ PLAIN DRY (MISCELLANEOUS) ×4 IMPLANT
CHLORAPREP W/TINT 26 (MISCELLANEOUS) ×2 IMPLANT
COVER SURGICAL LIGHT HANDLE (MISCELLANEOUS) ×2 IMPLANT
DRAPE C-ARM 42X72 X-RAY (DRAPES) ×2 IMPLANT
DRAPE C-ARMOR (DRAPES) ×2 IMPLANT
DRAPE ORTHO SPLIT 77X108 STRL (DRAPES) ×4
DRAPE SURG ORHT 6 SPLT 77X108 (DRAPES) ×2 IMPLANT
DRAPE U-SHAPE 47X51 STRL (DRAPES) ×2 IMPLANT
DRSG MEPITEL 4X7.2 (GAUZE/BANDAGES/DRESSINGS) ×1 IMPLANT
ELECT REM PT RETURN 9FT ADLT (ELECTROSURGICAL) ×2
ELECTRODE REM PT RTRN 9FT ADLT (ELECTROSURGICAL) ×1 IMPLANT
GAUZE SPONGE 4X4 12PLY STRL (GAUZE/BANDAGES/DRESSINGS) ×1 IMPLANT
GLOVE BIO SURGEON STRL SZ 6.5 (GLOVE) ×6 IMPLANT
GLOVE BIO SURGEON STRL SZ7.5 (GLOVE) ×6 IMPLANT
GLOVE BIOGEL PI IND STRL 6.5 (GLOVE) ×1 IMPLANT
GLOVE BIOGEL PI IND STRL 7.5 (GLOVE) ×1 IMPLANT
GLOVE BIOGEL PI INDICATOR 6.5 (GLOVE) ×1
GLOVE BIOGEL PI INDICATOR 7.5 (GLOVE) ×1
GOWN STRL REUS W/ TWL LRG LVL3 (GOWN DISPOSABLE) ×2 IMPLANT
GOWN STRL REUS W/TWL LRG LVL3 (GOWN DISPOSABLE) ×4
HANDPIECE INTERPULSE COAX TIP (DISPOSABLE) ×2
K-WIRE ACE 1.6X6 (WIRE) ×2
KIT TURNOVER KIT B (KITS) ×2 IMPLANT
KWIRE ACE 1.6X6 (WIRE) IMPLANT
MANIFOLD NEPTUNE II (INSTRUMENTS) ×2 IMPLANT
NDL HYPO 25GX1X1/2 BEV (NEEDLE) ×1 IMPLANT
NEEDLE HYPO 25GX1X1/2 BEV (NEEDLE) ×2 IMPLANT
NS IRRIG 1000ML POUR BTL (IV SOLUTION) ×2 IMPLANT
PACK TOTAL JOINT (CUSTOM PROCEDURE TRAY) ×2 IMPLANT
PAD ARMBOARD 7.5X6 YLW CONV (MISCELLANEOUS) ×4 IMPLANT
PAD CAST 4YDX4 CTTN HI CHSV (CAST SUPPLIES) IMPLANT
PADDING CAST COTTON 4X4 STRL (CAST SUPPLIES) ×2
PADDING CAST COTTON 6X4 STRL (CAST SUPPLIES) ×1 IMPLANT
PLATE 12H RT DIST ANTLAT TIB (Plate) ×1 IMPLANT
SCREW CORTICAL 3.5MM  28MM (Screw) ×8 IMPLANT
SCREW CORTICAL 3.5MM  30MM (Screw) ×2 IMPLANT
SCREW CORTICAL 3.5MM  34MM (Screw) ×2 IMPLANT
SCREW CORTICAL 3.5MM  44MM (Screw) ×4 IMPLANT
SCREW CORTICAL 3.5MM 28MM (Screw) IMPLANT
SCREW CORTICAL 3.5MM 30MM (Screw) IMPLANT
SCREW CORTICAL 3.5MM 34MM (Screw) IMPLANT
SCREW CORTICAL 3.5MM 44MM (Screw) IMPLANT
SCREW LOCK CORT STAR 3.5X34 (Screw) ×1 IMPLANT
SCREW LOCK CORT STAR 3.5X46 (Screw) ×1 IMPLANT
SCREW T15 LP CORT 3.5X40MM NS (Screw) ×1 IMPLANT
SCREW T15 LP CORT 3.5X46MM NS (Screw) ×1 IMPLANT
SCREW T15 LP CORT 3.5X48MM NS (Screw) ×1 IMPLANT
SET HNDPC FAN SPRY TIP SCT (DISPOSABLE) IMPLANT
SPLINT PLASTER CAST XFAST 5X30 (CAST SUPPLIES) IMPLANT
SPLINT PLASTER XFAST SET 5X30 (CAST SUPPLIES) ×1
SPONGE T-LAP 18X18 ~~LOC~~+RFID (SPONGE) ×1 IMPLANT
STAPLER VISISTAT 35W (STAPLE) ×2 IMPLANT
SUCTION FRAZIER HANDLE 10FR (MISCELLANEOUS) ×2
SUCTION TUBE FRAZIER 10FR DISP (MISCELLANEOUS) ×1 IMPLANT
SUT ETHILON 3 0 PS 1 (SUTURE) ×10 IMPLANT
SUT MON AB 2-0 CT1 36 (SUTURE) ×1 IMPLANT
SUT PROLENE 0 CT (SUTURE) ×1 IMPLANT
SUT VIC AB 0 CT1 27 (SUTURE) ×2
SUT VIC AB 0 CT1 27XBRD ANBCTR (SUTURE) ×1 IMPLANT
SUT VIC AB 2-0 CT1 27 (SUTURE) ×4
SUT VIC AB 2-0 CT1 TAPERPNT 27 (SUTURE) ×2 IMPLANT
SUT VIC AB 2-0 CT2 18 VCP726D (SUTURE) ×1 IMPLANT
SYR CONTROL 10ML LL (SYRINGE) ×2 IMPLANT
TOWEL GREEN STERILE (TOWEL DISPOSABLE) ×4 IMPLANT
TOWEL GREEN STERILE FF (TOWEL DISPOSABLE) ×2 IMPLANT
UNDERPAD 30X36 HEAVY ABSORB (UNDERPADS AND DIAPERS) ×2 IMPLANT
WATER STERILE IRR 1000ML POUR (IV SOLUTION) ×2 IMPLANT

## 2021-12-07 NOTE — Op Note (Signed)
Orthopaedic Surgery Operative Note (CSN: [id number] ) Date of Surgery: 12/07/2021  Admit Date: 12/05/2021   Diagnoses: Pre-Op Diagnoses: Left open pilon and tibial shaft fracture  Post-Op Diagnosis: Same  Procedures: CPT 27827-Open reduction internal fixation of left pilon fracture CPT 27758-Open reduction internal fixation of left tibial shaft fracture CPT 11012-Irrigation and debridement of left open tibia fracture CPT 20694-Removal of external fixation from left ankle  Surgeons : Primary: Roby Lofts, MD  Assistant: Darron Doom, RNFA  Location: OR 3   Anesthesia:General with regional block   Antibiotics: Ceftriaxone 2gm scheduled and 1 gm vancomycin powder placed topically   Tourniquet time: None used    Estimated Blood Loss:100 mL  Complications:None   Specimens:None  Implants: Implant Name Type Inv. Item Serial No. Manufacturer Lot No. LRB No. Used Action  SCREW T15 LP CORT 3.5X40MM NS - [id number] Screw SCREW T15 LP CORT 3.5X40MM NS  [employer](ORTH,TRAU,BIO,SG)  Left 1 Implanted  SCREW T15 LP CORT 3.5X48MM NS - [id number] Screw SCREW T15 LP CORT 3.5X48MM NS  [employer](ORTH,TRAU,BIO,SG)  Left 1 Implanted  PLATE 92J RT DIST ANTLAT TIB - [id number] Plate PLATE 44Y RT DIST ANTLAT TIB  [employer](ORTH,TRAU,BIO,SG)  Left 1 Implanted  SCREW CORTICAL 3.5MM  - [id number] Screw SCREW CORTICAL 3.5MM   [employer](ORTH,TRAU,BIO,SG)  Left 3 Implanted  SCREW CORTICAL 3.5MM  - [id number] Screw SCREW CORTICAL 3.5MM   [employer](ORTH,TRAU,BIO,SG)  Left 1 Implanted  SCREW LOCK CORT STAR 3.5X46 - [id number] Screw SCREW LOCK CORT STAR 3.5X46  [employer](ORTH,TRAU,BIO,SG)  Left 1 Implanted  SCREW T15 LP CORT 3.5X46MM NS - [id number] Screw SCREW T15 LP CORT 3.5X46MM NS  [employer](ORTH,TRAU,BIO,SG)  Left 1 Implanted  SCREW LOCK CORT STAR 3.5X34 - [id number] Screw SCREW LOCK CORT STAR 3.5X34  [employer](ORTH,TRAU,BIO,SG)  Left 1 Implanted  SCREW CORTICAL  3.5MM  - [id number] Screw SCREW CORTICAL 3.5MM   [employer](ORTH,TRAU,BIO,SG)  Left 1 Implanted  SCREW CORTICAL 3.5MM  - [id number] Screw SCREW CORTICAL 3.5MM   [employer](ORTH,TRAU,BIO,SG)  Left 2 Implanted     Indications for Surgery: 61 year old male who fell from a ladder sustaining a left open tibial shaft and distal tibia pilon fracture.  He underwent irrigation and debridement and external fixation with Dr. Steward Drone.  I took over care for definitive fixation.  Discussed risk and benefits of proceeding with surgical fixation.  Risks include but not limited to bleeding, infection, malunion, nonunion, hardware failure, hardware irritation, nerve or blood vessel injury, wound breakdown, need for soft tissue coverage, posttraumatic arthritis, ankle stiffness, even the possibility anesthetic complications.  He agreed to proceed with surgery and consent was obtained.  Operative Findings: 1.  Repeat irrigation debridement of type IIIA/B open tibial shaft/pilon fracture with a removal of spanning ankle external fixation 2.  Open reduction internal fixation of left tibial shaft and intra-articular pilon fracture using [employer] anterior lateral ALPS distal tibial locking plate  Procedure: The patient was identified in the preoperative holding area. Consent was confirmed with the patient and their family and all questions were answered. The operative extremity was marked after confirmation with the patient. he was then brought back to the operating room by our anesthesia colleagues.  He was placed under general anesthetic and carefully transferred over to radiolucent flat top table.  A bump was placed under his operative hip left lower extremity was then prepped and draped in usual sterile fashion.  The external fixator was prepped into the  field.  A timeout was performed to verify the patient, the procedure, and the extremity.  Preoperative antibiotics were dosed.  For started out  by loosening the Ex-Fix and removing the bars and clamps in place and these on the back table for potential later use.  I reopened the traumatic laceration and extended this distally to be able to access the coronal split into the tibial plafond.  I then delivered the tibial shaft through the wound to be able access the hand.  I used rongeur and curette to debride the bone ends.  I then used a low-pressure pulsatile lavage to thoroughly irrigate the wound.  I used approximately 3 L normal saline.  I then changed gloves and instruments and turned my attention to fixation of the tibial shaft and pilon fracture.  Through the medial approach I was able to see the coronal split of the articular surface and I used a small reduction tenaculum to reduce it anatomically.  There was a posterior extension of the metaphysis that connected to the tibial shaft that had a good cortical read.  Manipulate the tibial shaft into reduction and held provisionally with a reduction tenaculum I confirmed adequate reduction with fluoroscopy.  I then made a small anterior lateral incision with approximately 7 cm skin bridge between the traumatic laceration.  Made it about 6 cm in size to prevent disruption of the lateral blood supply to the traumatic laceration.  I carried it down through skin subcutaneous tissue.  I identified a branch of the superficial peroneal nerve and protected this throughout the case.  I then incised through the extensor retinaculum.  I then mobilized the extensor muscle belly and tendons to visualize the distal part of the tibia.  I mobilized the muscle to access a soft tissue path for the proximal tibia.  I then directed a 3.5 millimeter screw from anterior to posterior to hold the coronal split using lateral fluoroscopic imaging.  I then chose a [employer] anterior lateral ALPS distal tibial locking plate.  I slid this submuscularly along the cortex of the tibia.  Positioned appropriately on the distal  tibia.  I held provisionally with a K wire and confirmed adequate reduction and alignment and placement with fluoroscopy.  I then placed a nonlocking screw into the metaphysis to bring the plate flush to bone.  I then percutaneously placed four 3.5 millimeter screws into the tibial shaft.  I returned to the distal segment and proceeded to place a mixture of nonlocking and locking screws to reinforce the fixation distally.  I was able to remove my clamps and obtained final fluoroscopic imaging.  The incisions were copiously irrigated.  A gram of vancomycin powder was placed into the incisions.  A layered closure for the anterior lateral incision and consisted of 0 Vicryl, 2-0 Vicryl and 3-0 nylon.  The traumatic laceration was closed with 3-0 nylon.  The percutaneous incisions were closed with 3-0 nylon.  I then removed the Ex-Fix pins.  I debrided the Ex-Fix pin sites with a curette and closed them with 3-0 nylon suture.  Sterile dressing consisting of Mepitel, 4 x 4's sterile cast padding and a well-padded short leg splint was applied.  The patient was then awoke from anesthesia and taken to the PACU in stable condition.   Debridement type: Excisional Debridement  Side: left  Body Location: tibia   Tools used for debridement: scalpel, scissors, curette, and rongeur  Pre-debridement Wound size (cm):   Length: 5  Width: 3     Depth: 1   Post-debridement Wound size (cm):   N/A-closed  Debridement depth beyond dead/damaged tissue down to healthy viable tissue: yes  Tissue layer involved: skin, subcutaneous tissue, muscle / fascia, bone  Nature of tissue removed: Devitalized Tissue and Non-viable tissue  Irrigation volume: 3L     Irrigation fluid type: Normal Saline   Post Op Plan/Instructions: Patient will be nonweightbearing to the left lower extremity.  He will receive postoperative ceftriaxone for open physical and Occupational Therapy.  We will plan to potentially discharge him  home tomorrow versus Sunday depending on mobilization.  I was present and performed the entire surgery.  Katha Hamming, MD [facility]

## 2021-12-07 NOTE — Anesthesia Procedure Notes (Signed)
Procedure Name: Intubation Date/Time: 12/07/2021 10:38 AM  Performed by: Leonor Liv, CRNAPre-anesthesia Checklist: Patient identified, Emergency Drugs available, Suction available and Patient being monitored Patient Re-evaluated:Patient Re-evaluated prior to induction Oxygen Delivery Method: Circle System Utilized Preoxygenation: Pre-oxygenation with 100% oxygen Induction Type: IV induction Ventilation: Oral airway inserted - appropriate to patient size and Two handed mask ventilation required Laryngoscope Size: Mac and 4 Grade View: Grade I Tube type: Oral Tube size: 7.5 mm Number of attempts: 1 Airway Equipment and Method: Stylet and Oral airway Placement Confirmation: ETT inserted through vocal cords under direct vision, positive ETCO2 and breath sounds checked- equal and bilateral Secured at: 23 cm Tube secured with: Tape Dental Injury: Teeth and Oropharynx as per pre-operative assessment

## 2021-12-07 NOTE — Interval H&P Note (Signed)
History and Physical Interval Note:  12/07/2021 9:47 AM  Kenneth Calhoun  has presented today for surgery, with the diagnosis of Left open pilon fracture.  The various methods of treatment have been discussed with the patient and family. After consideration of risks, benefits and other options for treatment, the patient has consented to  Procedure(s): OPEN REDUCTION INTERNAL FIXATION (ORIF) PILON FRACTURE (Left) as a surgical intervention.  The patient's history has been reviewed, patient examined, no change in status, stable for surgery.  I have reviewed the patient's chart and labs.  Questions were answered to the patient's satisfaction.     Caryn Bee P Lynsi Dooner

## 2021-12-07 NOTE — Progress Notes (Signed)
Ortho Trauma Note  Swelling present, pretty significant over medial side however, anterolateral portion is wrinkling to proceed with ORIF. Discussed risks and benefits again with patient and wife and will proceed with ORIF today.  Roby Lofts, MD [facility] [phone] (office) [email or web address]

## 2021-12-07 NOTE — Anesthesia Postprocedure Evaluation (Signed)
Anesthesia Post Note  Patient: Kenneth Calhoun  Procedure(s) Performed: OPEN REDUCTION INTERNAL FIXATION (ORIF) PILON FRACTURE (Left)     Patient location during evaluation: PACU Anesthesia Type: General Level of consciousness: awake and alert Pain management: pain level controlled Vital Signs Assessment: post-procedure vital signs reviewed and stable Respiratory status: spontaneous breathing, nonlabored ventilation and respiratory function stable Cardiovascular status: blood pressure returned to baseline and stable Postop Assessment: no apparent nausea or vomiting Anesthetic complications: no   No notable events documented.  Last Vitals:  Vitals:   12/07/21 1345 12/07/21 1552  BP: 139/73 (!) 144/78  Pulse: 83 84  Resp: 12 16  Temp: 36.6 C 36.9 C  SpO2: 92% 97%    Last Pain:  Vitals:   12/07/21 1552  TempSrc: Oral  PainSc:                  Lucretia Kern

## 2021-12-07 NOTE — Progress Notes (Signed)
Patient arrived back to 6 north room 3 alert and oriented x4 pain level 0/10 due to block. Bed in lowest position, call light in reach, wife at bedside. Will continue to monitor patient.

## 2021-12-07 NOTE — Anesthesia Preprocedure Evaluation (Addendum)
Anesthesia Evaluation  Patient identified by MRN, date of birth, ID band Patient awake    Reviewed: Allergy & Precautions, NPO status , Patient's Chart, lab work & pertinent test results  History of Anesthesia Complications Negative for: history of anesthetic complications  Airway Mallampati: IV  TM Distance: >3 FB Neck ROM: Full    Dental  (+) Dental Advisory Given, Teeth Intact   Pulmonary neg pulmonary ROS,    Pulmonary exam normal        Cardiovascular negative cardio ROS Normal cardiovascular exam     Neuro/Psych negative neurological ROS     GI/Hepatic negative GI ROS, Neg liver ROS,   Endo/Other  negative endocrine ROS  Renal/GU Renal disease (Cr 1.51)  negative genitourinary   Musculoskeletal negative musculoskeletal ROS (+)   Abdominal   Peds  Hematology negative hematology ROS (+)   Anesthesia Other Findings Fall from ladder with open pilon fracture s/p ex fix  Reproductive/Obstetrics                          Anesthesia Physical Anesthesia Plan  ASA: 2  Anesthesia Plan: General   Post-op Pain Management: Regional block* and Tylenol PO (pre-op)*   Induction: Intravenous  PONV Risk Score and Plan: 2 and Ondansetron, Dexamethasone, Treatment may vary due to age or medical condition and Midazolam  Airway Management Planned: Oral ETT  Additional Equipment: None  Intra-op Plan:   Post-operative Plan: Extubation in OR  Informed Consent: I have reviewed the patients History and Physical, chart, labs and discussed the procedure including the risks, benefits and alternatives for the proposed anesthesia with the patient or authorized representative who has indicated his/her understanding and acceptance.     Dental advisory given  Plan Discussed with:   Anesthesia Plan Comments:        Anesthesia Quick Evaluation

## 2021-12-07 NOTE — Anesthesia Procedure Notes (Signed)
Anesthesia Regional Block: Adductor canal block   Pre-Anesthetic Checklist: , timeout performed,  Correct Patient, Correct Site, Correct Laterality,  Correct Procedure, Correct Position, site marked,  Risks and benefits discussed,  Surgical consent,  Pre-op evaluation,  At surgeon's request and post-op pain management  Laterality: Left  Prep: chloraprep       Needles:  Injection technique: Single-shot  Needle Type: Echogenic Stimulator Needle     Needle Length: 10cm  Needle Gauge: 20     Additional Needles:   Procedures:,,,, ultrasound used (permanent image in chart),,    Narrative:  Start time: 12/07/2021 9:35 AM End time: 12/07/2021 9:39 AM Injection made incrementally with aspirations every 5 mL.  Performed by: Personally  Anesthesiologist: Lucretia Kern, MD  Additional Notes: Standard monitors applied. Skin prepped. Good needle visualization with ultrasound. Injection made in 5cc increments with no resistance to injection. Patient tolerated the procedure well.

## 2021-12-07 NOTE — Discharge Instructions (Signed)
Orthopaedic Trauma Service Discharge Instructions   General Discharge Instructions  Orthopaedic Injuries:  Left open pilon fracture  WEIGHT BEARING STATUS:Nonweightbearing  RANGE OF MOTION/ACTIVITY:Unrestricted knee motion, splint to left ankle  Review the following resource for additional information regarding bone health  [email or web address]  Wound Care:Keep splint clean and dry until follow up  DVT/PE prophylaxis:Aspirin 325 mg daily  Diet: as you were eating previously.  Can use over the counter stool softeners and bowel preparations, such as Miralax, to help with bowel movements.  Narcotics can be constipating.  Be sure to drink plenty of fluids  PAIN MEDICATION USE AND EXPECTATIONS  You have likely been given narcotic medications to help control your pain.  After a traumatic event that results in an fracture (broken bone) with or without surgery, it is ok to use narcotic pain medications to help control one's pain.  We understand that everyone responds to pain differently and each individual patient will be evaluated on a regular basis for the continued need for narcotic medications. Ideally, narcotic medication use should last no more than 6-8 weeks (coinciding with fracture healing).   As a patient it is your responsibility as well to monitor narcotic medication use and report the amount and frequency you use these medications when you come to your office visit.   We would also advise that if you are using narcotic medications, you should take a dose prior to therapy to maximize you participation.  IF YOU ARE ON NARCOTIC MEDICATIONS IT IS NOT PERMISSIBLE TO OPERATE A MOTOR VEHICLE (MOTORCYCLE/CAR/TRUCK/MOPED) OR HEAVY MACHINERY DO NOT MIX NARCOTICS WITH OTHER CNS (CENTRAL NERVOUS SYSTEM) DEPRESSANTS SUCH AS ALCOHOL   POST-OPERATIVE OPIOID TAPER INSTRUCTIONS: It is important to wean off of your opioid medication as soon as possible. If you do not need  pain medication after your surgery it is ok to stop day one. Opioids include: Codeine, Hydrocodone(Norco, Vicodin), Oxycodone(Percocet, oxycontin) and hydromorphone amongst others.  Long term and even short term use of opiods can cause: Increased pain response Dependence Constipation Depression Respiratory depression And more.  Withdrawal symptoms can include Flu like symptoms Nausea, vomiting And more Techniques to manage these symptoms Hydrate well Eat regular healthy meals Stay active Use relaxation techniques(deep breathing, meditating, yoga) Do Not substitute Alcohol to help with tapering If you have been on opioids for less than two weeks and do not have pain than it is ok to stop all together.  Plan to wean off of opioids This plan should start within one week post op of your fracture surgery  Maintain the same interval or time between taking each dose and first decrease the dose.  Cut the total daily intake of opioids by one tablet each day Next start to increase the time between doses. The last dose that should be eliminated is the evening dose.    STOP SMOKING OR USING NICOTINE PRODUCTS!!!!  As discussed nicotine severely impairs your body's ability to heal surgical and traumatic wounds but also impairs bone healing.  Wounds and bone heal by forming microscopic blood vessels (angiogenesis) and nicotine is a vasoconstrictor (essentially, shrinks blood vessels).  Therefore, if vasoconstriction occurs to these microscopic blood vessels they essentially disappear and are unable to deliver necessary nutrients to the healing tissue.  This is one modifiable factor that you can do to dramatically increase your chances of healing your injury.    (This means no smoking, no nicotine gum, patches, etc)  DO NOT USE NONSTEROIDAL ANTI-INFLAMMATORY DRUGS (NSAID'S)  Using products  such as Advil (ibuprofen), Aleve (naproxen), Motrin (ibuprofen) for additional pain control during fracture  healing can delay and/or prevent the healing response.  If you would like to take over the counter (OTC) medication, Tylenol (acetaminophen) is ok.  However, some narcotic medications that are given for pain control contain acetaminophen as well. Therefore, you should not exceed more than 4000 mg of tylenol in a day if you do not have liver disease.  Also note that there are may OTC medicines, such as cold medicines and allergy medicines that my contain tylenol as well.  If you have any questions about medications and/or interactions please ask your doctor/PA or your pharmacist.      ICE AND ELEVATE INJURED/OPERATIVE EXTREMITY  Using ice and elevating the injured extremity above your heart can help with swelling and pain control.  Icing in a pulsatile fashion, such as 20 minutes on and 20 minutes off, can be followed.    Do not place ice directly on skin. Make sure there is a barrier between to skin and the ice pack.    Using frozen items such as frozen peas works well as the conform nicely to the are that needs to be iced.  USE AN ACE WRAP OR TED HOSE FOR SWELLING CONTROL  In addition to icing and elevation, Ace wraps or TED hose are used to help limit and resolve swelling.  It is recommended to use Ace wraps or TED hose until you are informed to stop.    When using Ace Wraps start the wrapping distally (farthest away from the body) and wrap proximally (closer to the body)   Example: If you had surgery on your leg or thing and you do not have a splint on, start the ace wrap at the toes and work your way up to the thigh        If you had surgery on your upper extremity and do not have a splint on, start the ace wrap at your fingers and work your way up to the upper arm  IF YOU ARE IN A SPLINT OR CAST DO NOT REMOVE IT FOR ANY REASON   If your splint gets wet for any reason please contact the office immediately. You may shower in your splint or cast as long as you keep it dry.  This can be done by  wrapping in a cast cover or garbage back (or similar)  Do Not stick any thing down your splint or cast such as pencils, money, or hangers to try and scratch yourself with.  If you feel itchy take benadryl as prescribed on the bottle for itching  IF YOU ARE IN A CAM BOOT (BLACK BOOT)  You may remove boot periodically. Perform daily dressing changes as noted below.  Wash the liner of the boot regularly and wear a sock when wearing the boot. It is recommended that you sleep in the boot until told otherwise    Call office for the following: Temperature greater than 101F Persistent nausea and vomiting Severe uncontrolled pain Redness, tenderness, or signs of infection (pain, swelling, redness, odor or green/yellow discharge around the site) Difficulty breathing, headache or visual disturbances Hives Persistent dizziness or light-headedness Extreme fatigue Any other questions or concerns you may have after discharge  In an emergency, call 911 or go to an Emergency Department at a nearby hospital  HELPFUL INFORMATION  If you had a block, it will wear off between 8-24 hrs postop typically.  This is period when your  pain may go from nearly zero to the pain you would have had postop without the block.  This is an abrupt transition but nothing dangerous is happening.  You may take an extra dose of narcotic when this happens.  You should wean off your narcotic medicines as soon as you are able.  Most patients will be off or using minimal narcotics before their first postop appointment.   We suggest you use the pain medication the first night prior to going to bed, in order to ease any pain when the anesthesia wears off. You should avoid taking pain medications on an empty stomach as it will make you nauseous.  Do not drink alcoholic beverages or take illicit drugs when taking pain medications.  In most states it is against the law to drive while you are in a splint or sling.  And certainly against  the law to drive while taking narcotics.  You may return to work/school in the next couple of days when you feel up to it.   Pain medication may make you constipated.  Below are a few solutions to try in this order: Decrease the amount of pain medication if you aren't having pain. Drink lots of decaffeinated fluids. Drink prune juice and/or each dried prunes  If the first 3 don't work start with additional solutions Take Colace - an over-the-counter stool softener Take Senokot - an over-the-counter laxative Take Miralax - a stronger over-the-counter laxative     CALL THE OFFICE WITH ANY QUESTIONS OR CONCERNS: [phone]   VISIT OUR WEBSITE FOR ADDITIONAL INFORMATION: [email or web address]   Discharge Wound Care Instructions  Do NOT apply any ointments, solutions or lotions to pin sites or surgical wounds.  These prevent needed drainage and even though solutions like hydrogen peroxide kill bacteria, they also damage cells lining the pin sites that help fight infection.  Applying lotions or ointments can keep the wounds moist and can cause them to breakdown and open up as well. This can increase the risk for infection. When in doubt call the office.  Surgical incisions should be dressed daily.  If any drainage is noted, use one layer of adaptic or Mepitel, then gauze, Kerlix, and an ace wrap.  Net[employer]per[employer]cz [email or web address]/?pd_rd_[email or web address]&[email or web address]  [email or web address]  These dressing supplies should be available at local medical supply stores ([facility] [employer], [facility], etc). They are not usually carried at places like [facility], [facility], [facility], etc  Once the incision is completely dry and without drainage, it may be left open to air out.  Showering may begin 36-48 hours later.   Cleaning gently with soap and water.  Traumatic wounds should be dressed daily as well.    One layer of adaptic, gauze, Kerlix, then ace wrap.  The adaptic can be discontinued once the draining has ceased    If you have a wet to dry dressing: wet the gauze with saline the squeeze as much saline out so the gauze is moist (not soaking wet), place moistened gauze over wound, then place a dry gauze over the moist one, followed by Kerlix wrap, then ace wrap.     Foley Catheter Care A soft, flexible tube (Foley catheter) may have been placed in your bladder to drain urine and fluid. Follow these instructions: Taking Care of the Catheter Keep the area where the catheter leaves your body clean.  Attach the catheter to the leg so there is no tension on the catheter.  Keep the drainage bag below the level of the bladder, but keep  it OFF the floor.  Do not take long soaking baths. Your caregiver will give instructions about showering.  Wash your hands before touching ANYTHING related to the catheter or bag.  Using mild soap and warm water on a washcloth:  Clean the area closest to the catheter insertion site using a circular motion around the catheter.  Clean the catheter itself by wiping AWAY from the insertion site for several inches down the tube.  NEVER wipe upward as this could sweep bacteria up into the urethra (tube in your body that normally drains the bladder) and cause infection.  Place a small amount of sterile lubricant at the tip of the penis where the catheter is entering.  Taking Care of the Drainage Bags Two drainage bags may be taken home: a large overnight drainage bag, and a smaller leg bag which fits underneath clothing.  It is okay to wear the overnight bag at any time, but NEVER wear the smaller leg bag at night.  Keep the drainage bag well below the level of your bladder. This prevents backflow of urine into the bladder and allows the urine to drain freely.  Anchor the tubing to  your leg to prevent pulling or tension on the catheter. Use tape or a leg strap provided by the hospital.  Empty the drainage bag when it is 1/2 to 3/4 full. Wash your hands before and after touching the bag.  Periodically check the tubing for kinks to make sure there is no pressure on the tubing which could restrict the flow of urine.  Changing the Drainage Bags Cleanse both ends of the clean bag with alcohol before changing.  Pinch off the rubber catheter to avoid urine spillage during the disconnection.  Disconnect the dirty bag and connect the clean one.  Empty the dirty bag carefully to avoid a urine spill.  Attach the new bag to the leg with tape or a leg strap.  Cleaning the Drainage Bags Whenever a drainage bag is disconnected, it must be cleaned quickly so it is ready for the next use.  Wash the bag in warm, soapy water.  Rinse the bag thoroughly with warm water.  Soak the bag for 30 minutes in a solution of white vinegar and water (1 cup vinegar to 1 quart warm water).  Rinse with warm water.  SEEK MEDICAL CARE IF:  You have chills or night sweats.  You are leaking around your catheter or have problems with your catheter. It is not uncommon to have sporadic leakage around your catheter as a result of bladder spasms. If the leakage stops, there is not much need for concern. If you are uncertain, call your caregiver.  You develop side effects that you think are coming from your medicines.  SEEK IMMEDIATE MEDICAL CARE IF:  You are suddenly unable to urinate. Check to see if there are any kinks in the drainage tubing that may cause this. If you cannot find any kinks, call your caregiver immediately. This is an emergency.  You develop shortness of breath or chest pains.  Bleeding persists or clots develop in your urine.  You have a fever.  You develop pain in your back or over your lower belly (abdomen).  You develop pain or swelling in your legs.  Any problems you are having get worse  rather than better.  MAKE SURE YOU:  Understand these instructions.  Will watch your condition.  Will get help right away if you are not doing well or get worse.

## 2021-12-07 NOTE — Progress Notes (Signed)
PT Cancellation Note  Patient Details Name: Kenneth Calhoun MRN: [id number] DOB: Jul 09, 1960   Cancelled Treatment:    Reason Eval/Treat Not Completed: Patient at procedure or test/unavailable. Attempted to see pt for PT treatment, however pt off unit for ORIF surgery. Will attempt again as able.    Marlana Salvage Imagene Gurney PT, DPT  12/07/2021, 11:10 AM

## 2021-12-07 NOTE — Transfer of Care (Signed)
Immediate Anesthesia Transfer of Care Note  Patient: Kenneth Calhoun  Procedure(s) Performed: OPEN REDUCTION INTERNAL FIXATION (ORIF) PILON FRACTURE (Left)  Patient Location: PACU  Anesthesia Type:General  Level of Consciousness: drowsy and patient cooperative  Airway & Oxygen Therapy: Patient Spontanous Breathing, Patient connected to face mask oxygen and nasal trumpet  Post-op Assessment: Report given to RN and Post -op Vital signs reviewed and stable  Post vital signs: Reviewed and stable  Last Vitals:  Vitals Value Taken Time  BP 128/67 12/07/21 1305  Temp 36.9 C 12/07/21 1305  Pulse 83 12/07/21 1311  Resp 12 12/07/21 1311  SpO2 91 % 12/07/21 1311  Vitals shown include unvalidated device data.  Last Pain:  Vitals:   12/07/21 1305  TempSrc:   PainSc: Asleep      Patients Stated Pain Goal: 0 (12/05/21 2345)  Complications: No notable events documented.

## 2021-12-07 NOTE — Progress Notes (Signed)
   Subjective:  Patient reports pain as moderate controlled with pain medication.  Denies any numbness in the left toes. NPO for possible OR today.  Objective:   VITALS:   Vitals:   12/06/21 1136 12/06/21 1555 12/06/21 2110 12/07/21 0500  BP: 125/66 (!) 131/59 (!) 144/75 (!) 147/67  Pulse: 84 87 89 95  Resp: 18 17 17 17   Temp:  98.7 F (37.1 C) 98.2 F (36.8 C)   TempSrc:  Oral Oral   SpO2: 92% 95% 93% 98%  Weight:      Height:        Ex-Fix sites are clean dry intact with pin site dressing.  Compartments are compressible.  There is some wrinkling about the ankle joint in terms of swelling.  He has sensation intact in the dorsal aspect of the foot in the DP and SP distributions.  Sensation intact in the tibial distribution.  He is generally able to fire extensor and flexor great toe.  2+ dorsalis pedis pulse.   Lab Results  Component Value Date   WBC 11.1 (H) 12/05/2021   HGB 16.0 12/05/2021   HCT 45.2 12/05/2021   MCV 84.3 12/05/2021   PLT 269 12/05/2021     Assessment/Plan:  2 Days Post-Op status post left open pilon fracture external fixation and irrigation and debridement.  Overall no concern for compartment syndrome at this time  - Expected postop acute blood loss anemia - will monitor for symptoms - Patient to work with PT to optimize mobilization safely - DVT ppx - SCDs, ambulation, hold lovenox today - NWB operative extremity - Pain control - multimodal pain management, ATC acetaminophen in conjunction with as needed narcotic (oxycodone), although this should be minimized with other modalities  -Appreciate urology consultation we will plan to keep Foley in until definitive fixation is performed given his issues with voiding.  Will defer to urology team for removal of this.   -Plan for skin check today with Dr. 12/07/2021 possible and possible definitive fixation if swelling is adequate   Kenneth Calhoun 12/07/2021, 5:25 AM

## 2021-12-07 NOTE — Progress Notes (Signed)
Report called to short stay. All undergarments removed from patient before transfer. Consent signed and in chart. Pre procedure checklist completed. CHG bath done this am. Patient transported to short stay via bed by transportation staff.

## 2021-12-07 NOTE — Anesthesia Procedure Notes (Signed)
Anesthesia Regional Block: Popliteal block   Pre-Anesthetic Checklist: , timeout performed,  Correct Patient, Correct Site, Correct Laterality,  Correct Procedure, Correct Position, site marked,  Risks and benefits discussed,  Surgical consent,  Pre-op evaluation,  At surgeon's request and post-op pain management  Laterality: Left  Prep: chloraprep       Needles:  Injection technique: Single-shot  Needle Type: Echogenic Stimulator Needle     Needle Length: 10cm  Needle Gauge: 20     Additional Needles:   Procedures:,,,, ultrasound used (permanent image in chart),,    Narrative:  Start time: 12/07/2021 9:39 AM End time: 12/07/2021 9:42 AM  Performed by: Personally  Anesthesiologist: Lucretia Kern, MD  Additional Notes: Standard monitors applied. Skin prepped. Good needle visualization with ultrasound. Injection made in 5cc increments with no resistance to injection. Patient tolerated the procedure well.

## 2021-12-08 MED ORDER — METHOCARBAMOL 500 MG PO TABS
500.0000 mg | ORAL_TABLET | Freq: Three times a day (TID) | ORAL | Status: DC | PRN
Start: 2021-12-08 — End: 2021-12-10
  Administered 2021-12-08 – 2021-12-09 (×3): 500 mg via ORAL
  Filled 2021-12-08 (×4): qty 1

## 2021-12-08 MED ORDER — ACETAMINOPHEN 500 MG PO TABS
1000.0000 mg | ORAL_TABLET | Freq: Three times a day (TID) | ORAL | Status: DC
  Administered 2021-12-08 – 2021-12-10 (×6): 1000 mg via ORAL
  Filled 2021-12-08 (×6): qty 2

## 2021-12-08 NOTE — Progress Notes (Signed)
     Subjective:  Patient reports pain as well controlled.  Worked well with physical therapy today.  Was only able to get to the bathroom and back.  Plan to work with therapy again tomorrow.  Hopefully discharge home tomorrow.  Objective:   VITALS:   Vitals:   12/07/21 1552 12/07/21 2027 12/08/21 0505 12/08/21 0836  BP: (!) 144/78 (!) 158/72 137/77 114/69  Pulse: 84 88 73 76  Resp: 16 18 17 17   Temp: 98.5 F (36.9 C) 98.2 F (36.8 C) 98.6 F (37 C) 98.4 F (36.9 C)  TempSrc: Oral Oral Oral Oral  SpO2: 97% 98% 100% 99%  Weight:      Height:        Sensation intact distally Intact pulses distally Dorsiflexion/Plantar flexion intact Incision: dressing C/D/I Compartment soft   Lab Results  Component Value Date   WBC 11.1 (H) 12/05/2021   HGB 16.0 12/05/2021   HCT 45.2 12/05/2021   MCV 84.3 12/05/2021   PLT 269 12/05/2021   BMET    Component Value Date/Time   NA 137 12/06/2021 0306   K 4.1 12/06/2021 0306   CL 106 12/06/2021 0306   CO2 23 12/06/2021 0306   GLUCOSE 163 (H) 12/06/2021 0306   BUN 18 12/06/2021 0306   CREATININE 1.51 (H) 12/06/2021 0306   CALCIUM 8.0 (L) 12/06/2021 0306   GFRNONAA 52 (L) 12/06/2021 0306   Assessment/Plan: 1 Day Post-Op   Active Problems:   Pilon fracture  Status post ORIF left pilon fracture with Dr. 12/08/2021 12/07/2021 Post op recs: WB: NWB LLE Abx: ceftriaxone Imaging: PACU xrays Dressing: keep intact until follow up, change PRN if soiled or saturated. DVT prophylaxis: lovenox   Sharla Tankard A Harutyun Monteverde 12/08/2021, 12:31 PM   12/10/2021, MD  Contact information:   [phone] 7am-5pm epic message Dr. HQIONGEX, or call office for patient follow up: [phone] After hours and holidays please check [email or web address] for group call information for [facility]

## 2021-12-08 NOTE — Progress Notes (Signed)
Physical Therapy Treatment Patient Details Name: Kenneth Calhoun MRN: [id number] DOB: 05-26-1960 Today's Date: 12/08/2021   History of Present Illness Pt is a 61 y/o M s/p L external fixation of pilon fracture possible ORIF after a 48ft fall off a ladder. Pt is now NWB LLE. PMH includes gastritis and appendectomy.    PT Comments    Pt received supine and eager for OOB mobility with pt stating desire to ambulate to BR. Pt able to demonstrate all bed mobility at supervision level for safety. Once EOB pt with some impulsivity attempting to come to stand before lines and this PTA ready. Cues needed throughout for safety awareness, to slow, and for increased awareness of deficits. Pt demonstrating ambulation with RW and good adherence to WB precautions with min assist to manage RW. Pt spouse stating pt wanting knee scooter, knee scooter brought to room and pt standing up to trial, however when bringing knee to pad pt stating discomfort and declining. Pt continues to benefit from skilled PT services to progress toward functional mobility goals.    Recommendations for follow up therapy are one component of a multi-disciplinary discharge planning process, led by the attending physician.  Recommendations may be updated based on patient status, additional functional criteria and insurance authorization.  Follow Up Recommendations  Home health PT     Assistance Recommended at Discharge Intermittent Supervision/Assistance  Patient can return home with the following A little help with walking and/or transfers;A little help with bathing/dressing/bathroom;Assist for transportation;Help with stairs or ramp for entrance   Equipment Recommendations  Rolling walker (2 wheels)    Recommendations for Other Services OT consult     Precautions / Restrictions Precautions Precautions: Fall Restrictions Weight Bearing Restrictions: Yes LLE Weight Bearing: Non weight bearing     Mobility  Bed  Mobility Overal bed mobility: Needs Assistance Bed Mobility: Supine to Sit, Sit to Supine     Supine to sit: Supervision Sit to supine: Supervision   General bed mobility comments: HOB elevated and use of bedrails Patient Response: Impulsive  Transfers Overall transfer level: Needs assistance Equipment used: Rolling walker (2 wheels) Transfers: Sit to/from Stand Sit to Stand: Min guard           General transfer comment: stood from elevated EOB and BSC over toilet with RW and min agurd for safety - cues for technique and hand placement and for safety awarness as pt with impulsivity to stand quickly    Ambulation/Gait Ambulation/Gait assistance: Min assist Gait Distance (Feet): 30 Feet Assistive device: Rolling walker (2 wheels) Gait Pattern/deviations:  (hop-to on RLE)       General Gait Details: hop to on RLE with good adherence to NWB, cues to slow as pt with quick unsafe gait.   Stairs Stairs: Yes (pt stating ramp at home was installed yesterday so stair negotiation is no longer needed)           Wheelchair Mobility    Modified Rankin (Stroke Patients Only)       Balance Overall balance assessment: Needs assistance Sitting-balance support: Bilateral upper extremity supported, Feet supported Sitting balance-Leahy Scale: Fair Sitting balance - Comments: maintained static sitting balance with distance suervision while holding LLE off floor   Standing balance support: Bilateral upper extremity supported, Reliant on assistive device for balance (RW) Standing balance-Leahy Scale: Fair Standing balance comment: pt able to maintain static standing balance with very close supervision.  Cognition Arousal/Alertness: Awake/alert Behavior During Therapy: WFL for tasks assessed/performed Overall Cognitive Status: Within Functional Limits for tasks assessed                                          Exercises  Other Exercises Other Exercises: pt declining further exercise and ambulation with no reason specified    General Comments General comments (skin integrity, edema, etc.): VSS on RA, increased time spent discussing safe mobility, slowing down and increasing awareness of deficits and environment with pt and spouse verbalizing understanding. Spouse hyperfixated on pt taking shower once home and need for shower stool.      Pertinent Vitals/Pain Pain Assessment Pain Assessment: Faces Faces Pain Scale: Hurts a little bit Pain Location: LLE Pain Descriptors / Indicators: Discomfort, Grimacing, Guarding, Heaviness, Sore, Squeezing, Throbbing Pain Intervention(s): Monitored during session, Limited activity within patient's tolerance    Home Living                          Prior Function            PT Goals (current goals can now be found in the care plan section) Acute Rehab PT Goals Patient Stated Goal: to go tot he bathrrom PT Goal Formulation: With patient/family Time For Goal Achievement: 12/13/21    Frequency    Min 5X/week      PT Plan      Co-evaluation              AM-PAC PT "6 Clicks" Mobility   Outcome Measure  Help needed turning from your back to your side while in a flat bed without using bedrails?: A Little Help needed moving from lying on your back to sitting on the side of a flat bed without using bedrails?: A Little Help needed moving to and from a bed to a chair (including a wheelchair)?: A Little Help needed standing up from a chair using your arms (e.g., wheelchair or bedside chair)?: A Little Help needed to walk in hospital room?: A Little Help needed climbing 3-5 steps with a railing? : A Lot 6 Click Score: 17    End of Session Equipment Utilized During Treatment: Gait belt Activity Tolerance: Patient tolerated treatment well Patient left: in bed;with call bell/phone within reach;with bed alarm set;with family/visitor present Nurse  Communication: Mobility status PT Visit Diagnosis: Unsteadiness on feet (R26.81);Other abnormalities of gait and mobility (R26.89);Muscle weakness (generalized) (M62.81);Pain Pain - Right/Left: Left Pain - part of body: Leg;Ankle and joints of foot     Time: 2376-2831 PT Time Calculation (min) (ACUTE ONLY): 30 min  Charges:  $Gait Training: 8-22 mins $Therapeutic Activity: 8-22 mins                     Dak Szumski R. PTA Acute Rehabilitation Services Office: [phone]     Catalina Antigua 12/08/2021, 10:06 AM

## 2021-12-09 MED ORDER — METHOCARBAMOL 500 MG PO TABS: 500.0000 mg | ORAL_TABLET | Freq: Three times a day (TID) | ORAL | 0 refills | Status: DC | PRN

## 2021-12-09 MED ORDER — ACETAMINOPHEN 500 MG PO TABS: 1000.0000 mg | ORAL_TABLET | Freq: Three times a day (TID) | ORAL | 0 refills | Status: DC

## 2021-12-09 MED ORDER — ASPIRIN 325 MG PO TBEC: 325.0000 mg | DELAYED_RELEASE_TABLET | Freq: Every day | ORAL | 0 refills | Status: DC

## 2021-12-09 MED ORDER — ONDANSETRON HCL 4 MG PO TABS: 4.0000 mg | ORAL_TABLET | Freq: Four times a day (QID) | ORAL | 0 refills | Status: DC | PRN

## 2021-12-09 MED ORDER — OXYCODONE HCL 5 MG PO TABS
5.0000 mg | ORAL_TABLET | Freq: Four times a day (QID) | ORAL | 0 refills | Status: DC | PRN
Start: 2021-12-09 — End: 2022-01-24

## 2021-12-09 NOTE — Progress Notes (Signed)
Physical Therapy Treatment & Discharge  Patient Details Name: Kenneth Calhoun MRN: [id number] DOB: 08/17/1960 Today's Date: 12/09/2021   History of Present Illness Pt is a 61 y/o M s/p L external fixation of pilon fracture possible ORIF after a 47f fall off a ladder. Pt is now NWB LLE. PMH includes gastritis and appendectomy.    PT Comments    Pt demonstrating increased independence with mobility this session. Requiring supervision for transfers and ambulation of 641fwith RW. Demonstrated good adherence to NWB throughout session. Pt with no further questions/concerns about discharge home. PT will sign off at this time due to pts level of independence. Pt may benefit from OPPT once able to weightbear through LLE.    Recommendations for follow up therapy are one component of a multi-disciplinary discharge planning process, led by the attending physician.  Recommendations may be updated based on patient status, additional functional criteria and insurance authorization.  Follow Up Recommendations  No PT follow up (May benefit from OPPT once able to weightbear)     Assistance Recommended at Discharge Intermittent Supervision/Assistance  Patient can return home with the following A little help with walking and/or transfers;A little help with bathing/dressing/bathroom;Assist for transportation;Help with stairs or ramp for entrance   Equipment Recommendations  Rolling walker (2 wheels)    Recommendations for Other Services       Precautions / Restrictions Precautions Precautions: Fall Restrictions Weight Bearing Restrictions: Yes LLE Weight Bearing: Non weight bearing     Mobility  Bed Mobility Overal bed mobility: Modified Independent             General bed mobility comments: HOB elevated and use of bed rail    Transfers Overall transfer level: Needs assistance Equipment used: Rolling walker (2 wheels) Transfers: Sit to/from Stand Sit to Stand: Supervision            General transfer comment: supervision for safety from elevated surface    Ambulation/Gait Ambulation/Gait assistance: Supervision Gait Distance (Feet): 60 Feet Assistive device: Rolling walker (2 wheels) Gait Pattern/deviations:  (hop to) Gait velocity: decreased Gait velocity interpretation: <1.8 ft/sec, indicate of risk for recurrent falls   General Gait Details: good adherence to NWB, demonstrated safe gait speed   Stairs             Wheelchair Mobility    Modified Rankin (Stroke Patients Only)       Balance Overall balance assessment: Needs assistance Sitting-balance support: No upper extremity supported, Feet supported Sitting balance-Leahy Scale: Good     Standing balance support: Bilateral upper extremity supported, During functional activity Standing balance-Leahy Scale: Fair Standing balance comment: able to remove UE support for brief periods and maintain balance                            Cognition Arousal/Alertness: Awake/alert Behavior During Therapy: WFL for tasks assessed/performed Overall Cognitive Status: Within Functional Limits for tasks assessed                                          Exercises      General Comments        Pertinent Vitals/Pain Pain Assessment Pain Assessment: Faces Faces Pain Scale: Hurts a little bit Pain Location: LLE Pain Descriptors / Indicators: Discomfort Pain Intervention(s): Limited activity within patient's tolerance, Monitored during session    Home  Living                          Prior Function            PT Goals (current goals can now be found in the care plan section) Acute Rehab PT Goals Patient Stated Goal: to go home PT Goal Formulation: All assessment and education complete, DC therapy Progress towards PT goals: Goals met/education completed, patient discharged from PT    Frequency    Min 5X/week      PT Plan Current plan remains  appropriate    Co-evaluation              AM-PAC PT "6 Clicks" Mobility   Outcome Measure  Help needed turning from your back to your side while in a flat bed without using bedrails?: None Help needed moving from lying on your back to sitting on the side of a flat bed without using bedrails?: None Help needed moving to and from a bed to a chair (including a wheelchair)?: A Little Help needed standing up from a chair using your arms (e.g., wheelchair or bedside chair)?: A Little Help needed to walk in hospital room?: A Little Help needed climbing 3-5 steps with a railing? : A Lot 6 Click Score: 19    End of Session   Activity Tolerance: Patient tolerated treatment well Patient left: in chair;with call bell/phone within reach;with chair alarm set Nurse Communication: Mobility status PT Visit Diagnosis: Unsteadiness on feet (R26.81);Other abnormalities of gait and mobility (R26.89);Muscle weakness (generalized) (M62.81);Pain Pain - Right/Left: Left Pain - part of body: Leg;Ankle and joints of foot     Time: 4098-1191 PT Time Calculation (min) (ACUTE ONLY): 16 min  Charges:  $Therapeutic Activity: 8-22 mins                    Mackie Pai, SPT Acute Rehabilitation Services  Office: [phone]    Mackie Pai 12/09/2021, 9:13 AM

## 2021-12-09 NOTE — Progress Notes (Addendum)
Subjective: Patient reports pain as moderate. Nerve block wearing off so pain increasing.  Tolerating diet.  Urinating.  No CP, SOB. Worked with PT on mobilizing OOB. Wants to go home today.   Objective:   VITALS:   Vitals:   12/08/21 0836 12/08/21 1514 12/08/21 2001 12/09/21 0435  BP: 114/69 124/76 (!) 144/71 (!) 166/80  Pulse: 76 85 80 84  Resp: 17 17 18 18   Temp: 98.4 F (36.9 C) 98.9 F (37.2 C) 98.4 F (36.9 C) 99.3 F (37.4 C)  TempSrc: Oral Oral Oral Oral  SpO2: 99% 98% 96% 94%  Weight:      Height:          Latest Ref Rng & Units 12/05/2021    2:39 PM 06/11/2018    7:01 PM  CBC  WBC 4.0 - 10.5 K/uL 11.1  11.6   Hemoglobin 13.0 - 17.0 g/dL 06/13/2018  33.2   Hematocrit 39.0 - 52.0 % 45.2  42.6   Platelets 150 - 400 K/uL 269  251       Latest Ref Rng & Units 12/06/2021    3:06 AM 12/05/2021    2:39 PM 06/11/2018    7:01 PM  BMP  Glucose 70 - 99 mg/dL 06/13/2018  884  80   BUN 8 - 23 mg/dL 18  15  16    Creatinine 0.61 - 1.24 mg/dL 166   0.63   Sodium 135 - 145 mmol/L 137  139  137   Potassium 3.5 - 5.1 mmol/L 4.1  3.8  4.0   Chloride 98 - 111 mmol/L 106  107  103   CO2 22 - 32 mmol/L 23  23  27    Calcium 8.9 - 10.3 mg/dL 8.0  8.5  8.9    Intake/Output      07/29 0701 07/30 0700 07/30 0701 07/31 0700   I.V. (mL/kg)     Total Intake(mL/kg)     Urine (mL/kg/hr) 900 (0.4)    Blood     Total Output 900    Net -900            Physical Exam: General: NAD. Sleeping in bed, easily awoken, calm Resp: No increased wob Cardio: regular rate and rhythm ABD soft Neurologically intact MSK Neurovascularly intact Sensation intact distally Intact pulses distally Can move toes Incision: dressing C/D/I Splint intact  Assessment: 2 Days Post-Op  S/P Procedure(s) (LRB): OPEN REDUCTION INTERNAL FIXATION (ORIF) PILON FRACTURE (Left) by Dr. 8/30 on 12/07/21  Active Problems:   Pilon fracture   Plan: Control pain  Advance diet Up with therapy Incentive  Spirometry Elevate and Apply ice  Weightbearing: NWB LLE Insicional and dressing care: Dressings left intact until follow-up and Reinforce dressings as needed Orthopedic device(s): Splint Showering: Keep dressing dry VTE prophylaxis: Lovenox 40mg  qd  while inpatient , SCDs, ambulation Pain control: Tylenol, Oxy, Dilaudid Follow - up plan:  with Dr. 8/31 information:  Jena Gauss MD, 12/09/21 PA-C  Dispo: Home later today after working once more with PT     , PA-C Office [phone] 12/09/2021, 7:25 AM        Addendum 12/09/21 1300  I ordered a rolling walker and placed discharge orders for the patient earlier today. Discharge meds were sent to patient's pharmacy. He was cleared by PT/OT. I was notified a short time ago by his RN that the patient is very worried about going home and his pain management. He has needed Oxy  15mg , Oxy 10mg , and Dilaudid 1mg  around the clock while inpatient.  He felt the Oxy 5-10mg  q6h that I sent was not going to be sufficient for controlling his pain. I explained to him this morning that we could not guarantee a pain free recovery and that the goal was to find a tolerable level.   He appears to not handle pain well and is repeatedly talking about not wanting to go home without the same dose and frequency of the medicines he is getting inpatient. When I tried to call his listed pharmacy, [facility], to change his narcotic script a recording said they are closed on Sundays so he would not even be able to pick up any medicine if he left today.   I decided to cancel the discharge order for today and let his attending, Dr. , determine the best course of action tomorrow.   I also was notified by his RN that he still has a foley catheter in place that was put in by urology due to pre-op urinary retention and notes said to continue it until told otherwise by urology. I spoke to the on call provider Dr. and he said  to keep it in place, and the patient will likely be sent home with it and f/u outpatient at their office for a voiding trial this week.

## 2021-12-09 NOTE — TOC Initial Note (Signed)
Transition of Care [facility]) - Initial/Assessment Note    Patient Details  Name: Kenneth Calhoun MRN: [id number] Date of Birth: November 15, 1960  [facility]) CM/SW Contact:    Bess Kinds, RN Phone Number: [phone] 12/09/2021, 1:35 PM  Clinical Narrative:                  Attempt to contact patient on mobile number, generic voicemail left with contact information. Spoke with spouse, Selena Batten, on hospital room phone. Discussed post acute transition.   Patient's hospital stay is to be covered by workman's comp. RNCM requested that Selena Batten provide RNCM  contact number to case worker in order to help coordinate care  needs.   Patient will need RW and 3N1. DME orders in place.   Family to provide transportation home at discharge.   DC medications have bee sent to patient's preferred choice, including pain medication. Pharmacy is not open on Sundays. Patient is continuing to have difficulty with breakthrough pain.   TOC following for transition needs.   Expected Discharge Plan: Home/Self Care Barriers to Discharge: Continued Medical Work up   Patient Goals and CMS Choice Patient states their goals for this hospitalization and ongoing recovery are:: home with wife CMS Medicare.gov Compare Post Acute Care list provided to:: Patient Choice offered to / list presented to : Patient, Spouse  Expected Discharge Plan and Services Expected Discharge Plan: Home/Self Care   Discharge Planning Services: CM Consult Post Acute Care Choice: Durable Medical Equipment   Expected Discharge Date: 12/09/21                         HH Arranged: NA HH Agency: NA        Prior Living Arrangements/Services   Lives with:: Self, Spouse Patient language and need for interpreter reviewed:: Yes        Need for Family Participation in Patient Care: Yes (Comment) Care giver support system in place?: Yes (comment)   Criminal Activity/Legal Involvement Pertinent to Current Situation/Hospitalization:  No - Comment as needed  Activities of Daily Living Home Assistive Devices/Equipment: None ADL Screening (condition at time of admission) Patient's cognitive ability adequate to safely complete daily activities?: Yes Is the patient deaf or have difficulty hearing?: No Does the patient have difficulty seeing, even when wearing glasses/contacts?: No Does the patient have difficulty concentrating, remembering, or making decisions?: No Patient able to express need for assistance with ADLs?: Yes Does the patient have difficulty dressing or bathing?: No Independently performs ADLs?: No Communication: Independent Dressing (OT): Needs assistance Is this a change from baseline?: Change from baseline, expected to last <3days Grooming: Needs assistance Is this a change from baseline?: Change from baseline, expected to last <3 days Feeding: Independent Bathing: Needs assistance Is this a change from baseline?: Change from baseline, expected to last <3 days Toileting: Needs assistance Is this a change from baseline?: Change from baseline, expected to last <3 days In/Out Bed: Needs assistance Is this a change from baseline?: Change from baseline, expected to last <3 days Walks in Home: Independent Does the patient have difficulty walking or climbing stairs?: No Weakness of Legs: Left Weakness of Arms/Hands: None  Permission Sought/Granted            Permission granted to share info w Relationship: wife     Emotional Assessment       Orientation: : Oriented to Self, Oriented to Place, Oriented to  Time, Oriented to Situation Alcohol / Substance  Use: Not Applicable Psych Involvement: No (comment)  Admission diagnosis:  Type III open fracture of distal end of left fibula, unspecified fracture morphology, initial encounter [S82.832C] Type III open fracture of distal end of left tibia, unspecified fracture morphology, initial encounter [S82.302C] Postop check [Z09] Pilon fracture  [I01.655V] Patient Active Problem List   Diagnosis Date Noted   Pilon fracture 12/05/2021   PCP:  Patient, No Pcp Per Pharmacy:   [facility] - [place], [place] - [address] [place] [address] Phone: [phone] Fax: [phone]     Social Determinants of Health (SDOH) Interventions    Readmission Risk Interventions     No data to display

## 2021-12-10 ENCOUNTER — Encounter (HOSPITAL_COMMUNITY): Payer: Self-pay | Admitting: Student

## 2021-12-10 DIAGNOSIS — N35919 Unspecified urethral stricture, male, unspecified site: Secondary | ICD-10-CM

## 2021-12-10 DIAGNOSIS — R338 Other retention of urine: Secondary | ICD-10-CM | POA: Diagnosis present

## 2021-12-10 HISTORY — DX: Unspecified urethral stricture, male, unspecified site: N35.919

## 2021-12-10 NOTE — Plan of Care (Signed)
  Problem: Pain Managment: Goal: General experience of comfort will improve Outcome: Progressing   

## 2021-12-10 NOTE — Discharge Summary (Signed)
Orthopaedic Trauma Service (OTS) Discharge Summary   Patient ID: Kenneth Calhoun MRN: [id number] DOB/AGE: Sep 04, 1960 61 y.o.  Admit date: 12/05/2021 Discharge date: 12/10/2021  Admission Diagnoses: Open left pilon fracture Urethral meatal stenosis Acute urinary retention  Discharge Diagnoses:  Principal Problem:   Open displaced pilon fracture of left tibia Active Problems:   Urethral meatal stenosis   Acute urinary retention   Past Medical History:  Diagnosis Date   Gastritis    Urethral meatal stenosis 12/10/2021     Procedures Performed: 12/05/2021- Dr. Steward Drone 1.  Irrigation debridement left open fracture of the distal tibia 2.  Placement of multiplanar external fixator for staged fixation  12/07/2021- Dr. Jena Gauss CPT [id number] reduction internal fixation of left pilon fracture CPT 27758-Open reduction internal fixation of left tibial shaft fracture CPT 11012-Irrigation and debridement of left open tibia fracture CPT 20694-Removal of external fixation from left ankle   Discharged Condition: good  Hospital Course:   Patient is a 61 year old male who sustained a fall approximately 12 feet off of a ladder.  He owns a heating and cooling company and was up on a ladder doing a job when he sustained a fall resulting isolated orthopedic injury including an open left pilon fracture.  Patient was seen and evaluated by Dr. Steward Drone in the emergency department on arrival he was taken emergently to the operating room for irrigation debridement as well as placement of an external fixator.  Due to the complexity of the injury orthopedic trauma service was consulted for definitive management.  Patient was seen and evaluated by Dr. Jena Gauss and taken to the operating room for definitive fixation on 12/07/2021 as his soft tissue envelope was amenable to it.  Patient tolerated this procedure well.  He was covered with appropriate antibiotics for his open fracture (Rocephin).  He  progressed well over the next several days with therapies.  Pain was well controlled.  His block did wear off on postoperative day #1 following his second procedure.  He would have discharged home on 12/09/2021 however his pharmacy was not open on Sunday and there would be no way for him to obtain his pain medications.  Patient was then discharged on 12/10/2021 in stable condition.  Following his initial surgical procedure he did have some acute urinary retention.  Patient was seen and evaluated by urology who placed a Foley catheter.  This catheter is going to remain in place at discharge and he will follow-up with urology this Wednesday for removal  He was covered with Lovenox during his inpatient stay and will be discharged on aspirin 325 mg daily for further DVT and PE prophylaxis.  He remains at an increased risk for development of nonunion and deep infection given the open nature of his injury however he did undergo serial thorough irrigation debridements as well as appropriate antibiotic coverage.  Patient will be nonweightbearing for the next 6 to 8 weeks.  He will remain in the splint for the next 2 weeks and will follow-up in the office in 2 weeks for splint removal and likely placement into a cam boot to begin range of motion exercises.  We did discuss possibility of getting in a pool around the 4 to 5-week mark once his wounds are completely healed as well as starting an exercise bike on 0 resistance at about 3 weeks or so and then incorporating some home range of motion exercises at that time as well.  He would likely benefit from outpatient physical therapy once he is  weightbearing.   Consults: Urology  Significant Diagnostic Studies: labs:    Latest Reference Range & Units 12/05/21 14:39 12/06/21 03:06  Sodium 135 - 145 mmol/L 139 137  Potassium 3.5 - 5.1 mmol/L 3.8 4.1  Chloride 98 - 111 mmol/L 107 106  CO2 22 - 32 mmol/L 23 23  Glucose 70 - 99 mg/dL 951 (H) 884 (H)  BUN 8 - 23 mg/dL  15 18  Creatinine 1.66 - 1.24 mg/dL 0.63 (H) 0.16 (H)  Calcium 8.9 - 10.3 mg/dL 8.5 (L) 8.0 (L)  Anion gap 5 - 15  9 8   GFR, Estimated >60 mL/min 47 (L) 52 (L)  WBC 4.0 - 10.5 K/uL 11.1 (H)   RBC 4.22 - 5.81 MIL/uL 5.36   Hemoglobin 13.0 - 17.0 g/dL   HCT 01.0 - 93.2 % 45.2   MCV 80.0 - 100.0 fL 84.3   MCH 26.0 - 34.0 pg 29.9   MCHC 30.0 - 36.0 g/dL 35.5   RDW 73.2 - 20.2 % 12.6   Platelets 150 - 400 K/uL 269   nRBC 0.0 - 0.2 % 0.0   Neutrophils % 67   Lymphocytes % 22   Monocytes Relative % 7   Eosinophil % 2   Basophil % 1   Immature Granulocytes % 1   NEUT# 1.7 - 7.7 K/uL 7.7   Lymphocyte # 0.7 - 4.0 K/uL 2.4   Monocyte # 0.1 - 1.0 K/uL 0.8   Eosinophils Absolute 0.0 - 0.5 K/uL 0.2   Basophils Absolute 0.0 - 0.1 K/uL 0.1   Abs Immature Granulocytes 0.00 - 0.07 K/uL 0.10 (H)   (H): Data is abnormally high (L): Data is abnormally low  Treatments: IV hydration, antibiotics: Ancef and ceftriaxone, analgesia: acetaminophen, oxycodone and Dilaudid, anticoagulation: Lovenox limitation and aspirin at discharge, therapies: PT, OT, and RN, and surgery: As above  Discharge Exam:    Orthopaedic Trauma Service Progress Note   Patient ID: Kenneth Calhoun MRN: [name] DOB/AGE: 1960-08-17 61 y.o.   Subjective:   Doing well No complaints Ready to go home today    Wants to shower before dc    Foley remains in.  Follow up appointment with urology this Wednesday    ROS As above   Objective:    VITALS:         Vitals:    12/09/21 1705 12/09/21 2039 12/10/21 0445 12/10/21 0757  BP:   (!) 153/68 (!) 176/83 (!) 153/80  Pulse:   86 85 79  Resp:   18 18 16   Temp: 98.5 F (36.9 C) 98.4 F (36.9 C)   98.1 F (36.7 C)  TempSrc: Oral Oral   Oral  SpO2:   96% 98% 98%  Weight:          Height:              Estimated body mass index is 30.7 kg/m as calculated from the following:   Height as of this encounter: 5' 10.5" (1.791 m).   Weight as of this encounter: 98.4  kg.     Intake/Output      07/30 0701 07/31 0700 07/31 0701 08/01 0700   P.O.     Total Intake(mL/kg)     Urine (mL/kg/hr) 3810 (1.6)    Total Output 3810    Net -3810            LABS   Lab Results Last 24 Hours  No results found for this or any previous visit (from  the past 24 hour(s)).       PHYSICAL EXAM:    Gen: sitting up in chair, NAD, appears well Lungs: unlabored Cardiac: RRR Ext:       Left Lower Extremity              Splint dry and intact             Ext warm              Good perfusion distally              DPN, SPN, TN sensation grossly intact             No pain out of proportion with stretching of his toes              + DP pulse             EHL, FHL, lesser toe motor intact                  Assessment/Plan: 3 Days Post-Op    Principal Problem:   Open displaced pilon fracture of left tibia Active Problems:   Urethral meatal stenosis   Acute urinary retention     Anti-infectives (From admission, onward)        Start     Dose/Rate Route Frequency Ordered Stop    12/07/21 1117   vancomycin (VANCOCIN) powder  Status:  Discontinued            As needed 12/07/21 1118 12/07/21 1300    12/06/21 1100   cefTRIAXone (ROCEPHIN) 2 g in sodium chloride 0.9 % 100 mL IVPB        2 g 200 mL/hr over 30 Minutes Intravenous Every 24 hours 12/06/21 1003 12/08/21 1211    12/05/21 1913   ceFAZolin (ANCEF) 2-4 GM/100ML-% IVPB       Note to Pharmacy: [place] L: cabinet override         12/05/21 1913 12/06/21 0729         .   POD/HD#: 54   61 year old male fall off ladder with open left pilon fracture s/p external fixation followed by ORIF   -Fall off ladder   -Open Left pilon fracture s/p ORIF             Nonweightbearing left leg             Keep splint in place.  Do not remove.             Splint will be removed at follow-up appointment             Continue with ice and elevation for swelling and pain control             Unrestricted hip and  knee range of motion             Mobilize with walker or crutches     - Pain management:             Multimodal   - ABL anemia/Hemodynamics             Stable   - Medical issues              Meatal stenosis/urinary retention---> discharged with Foley, follow-up with urology on Wednesday   - DVT/PE prophylaxis:             ASA 325 mg daily  - ID:  Antibiotics completed for open fracture   - Activity:             As above  - FEN/GI prophylaxis/Foley/Lines:             Regular diet   Foley catheter to remain in place until follow-up with urology on Wednesday   -Ex-fix/Splint care:             Keep splint clean and dry              Do not remove splint   - Impediments to fracture healing:             Open fracture    - Dispo:             Discharge today              Follow up with ortho in 10-14 days    Disposition: Discharge disposition: 01-Home or Self Care       Discharge Instructions     Call MD / Call 911   Complete by: As directed    If you experience chest pain or shortness of breath, CALL 911 and be transported to the hospital emergency room.  If you develope a fever above 101 F, pus (white drainage) or increased drainage or redness at the wound, or calf pain, call your surgeon's office.   Diet - low sodium heart healthy   Complete by: As directed    Discharge instructions   Complete by: As directed    It is very important for you to Elevate your leg - Toes above nose as much as possible to reduce pain / swelling.    Weight Bearing:  Non weight bearing affected leg.  Diet: As you were doing prior to hospitalization   Shower:  You have a splint on, leave the splint in place and keep the splint dry with a plastic bag.  Dressing:  You have a splint. Leave the splint in place and we will change your bandages during your first follow-up appointment.  You may loosen and re-apply ace wrap if it feels too tight.    Activity:  Increase activity  slowly as tolerated, but follow the weight bearing instructions above.  The rules on driving is that you can not be taking narcotics while you drive, and you must feel in control of the vehicle.    Medicines:  - Tylenol is for mild to moderate pain relief. - Oxycodone is a narcotic for severe pain relief. Take this as little as possible and stop it as soon as possible. - Robaxin is for muscle spasms. This medicine can make you drowsy. - Zofran is for nausea and vomiting. - Aspirin is for prevent blood clots after surgery. YOU MUST TAKE THIS MEDICINE!!  To prevent constipation:  Narcotic medicines cause constipation.  Wean these as soon as is appropriate.   You may use a stool softener such as -  Colace (over the counter) 100 mg by mouth twice a day  Drink plenty of fluids (prune juice may be helpful) and high fiber foods Miralax (over the counter) for constipation as needed.    Itching:  If you experience itching with your medications, try taking only a single pain pill, or even half a pain pill at a time.  You can also use benadryl over the counter for itching or also to help with sleep.   Precautions:  If you experience chest pain or shortness of breath - call 911  immediately for transfer to the hospital emergency department!!   Driving restrictions   Complete by: As directed    No driving for 2-4 weeks   Non weight bearing   Complete by: As directed    Post-operative opioid taper instructions:   Complete by: As directed    POST-OPERATIVE OPIOID TAPER INSTRUCTIONS: It is important to wean off of your opioid medication as soon as possible. If you do not need pain medication after your surgery it is ok to stop day one. Opioids include: Codeine, Hydrocodone(Norco, Vicodin), Oxycodone(Percocet, oxycontin) and hydromorphone amongst others.  Long term and even short term use of opiods can cause: Increased pain response Dependence Constipation Depression Respiratory depression And  more.  Withdrawal symptoms can include Flu like symptoms Nausea, vomiting And more Techniques to manage these symptoms Hydrate well Eat regular healthy meals Stay active Use relaxation techniques(deep breathing, meditating, yoga) Do Not substitute Alcohol to help with tapering If you have been on opioids for less than two weeks and do not have pain than it is ok to stop all together.  Plan to wean off of opioids This plan should start within one week post op of your joint replacement. Maintain the same interval or time between taking each dose and first decrease the dose.  Cut the total daily intake of opioids by one tablet each day Next start to increase the time between doses. The last dose that should be eliminated is the evening dose.         Allergies as of 12/10/2021   No Known Allergies      Medication List     TAKE these medications    acetaminophen 500 MG tablet Commonly known as: TYLENOL Take 2 tablets (1,000 mg total) by mouth every 8 (eight) hours.   aspirin EC 325 MG tablet Take 1 tablet (325 mg total) by mouth daily. to prevent blood clots after surgery   diphenhydramine-acetaminophen 25-500 MG Tabs tablet Commonly known as: TYLENOL PM Take 1 tablet by mouth at bedtime as needed (sleep).   methocarbamol 500 MG tablet Commonly known as: ROBAXIN Take 1 tablet (500 mg total) by mouth every 8 (eight) hours as needed for muscle spasms.   ondansetron 4 MG tablet Commonly known as: ZOFRAN Take 1 tablet (4 mg total) by mouth every 6 (six) hours as needed for nausea.   oxyCODONE 5 MG immediate release tablet Commonly known as: Oxy IR/ROXICODONE Take 1-2 tablets (5-10 mg total) by mouth every 6 (six) hours as needed for severe pain.               Durable Medical Equipment  (From admission, onward)           Start     Ordered   12/10/21 1036  For home use only DME 3 n 1  Once        12/10/21 1036   12/09/21 1158  For home use only DME Walker  rolling  Once       Question Answer Comment  Walker: With 5 Inch Wheels   Patient needs a walker to treat with the following condition Closed displaced trimalleolar fracture of left ankle      12/09/21 1158              Discharge Care Instructions  (From admission, onward)           Start     Ordered   12/09/21 0000  Non weight bearing  12/09/21 1106            Follow-up Information     Haddix, Gillie MannersKevin P, MD Follow up in 2 week(s).   Specialty: Orthopedic Surgery Contact information: [address] [place] [address] [phone]         Alfredo MartinezMacDiarmid, Scott, MD. Schedule an appointment as soon as possible for a visit on 12/12/2021.   Specialty: Urology Why: Catheter removal Contact information: [address] [place] [address] [phone]                 Discharge Instructions and Plan:  61 year old male fall off ladder with open left pilon fracture and acute urinary retention with urethral meatal stenosis  Weightbearing: NWB LLE Insicional and dressing care: Dressings left intact until follow-up Orthopedic device(s): Splint and walker Showering: Okay to shower but keep splint clean and dry.  Recommend using cast cover or garbage bag VTE prophylaxis: Aspirin 325 mg daily Pain control: Tylenol, oxycodone, Robaxin Follow - up plan: 2 weeks Contact information: Truitt MerleKevin Haddix MD, Montez MoritaKeith Chrystian Ressler PA-C   Signed:  Mearl LatinKeith W. Kerstin Crusoe, PA-C [phone] (C) 12/10/2021, 11:56 AM  [facility] [address] [place] [address] [phone] Collier Bullock(O) [phone] (F)

## 2021-12-10 NOTE — Progress Notes (Signed)
Patient given discharge instructions and stated understanding. 

## 2021-12-10 NOTE — Progress Notes (Addendum)
Orthopaedic Trauma Service Progress Note  Patient ID: Kenneth Calhoun MRN: [id number] DOB/AGE: Jun 02, 1960 61 y.o.  Subjective:  Doing well No complaints Ready to go home today   Wants to shower before dc   Foley remains in.  Follow up appointment with urology this Wednesday   ROS As above  Objective:   VITALS:   Vitals:   12/09/21 1705 12/09/21 2039 12/10/21 0445 12/10/21 0757  BP:  (!) 153/68 (!) 176/83 (!) 153/80  Pulse:  86 85 79  Resp:  18 18 16   Temp: 98.5 F (36.9 C) 98.4 F (36.9 C)  98.1 F (36.7 C)  TempSrc: Oral Oral  Oral  SpO2:  96% 98% 98%  Weight:      Height:        Estimated body mass index is 30.7 kg/m as calculated from the following:   Height as of this encounter: 5' 10.5" (1.791 m).   Weight as of this encounter: 98.4 kg.   Intake/Output      07/30 0701 07/31 0700 07/31 0701 08/01 0700   P.O.     Total Intake(mL/kg)     Urine (mL/kg/hr) 3810 (1.6)    Total Output 3810    Net -3810           LABS  No results found for this or any previous visit (from the past 24 hour(s)).   PHYSICAL EXAM:   Gen: sitting up in chair, NAD, appears well Lungs: unlabored Cardiac: RRR Ext:       Left Lower Extremity   Splint dry and intact  Ext warm   Good perfusion distally   DPN, SPN, TN sensation grossly intact  No pain out of proportion with stretching of his toes   + DP pulse  EHL, FHL, lesser toe motor intact     Assessment/Plan: 3 Days Post-Op   Principal Problem:   Open displaced pilon fracture of left tibia Active Problems:   Urethral meatal stenosis   Acute urinary retention   Anti-infectives (From admission, onward)    Start     Dose/Rate Route Frequency Ordered Stop   12/07/21 1117  vancomycin (VANCOCIN) powder  Status:  Discontinued          As needed 12/07/21 1118 12/07/21 1300   12/06/21 1100  cefTRIAXone (ROCEPHIN) 2 g in sodium chloride 0.9  % 100 mL IVPB        2 g 200 mL/hr over 30 Minutes Intravenous Every 24 hours 12/06/21 1003 12/08/21 1211   12/05/21 1913  ceFAZolin (ANCEF) 2-4 GM/100ML-% IVPB       Note to Pharmacy: 12/07/21 L: cabinet override      12/05/21 1913 12/06/21 0729     .  POD/HD#: 56  61 year old male fall off ladder with open left pilon fracture s/p external fixation followed by ORIF  -Fall off ladder  -Open Left pilon fracture s/p ORIF  Nonweightbearing left leg  Keep splint in place.  Do not remove.  Splint will be removed at follow-up appointment  Continue with ice and elevation for swelling and pain control  Unrestricted hip and knee range of motion  Mobilize with walker or crutches   - Pain management:  Multimodal  - ABL anemia/Hemodynamics  Stable  - Medical issues   Meatal stenosis/urinary retention---> discharged with Foley,  follow-up with urology on Wednesday  - DVT/PE prophylaxis:  ASA 325 mg daily  - ID:   Antibiotics completed for open fracture  - Activity:  As above - FEN/GI prophylaxis/Foley/Lines:  Regular diet  -Ex-fix/Splint care:  Keep splint clean and dry   Do not remove splint  - Impediments to fracture healing:  Open fracture   - Dispo:  Discharge today   Follow up with ortho in 10-14 days   Mearl Latin, PA-C [phone] (C) 12/10/2021, 11:45 AM  [facility] [address] [place] [address] [phone] Val [phone] (F)    After 5pm and on the weekends please log on to Amion, go to orthopaedics and the look under the [facility] Call for the provider(s) on call. You can also call our office at [phone] and then follow the prompts to be connected to the call team.   Patient ID: Kenneth Calhoun, male   DOB: 1961-04-20, 61 y.o.   MRN: [id number]

## 2021-12-10 NOTE — Plan of Care (Signed)

## 2021-12-10 NOTE — TOC Initial Note (Addendum)
Transition of Care [facility]) - Initial/Assessment Note    Patient Details  Name: Kenneth Calhoun MRN: [id number] Date of Birth: 11/05/1960  Transition of Care [facility]) CM/SW Contact:    Kingsley Plan, RN Phone Number: 12/10/2021, 10:55 AM  Clinical Narrative:                 Spoke to patient and wife at bedside. Workers Comp information : Environmental education officer at [employer] [phone]. In morning meeting reported will need foley to remain in. PT recommending walker. Patient and wife were told by PT they also recommend 3 in 1 . NCM messaged PA for order for 3 in 1( needs PAQ signature for WC).   NCM spoke with [facility] she is aware of above. She is requesting NCM to email chart and orders to her at [email or web address]. Patient and wife consented signed release of information form.   Email since. Once 3 in 1 order signed will send.    Claim [id number]  Sunny Schlein will order walker and 3 in 1 through [employer]. [employer] will call patient for delivery . Patient and wife aware    1124 all requested information including signed orders for walker and 3 in 1 emailed to [facility]  Expected Discharge Plan: Home/Self Care Barriers to Discharge: No Barriers Identified   Patient Goals and CMS Choice Patient states their goals for this hospitalization and ongoing recovery are:: to return to home CMS Medicare.gov Compare Post Acute Care list provided to:: Patient Choice offered to / list presented to : Patient, Spouse  Expected Discharge Plan and Services Expected Discharge Plan: Home/Self Care   Discharge Planning Services: CM Consult Post Acute Care Choice: Durable Medical Equipment Living arrangements for the past 2 months: Single Family Home Expected Discharge Date: 12/09/21               DME Arranged: 3-N-1, Walker rolling       Representative spoke with at DME Agency: Sharion Balloon with [employer] HH Arranged: NA HH Agency: NA        Prior Living Arrangements/Services Living  arrangements for the past 2 months: Single Family Home Lives with:: Spouse Patient language and need for interpreter reviewed:: Yes Do you feel safe going back to the place where you live?: Yes      Need for Family Participation in Patient Care: Yes (Comment) Care giver support system in place?: Yes (comment)   Criminal Activity/Legal Involvement Pertinent to Current Situation/Hospitalization: No - Comment as needed  Activities of Daily Living Home Assistive Devices/Equipment: None ADL Screening (condition at time of admission) Patient's cognitive ability adequate to safely complete daily activities?: Yes Is the patient deaf or have difficulty hearing?: No Does the patient have difficulty seeing, even when wearing glasses/contacts?: No Does the patient have difficulty concentrating, remembering, or making decisions?: No Patient able to express need for assistance with ADLs?: Yes Does the patient have difficulty dressing or bathing?: No Independently performs ADLs?: No Communication: Independent Dressing (OT): Needs assistance Is this a change from baseline?: Change from baseline, expected to last <3days Grooming: Needs assistance Is this a change from baseline?: Change from baseline, expected to last <3 days Feeding: Independent Bathing: Needs assistance Is this a change from baseline?: Change from baseline, expected to last <3 days Toileting: Needs assistance Is this a change from baseline?: Change from baseline, expected to last <3 days In/Out Bed: Needs assistance Is this a change from baseline?: Change from baseline, expected to last <3 days Walks in  Home: Independent Does the patient have difficulty walking or climbing stairs?: No Weakness of Legs: Left Weakness of Arms/Hands: None  Permission Sought/Granted   Permission granted to share information with : Yes, Verbal Permission Granted  Share Information with NAME: Wife Selena Batten     Permission granted to share info w  Relationship: wife     Emotional Assessment Appearance:: Appears stated age Attitude/Demeanor/Rapport: Engaged Affect (typically observed): Accepting Orientation: : Oriented to Self, Oriented to Place, Oriented to  Time, Oriented to Situation Alcohol / Substance Use: Not Applicable Psych Involvement: No (comment)  Admission diagnosis:  Type III open fracture of distal end of left fibula, unspecified fracture morphology, initial encounter [S82.832C] Type III open fracture of distal end of left tibia, unspecified fracture morphology, initial encounter [S82.302C] Postop check [Z09] Pilon fracture [U76.546T] Patient Active Problem List   Diagnosis Date Noted   Urethral meatal stenosis 12/10/2021   Acute urinary retention 12/10/2021   Open displaced pilon fracture of left tibia 12/05/2021   PCP:  Patient, No Pcp Per Pharmacy:   [facility] - [place], [place] - [address] [place] [address] Phone: [phone] Fax: [phone]     Social Determinants of Health (SDOH) Interventions    Readmission Risk Interventions     No data to display

## 2022-01-23 NOTE — Progress Notes (Signed)
New Patient Office Visit  Subjective    Patient ID: Kenneth Calhoun, male    DOB: [birth year]  Age: 61 y.o. MRN: [id number]  CC:  Chief Complaint  Patient presents with   Annual Exam    Np. Est care. Pt requesting referral for sleep study due to chronic snoring.     HPI Kenneth Calhoun presents for new patient visit to establish care.  Introduced to Publishing rights manager role and practice setting.  All questions answered.  Discussed provider/patient relationship and expectations.  Kenneth Calhoun has not been to a primary care provider in the past 20 years.   December 05, 2021 he fell about 14 feet from a ladder and fractured his left tibia.  He required surgery and is currently wearing a cam boot.  He is going to physical therapy and following with orthopedics.  He is currently about 25% weightbearing on this leg.  Overall his pain is doing okay, he does note some crawling sensations intermittently.  He states that he slipped off a ladder and did not experience any chest pain, loss of consciousness before the fall.  He states that he has been having some anxiety and depression regarding his current health and from the fall.  He is currently taking amitriptyline at bedtime to help with sleep and some of the nerve pain.  He is also in the process of trying to find a therapist.     01/24/2022    4:19 PM  Depression screen PHQ 2/9  Decreased Interest 0  Down, Depressed, Hopeless 3  PHQ - 2 Score 3  Altered sleeping 3  Tired, decreased energy 0  Change in appetite 0  Feeling bad or failure about yourself  2  Trouble concentrating 2  Moving slowly or fidgety/restless 2  Suicidal thoughts 0  PHQ-9 Score 12      01/24/2022    4:20 PM  GAD 7 : Generalized Anxiety Score  Nervous, Anxious, on Edge 1  Control/stop worrying 3  Worry too much - different things 3  Restless 1  Easily annoyed or irritable 1  Afraid - awful might happen 3    Outpatient Encounter Medications as of 01/24/2022  Medication  Sig   amitriptyline (ELAVIL) 25 MG tablet    docusate sodium (COLACE) 100 MG capsule    famotidine (PEPCID AC) 10 MG tablet    multivitamin (ONE-A-DAY MEN'S) TABS tablet    acetaminophen (TYLENOL) 500 MG tablet Take 2 tablets (1,000 mg total) by mouth every 8 (eight) hours.   aspirin EC 325 MG tablet Take 1 tablet (325 mg total) by mouth daily. to prevent blood clots after surgery   methocarbamol (ROBAXIN) 500 MG tablet Take 1 tablet (500 mg total) by mouth every 8 (eight) hours as needed for muscle spasms.   [DISCONTINUED] diphenhydramine-acetaminophen (TYLENOL PM) 25-500 MG TABS tablet Take 1 tablet by mouth at bedtime as needed (sleep).   [DISCONTINUED] ondansetron (ZOFRAN) 4 MG tablet Take 1 tablet (4 mg total) by mouth every 6 (six) hours as needed for nausea.   [DISCONTINUED] oxyCODONE (OXY IR/ROXICODONE) 5 MG immediate release tablet Take 1-2 tablets (5-10 mg total) by mouth every 6 (six) hours as needed for severe pain.   No facility-administered encounter medications on file as of 01/24/2022.    Past Medical History:  Diagnosis Date   Arthritis    Depression 12/2021   The noted that he thinks i have Depression and possibly PTSD from my fall in July.  We are working  on gettkng counseling now.   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    Past Surgical History:  Procedure Laterality Date   APPENDECTOMY     FRACTURE SURGERY  12/05/2021   OPEN REDUCTION INTERNAL FIXATION (ORIF) TIBIA/FIBULA FRACTURE Left 12/07/2021   Procedure: OPEN REDUCTION INTERNAL FIXATION (ORIF) PILON FRACTURE;  Surgeon: Roby Lofts, MD;  Location: [facility] OR;  Service: Orthopedics;  Laterality: Left;    Family History  Problem Relation Age of Onset   Alcohol abuse Mother    Alcohol abuse Father    Arthritis Sister    Diabetes Sister    Obesity Sister    Arthritis Sister    Depression Sister    Arthritis Brother    Depression Sister    Diabetes Sister     Obesity Sister     Social History:  Social History   Tobacco Use   Smoking status: Never   Smokeless tobacco: Never  Vaping Use   Vaping Use: Never used  Substance Use Topics   Alcohol use: No   Drug use: No      Review of Systems  Constitutional: Negative.   HENT: Negative.    Eyes:  Positive for discharge (watery when looking at electronics).  Respiratory: Negative.    Cardiovascular: Negative.   Gastrointestinal:  Positive for heartburn. Negative for constipation, diarrhea, nausea and vomiting.  Genitourinary:  Positive for frequency. Negative for dysuria.  Musculoskeletal:        Left leg pain  Skin: Negative.   Neurological: Negative.   Psychiatric/Behavioral:  Positive for depression. The patient is nervous/anxious.      Objective    BP 134/82 (BP Location: Left Arm, Cuff Size: Large)   Pulse 78   Temp 97.7 F (36.5 C) (Temporal)   Wt 227 lb 3.2 oz (103.1 kg)   SpO2 96%   BMI 32.14 kg/m   Physical Exam Vitals and nursing note reviewed.  Constitutional:      Appearance: Normal appearance.  HENT:     Head: Normocephalic.     Right Ear: Tympanic membrane, ear canal and external ear normal.     Left Ear: External ear normal. There is impacted cerumen.  Eyes:     Conjunctiva/sclera: Conjunctivae normal.  Cardiovascular:     Rate and Rhythm: Normal rate and regular rhythm.     Pulses: Normal pulses.     Heart sounds: Normal heart sounds.  Pulmonary:     Effort: Pulmonary effort is normal.     Breath sounds: Normal breath sounds.  Abdominal:     Palpations: Abdomen is soft.     Tenderness: There is no abdominal tenderness.     Hernia: A hernia (umbilical) is present.  Musculoskeletal:     Cervical back: Normal range of motion and neck supple. No tenderness.     Comments: Left leg in cam boot  Lymphadenopathy:     Cervical: No cervical adenopathy.  Skin:    General: Skin is warm.  Neurological:     General: No focal deficit present.      Mental Status: He is alert and oriented to person, place, and time.  Psychiatric:        Mood and Affect: Mood normal.        Behavior: Behavior normal.        Thought Content: Thought content normal.        Judgment: Judgment normal.       Assessment &  Plan:   Problem List Items Addressed This Visit       Endocrine   IFG (impaired fasting glucose)    Glucose was elevated in the hospital, will check A1c today      Relevant Orders   Comprehensive metabolic panel   Hemoglobin A1c     Other   Screening for colon cancer    Colon cancer screening options discussed and he would like to proceed with Cologuard.  Order placed      Relevant Orders   Cologuard   Snoring    He states that his wife says that he snores at night, and he may even stop breathing.  When he was in the hospital, he slept better with oxygen and was told he should probably get a sleep study.  Referral placed to sleep medicine.      Relevant Orders   Ambulatory referral to Sleep Studies   Elevated serum creatinine    His creatinine was elevated at 1.6 and GFR was low in the 50s.  We will recheck CMP today      Relevant Orders   Comprehensive metabolic panel   CBC   History of fracture of lower leg - Primary    He fell 2 months ago off a ladder and fractured his left tibia.  He had surgery completed and is currently wearing a cam boot and using a walker.  He is currently 25% weightbearing and is following with orthopedics and PT.  Continue recommendations in collaboration from orthopedics.      Anxiety    He is having some situational anxiety after his fall off the ladder and leg fracture.  His GAD-7 is a 12 and his PHQ-9 is a 12.  He is currently taking amitriptyline 25 mg at bedtime to help with sleep, anxiety, and nerve pain.  We will have him continue this regimen, follow-up with any concerns.  He is also in the process of looking for a therapist.      Relevant Medications   amitriptyline (ELAVIL) 25  MG tablet   Umbilical hernia without obstruction and without gangrene    He has had a hernia to his umbilical area for the past several years.  It is not causing him any pain.  Discussed warning signs to watch out for including pain, discoloration.  He is not interested in a referral to surgery at this time.  Follow-up with any concerns.      Other Visit Diagnoses     Screening for HIV (human immunodeficiency virus)       Screen HIV   Relevant Orders   HIV Antibody (routine testing w rflx)   Encounter for hepatitis C screening test for low risk patient       Screen hepatitis C   Relevant Orders   Hepatitis C antibody   Screening PSA (prostate specific antigen)       Screen PSA today   Relevant Orders   PSA   Screening, lipid       Screen lipid panel    Relevant Orders   Lipid panel       Return in about 3 months (around 04/25/2022) for 3-6 months , CPE.   Kenneth Scull, NP

## 2022-01-24 ENCOUNTER — Encounter: Payer: Self-pay | Admitting: Nurse Practitioner

## 2022-01-24 ENCOUNTER — Ambulatory Visit: Payer: 59 | Admitting: Nurse Practitioner

## 2022-01-24 VITALS — BP 134/82 | HR 78 | Temp 97.7°F | Wt 227.2 lb

## 2022-01-24 DIAGNOSIS — R0683 Snoring: Secondary | ICD-10-CM

## 2022-01-24 DIAGNOSIS — Z13220 Encounter for screening for lipoid disorders: Secondary | ICD-10-CM

## 2022-01-24 DIAGNOSIS — Z125 Encounter for screening for malignant neoplasm of prostate: Secondary | ICD-10-CM | POA: Diagnosis not present

## 2022-01-24 DIAGNOSIS — Z114 Encounter for screening for human immunodeficiency virus [HIV]: Secondary | ICD-10-CM

## 2022-01-24 DIAGNOSIS — F419 Anxiety disorder, unspecified: Secondary | ICD-10-CM

## 2022-01-24 DIAGNOSIS — R7301 Impaired fasting glucose: Secondary | ICD-10-CM | POA: Diagnosis not present

## 2022-01-24 DIAGNOSIS — R7989 Other specified abnormal findings of blood chemistry: Secondary | ICD-10-CM | POA: Diagnosis not present

## 2022-01-24 DIAGNOSIS — Z1211 Encounter for screening for malignant neoplasm of colon: Secondary | ICD-10-CM

## 2022-01-24 DIAGNOSIS — K429 Umbilical hernia without obstruction or gangrene: Secondary | ICD-10-CM

## 2022-01-24 DIAGNOSIS — Z8781 Personal history of (healed) traumatic fracture: Secondary | ICD-10-CM

## 2022-01-24 DIAGNOSIS — Z1159 Encounter for screening for other viral diseases: Secondary | ICD-10-CM

## 2022-01-24 NOTE — Patient Instructions (Signed)
It was great to see you!  We are checking your labs today and will let you know the results via mychart/phone.   Keep your appointment with PT and orthopedics.   Let's follow-up in 3-6 months, sooner if you have concerns.  If a referral was placed today, you will be contacted for an appointment. Please note that routine referrals can sometimes take up to 3-4 weeks to process. Please call our office if you haven't heard anything after this time frame.  Take care,  [name], NP

## 2022-01-24 NOTE — Assessment & Plan Note (Signed)
He states that his wife says that he snores at night, and he may even stop breathing.  When he was in the hospital, he slept better with oxygen and was told he should probably get a sleep study.  Referral placed to sleep medicine.

## 2022-01-24 NOTE — Assessment & Plan Note (Signed)
He fell 2 months ago off a ladder and fractured his left tibia.  He had surgery completed and is currently wearing a cam boot and using a walker.  He is currently 25% weightbearing and is following with orthopedics and PT.  Continue recommendations in collaboration from orthopedics.

## 2022-01-24 NOTE — Assessment & Plan Note (Signed)
Glucose was elevated in the hospital, will check A1c today

## 2022-01-24 NOTE — Assessment & Plan Note (Signed)
Colon cancer screening options discussed and he would like to proceed with Cologuard.  Order placed

## 2022-01-24 NOTE — Assessment & Plan Note (Addendum)
He has had a hernia to his umbilical area for the past several years.  It is not causing him any pain.  Discussed warning signs to watch out for including pain, discoloration.  He is not interested in a referral to surgery at this time.  Follow-up with any concerns.

## 2022-01-24 NOTE — Assessment & Plan Note (Signed)
His creatinine was elevated at 1.6 and GFR was low in the 50s.  We will recheck CMP today

## 2022-01-24 NOTE — Assessment & Plan Note (Signed)
He is having some situational anxiety after his fall off the ladder and leg fracture.  His GAD-7 is a 12 and his PHQ-9 is a 12.  He is currently taking amitriptyline 25 mg at bedtime to help with sleep, anxiety, and nerve pain.  We will have him continue this regimen, follow-up with any concerns.  He is also in the process of looking for a therapist.

## 2022-01-25 LAB — COMPREHENSIVE METABOLIC PANEL
ALT: 23 U/L (ref 0–53)
AST: 18 U/L (ref 0–37)
Albumin: 4 g/dL (ref 3.5–5.2)
Alkaline Phosphatase: 68 U/L (ref 39–117)
BUN: 19 mg/dL (ref 6–23)
CO2: 26 mEq/L (ref 19–32)
Calcium: 9 mg/dL (ref 8.4–10.5)
Chloride: 104 mEq/L (ref 96–112)
Creatinine, Ser: 1.5 mg/dL (ref 0.40–1.50)
GFR: 50.06 mL/min — ABNORMAL LOW (ref >60.00)
Glucose, Bld: 107 mg/dL — ABNORMAL HIGH (ref 70–99)
Potassium: 4.1 mEq/L (ref 3.5–5.1)
Sodium: 139 mEq/L (ref 135–145)
Total Bilirubin: 0.5 mg/dL (ref 0.2–1.2)
Total Protein: 6.6 g/dL (ref 6.0–8.3)

## 2022-01-25 LAB — LIPID PANEL
Cholesterol: 155 mg/dL (ref 0–200)
HDL: 36.1 mg/dL — ABNORMAL LOW (ref >39.00)
NonHDL: 118.46
Total CHOL/HDL Ratio: 4
Triglycerides: 225 mg/dL — ABNORMAL HIGH (ref 0.0–149.0)
VLDL: 45 mg/dL — ABNORMAL HIGH (ref 0.0–40.0)

## 2022-01-25 LAB — CBC
HCT: 44.7 % (ref 39.0–52.0)
Hemoglobin: 15.1 g/dL (ref 13.0–17.0)
MCHC: 33.8 g/dL (ref 30.0–36.0)
MCV: 85.1 fl (ref 78.0–100.0)
Platelets: 303 10*3/uL (ref 150.0–400.0)
RBC: 5.25 Mil/uL (ref 4.22–5.81)
RDW: 13.3 % (ref 11.5–15.5)
WBC: 7.8 10*3/uL (ref 4.0–10.5)

## 2022-01-25 LAB — LDL CHOLESTEROL, DIRECT: Direct LDL: 130 mg/dL

## 2022-01-25 LAB — HEMOGLOBIN A1C: Hgb A1c MFr Bld: 4.8 % (ref 4.6–6.5)

## 2022-01-25 LAB — PSA: PSA: 1.37 ng/mL (ref 0.10–4.00)

## 2022-01-25 LAB — HEPATITIS C ANTIBODY: Hepatitis C Ab: NONREACTIVE

## 2022-01-25 LAB — HIV ANTIBODY (ROUTINE TESTING W REFLEX): HIV 1&2 Ab, 4th Generation: NONREACTIVE

## 2022-01-30 ENCOUNTER — Ambulatory Visit: Payer: 59 | Admitting: Nurse Practitioner

## 2022-02-27 ENCOUNTER — Encounter: Payer: Self-pay | Admitting: Neurology

## 2022-02-27 ENCOUNTER — Ambulatory Visit (INDEPENDENT_AMBULATORY_CARE_PROVIDER_SITE_OTHER): Payer: 59 | Admitting: Neurology

## 2022-02-27 VITALS — BP 138/76 | HR 80 | Ht 70.0 in | Wt 228.0 lb

## 2022-02-27 DIAGNOSIS — G4730 Sleep apnea, unspecified: Secondary | ICD-10-CM

## 2022-02-27 DIAGNOSIS — G4734 Idiopathic sleep related nonobstructive alveolar hypoventilation: Secondary | ICD-10-CM

## 2022-02-27 DIAGNOSIS — Z9189 Other specified personal risk factors, not elsewhere classified: Secondary | ICD-10-CM | POA: Insufficient documentation

## 2022-02-27 DIAGNOSIS — R0683 Snoring: Secondary | ICD-10-CM | POA: Diagnosis not present

## 2022-02-27 DIAGNOSIS — G4710 Hypersomnia, unspecified: Secondary | ICD-10-CM

## 2022-02-27 NOTE — Progress Notes (Signed)
SLEEP MEDICINE CLINIC    Provider:  Larey Seat, MD  Primary Care Physician:  Charyl Dancer, NP [place] [address]     Referring Provider: Charyl Dancer, [place],  [place] [address]          Chief Complaint according to patient   Patient presents with:     New Patient (Initial Visit)           HISTORY OF PRESENT ILLNESS:  Kenneth Calhoun is a 61 y.o. year old White or Caucasian male patient seen here as a referral on 02/27/2022 from NP [facility] for a sleep consult .  Chief concern according to patient : "I am snoring and my wife says I had apnea" " I sleep with tylenol PM "    Kenneth Calhoun   has a past medical history of Arthritis, Depression (12/2021), Gastritis, GERD (gastroesophageal reflux disease), and Urethral meatal stenosis (12/10/2021). Recent fall and fracture.  He was hospitalized and snored and had apnea, as witnessed by nursing staff. He ws given oxygen !      Sleep relevant medical history: Nocturia 2-4 times.    Family medical /sleep history: sister on CPAP with OSA, insomnia, sleep walkers.    Social history:  Patient is working as Chief Executive Officer and lives in a household with spouse,  one son - 82 in the home, 1 dog. Tobacco use; never. ETOH use ; none,  Caffeine intake in form of Coffee( 3 ups in Am ) Soda( /) Tea ( /) or energy drinks. Regular exercise in form of none-.  He is a part time farmer.       Sleep habits are as follows: The patient's dinner time is between 6-8 PM. The patient goes to bed at 8-10 PM and continues to sleep for intervals of 2 hours,  in a recliner - for years. wakes for 2-4 bathroom breaks, the first time at 1 AM.   The preferred sleep position is reclined, with the support of 1 pillow.  Dreams are reportedly frequent/vivid.   5.30 AM is the usual rise time. The patient wakes up spontaneously/ 5.30 .  He reports not feeling refreshed or restored in AM,  with symptoms such as dry mouth, morning headaches, and residual fatigue.  Naps are taken frequently, lasting from 45 to 60 minutes.    Review of Systems: Out of a complete 14 system review, the patient complains of only the following symptoms, and all other reviewed systems are negative.:  Fatigue, sleepiness , snoring, fragmented sleep, recently out of work 10 weeks for fracture.    How likely are you to doze in the following situations: 0 = not likely, 1 = slight chance, 2 = moderate chance, 3 = high chance   Sitting and Reading? Watching Television? Sitting inactive in a public place (theater or meeting)? As a passenger in a car for an hour without a break? Lying down in the afternoon when circumstances permit? Sitting and talking to someone? Sitting quietly after lunch without alcohol? In a car, while stopped for a few minutes in traffic?   Total = 10/ 24 points   FSS endorsed at 40/ 63 points.   GDS : 6/ 15 points.   Social History   Socioeconomic History   Marital status: Married    Spouse name: Not on file   Number of children: Not on file   Years of education: Not  on file   Highest education level: Not on file  Occupational History   Not on file  Tobacco Use   Smoking status: Never   Smokeless tobacco: Never  Vaping Use   Vaping Use: Never used  Substance and Sexual Activity   Alcohol use: No   Drug use: No   Sexual activity: Yes    Birth control/protection: None  Other Topics Concern   Not on file  Social History Narrative   Not on file   Social Determinants of Health   Financial Resource Strain: Not on file  Food Insecurity: Not on file  Transportation Needs: Not on file  Physical Activity: Not on file  Stress: Not on file  Social Connections: Not on file    Family History  Problem Relation Age of Onset   Alcohol abuse Mother    Alcohol abuse Father    Arthritis Sister    Diabetes Sister    Obesity Sister    Arthritis Sister    Depression  Sister    Arthritis Brother    Depression Sister    Diabetes Sister    Obesity Sister     Past Medical History:  Diagnosis Date   Arthritis    Depression 12/2021   The noted that he thinks i have Depression and possibly PTSD from my fall in July.  We are working on [employer] counseling now.   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 30 tablet 0   amitriptyline (ELAVIL) 25 MG tablet      aspirin EC 325 MG tablet Take 1 tablet (325 mg total) by mouth daily. to prevent blood clots after surgery 30 tablet 0   docusate sodium (COLACE) 100 MG capsule      famotidine (PEPCID AC) 10 MG tablet      methocarbamol (ROBAXIN) 500 MG tablet Take 1 tablet (500 mg total) by mouth every 8 (eight) hours as needed for muscle spasms. 20 tablet 0   multivitamin (ONE-A-DAY MEN'S) TABS tablet      Tart Cherry 1200 MG CAPS Take 1,000 mg by mouth 2 (two) times daily.     No current facility-administered medications on file prior to visit.    No Known Allergies  Physical exam:  Today's Vitals   02/27/22 0857  BP: 138/76  Pulse: 80  Weight: 228 lb (103.4 kg)  Height: 5\' 10"  (1.778 m)   Body mass index is 32.71 kg/m.   Wt Readings from Last 3 Encounters:  02/27/22 228 lb (103.4 kg)  01/24/22 227 lb 3.2 oz (103.1 kg)  12/05/21 217 lb (98.4 kg)     Ht Readings from Last 3 Encounters:  02/27/22 5\' 10"  (1.778 m)   12/05/21 5' 10.5" (1.791 m)  06/11/18 5\' 10"  (1.778 m)      General: The patient is awake, alert and appears not in acute distress. The patient is well groomed. Head: Normocephalic, atraumatic.  Neck is supple.  Mallampati 2,  neck circumference:18 inches . Nasal airflow restricted -patent.  Retrognathia is not seen.  Dental status: biological  Cardiovascular:  Regular rate and cardiac rhythm by pulse,  without distended neck veins. Respiratory: Lungs are clear to auscultation.  Skin:  Without evidence of ankle edema, or rash. Trunk: The patient's posture is erect.   Neurologic exam : The patient is awake and alert, oriented to place and time.   Memory subjective described as impaired  Attention span & concentration ability appears normal.  Speech is fluent, with dysphonia .  Mood and affect are appropriate.   Cranial nerves: no loss of smell or taste reported  Pupils are equal and briskly reactive to light. Funduscopic exam deferred. .  Extraocular movements in vertical and horizontal planes were intact and without nystagmus. No Diplopia. Visual fields by finger perimetry are intact. Hearing was intact to soft voice and finger rubbing.    Facial sensation intact to fine touch.  Facial motor strength is symmetric and tongue and uvula move midline.  Neck ROM : rotation, tilt and flexion extension were normal for age and shoulder shrug was symmetrical.    Motor exam:  Symmetric bulk, tone and ROM.   Normal tone without cog wheeling, symmetric grip strength .   Sensory:  Fine touch, pinprick and vibration were tested  and  normal.  Proprioception tested in the upper extremities was normal.   Coordination: Rapid alternating movements in the fingers/hands were of normal speed.  The Finger-to-nose maneuver was intact without evidence of ataxia, dysmetria or tremor.   Gait and station: Patient could not rise unassisted from a seated position, walked with a walker and has left lower  leg brace.  Deep tendon reflexes: in the upper extremities are symmetric and intact.  Babinski response was deferred.        After spending a total time of  40  minutes face to face and additional time for physical and neurologic examination, review of laboratory studies,  personal review of imaging studies, reports and results of other testing and review of referral information / records as far as provided in visit, I have established the following assessments:  1) I have the pleasure of meeting [name] today in the presence of his wife.  He has had quite an ordeal after he had an open and displaced pilon fracture of the left tibia and was hospitalized from 26 July through 31 July of this year.  He was given fairly heavy pain medication as required during his hospitalization and the nurses as well as Mrs. Ryle have noticed his loud snoring and witnessed apneas.  There must have been also hypoxemia present because the patient was put on oxygen after which she slept better.  He is seen here today still out of work and still with some impairment to his ambulation his fatigue severity scale was endorsed at an elevated level of 40 out of 63 points, his PHQ-9 was 12 points his GAD was 712 points and his sleepiness score he scored 9-10 points.  2) grogginess in AM, new headaches in AM   Of course memory decline can also related to low oxygen levels.  So I think this is urgently needed for this patient to be screened for obstructive sleep apnea as well as possible hypoxemia. He has orthopnea for years, stated he started 10 years ago because of GERD.   3) Insomnia- fragmented sleep   He does endorse some worries right now  as he is self-employed and out of work.  My main concern is that this patient has self medicated usually with Benadryl in the form of Tylenol PM which helped him to sleep but he has noticed a decrease in memory function and I do wonder if this is got correlated.   My Plan is to proceed  with:  1) HST or PSG, which is quicker ASAP.  2) Trazodone for sleep.  3) keep amitriptyline for nerve pain.    I would like to thank Charyl Dancer, NP and Charyl Dancer, [place],  [place] [id number] for allowing me to meet with and to take care of this pleasant patient.   In short, Kenneth Calhoun is presenting with orthopnea, snoring, witnessed apnea, and hypoxia.  a symptom that can be attributed to    I plan to follow up either personally or through our NP within 2-4 months.    Electronically signed by: Larey Seat, MD 02/27/2022 9:15 AM  [facility] Board certified by The [employer] of Sleep Medicine and Diplomate of the [employer] of Sleep Medicine. Board certified In Neurology through the [employer], Fellow of the [employer] of Neurology. Medical Director of [facility].

## 2022-03-05 ENCOUNTER — Other Ambulatory Visit: Payer: Self-pay | Admitting: Student

## 2022-03-05 DIAGNOSIS — S82832E Other fracture of upper and lower end of left fibula, subsequent encounter for open fracture type I or II with routine healing: Secondary | ICD-10-CM

## 2022-03-06 ENCOUNTER — Ambulatory Visit
Admission: RE | Admit: 2022-03-06 | Discharge: 2022-03-06 | Disposition: A | Discharge status: Home / Self Care | Payer: Worker's Compensation | Source: Ambulatory Visit | Attending: Student | Admitting: Student

## 2022-03-06 DIAGNOSIS — S82872E Displaced pilon fracture of left tibia, subsequent encounter for open fracture type I or II with routine healing: Secondary | ICD-10-CM

## 2022-03-07 LAB — COLOGUARD: COLOGUARD: NEGATIVE

## 2022-03-13 ENCOUNTER — Encounter: Payer: Self-pay | Admitting: Nurse Practitioner

## 2022-03-18 ENCOUNTER — Encounter: Payer: Self-pay | Admitting: Nurse Practitioner

## 2022-03-18 ENCOUNTER — Ambulatory Visit: Payer: 59 | Admitting: Nurse Practitioner

## 2022-03-18 ENCOUNTER — Ambulatory Visit (INDEPENDENT_AMBULATORY_CARE_PROVIDER_SITE_OTHER): Payer: 59

## 2022-03-18 VITALS — BP 130/76 | HR 88 | Temp 98.3°F | Wt 234.6 lb

## 2022-03-18 DIAGNOSIS — M5441 Lumbago with sciatica, right side: Secondary | ICD-10-CM

## 2022-03-18 DIAGNOSIS — G8929 Other chronic pain: Secondary | ICD-10-CM

## 2022-03-18 DIAGNOSIS — R209 Unspecified disturbances of skin sensation: Secondary | ICD-10-CM

## 2022-03-18 MED ORDER — GABAPENTIN 300 MG PO CAPS: 300.0000 mg | ORAL_CAPSULE | Freq: Every day | ORAL | 2 refills | Status: DC

## 2022-03-18 NOTE — Assessment & Plan Note (Signed)
Right foot is cool to palpation, however pulses are strong.  He had a negative ultrasound for DVT, will order ABIs of his right leg.  Consider referral to vascular depending on results of x-ray and MRI.

## 2022-03-18 NOTE — Assessment & Plan Note (Signed)
Symptoms most likely due to sciatica with his right lower back pain.  He states that it started originally in July, then went away, then returned about 4 days ago.  Pulses are strong in his feet.  His right foot is cool to touch.  We will check an x-ray of his lower back, with most likely needing MRI of lumbar spine after this results.  Encouraged him to continue to physical therapy.  We will also start him on gabapentin 300 mg at bedtime.  Follow-up in 4 weeks.

## 2022-03-18 NOTE — Progress Notes (Unsigned)
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 No injuries since

## 2022-03-18 NOTE — Progress Notes (Signed)
Established Patient Office Visit  Subjective   Patient ID: Kenneth Calhoun, male    DOB: March 06, 1961  Age: 61 y.o. MRN: [id number]  Chief Complaint  Patient presents with   Follow-up    Pt c/o abnormal cold sensation in RT leg/thigh/foot with severe pain. Pt requesting MRI of legs.     HPI  Kenneth Calhoun has been experiencing a cold sensation with burning in his right foot, calf, thigh.  He states that on Thursday of last week, he noted severe back pain on the right lower side.  He reached out to his [employer] provider and they ordered an ultrasound of his right calf which was negative for DVT and had him start doing physical therapy on his back.  When he had his fall off the ladder, he states he landed on his buttocks and did not have any x-rays of his lower back completed.  He states that the burning sensation in changing temperature, happens when he is sitting, however goes away after he stands up and moves around.  He takes 2 Tylenol daily for pain.  He is currently 50% weightbearing on his left leg now.  He is still taking aspirin 325 mg daily.    ROS See pertinent positives and negatives per HPI.    Objective:     BP 130/76 (BP Location: Left Arm, Cuff Size: Large)   Pulse 88   Temp 98.3 F (36.8 C) (Temporal)   Wt 234 lb 9.6 oz (106.4 kg)   SpO2 97%   BMI 33.66 kg/m  BP Readings from Last 3 Encounters:  03/18/22 130/76  02/27/22 138/76  01/24/22 134/82   Wt Readings from Last 3 Encounters:  03/18/22 234 lb 9.6 oz (106.4 kg)  02/27/22 228 lb (103.4 kg)  01/24/22 227 lb 3.2 oz (103.1 kg)     Physical Exam Vitals and nursing note reviewed.  Constitutional:      Appearance: Normal appearance.  HENT:     Head: Normocephalic.  Eyes:     Conjunctiva/sclera: Conjunctivae normal.  Cardiovascular:     Rate and Rhythm: Normal rate and regular rhythm.     Pulses: Normal pulses.     Heart sounds: Normal heart sounds.  Pulmonary:     Effort: Pulmonary effort is  normal.     Breath sounds: Normal breath sounds.  Musculoskeletal:        General: No swelling or tenderness.     Cervical back: Normal range of motion.     Comments: Right foot is cool to touch, strong pulses  Skin:    General: Skin is warm.  Neurological:     General: No focal deficit present.     Mental Status: He is alert and oriented to person, place, and time.  Psychiatric:        Mood and Affect: Mood normal.        Behavior: Behavior normal.        Thought Content: Thought content normal.        Judgment: Judgment normal.    The 10-year ASCVD risk score (Arnett DK, et al., 2019) is: 9.6%    Assessment & Plan:   Problem List Items Addressed This Visit       Nervous and Auditory   Chronic right-sided low back pain with right-sided sciatica - Primary    Symptoms most likely due to sciatica with his right lower back pain.  He states that it started originally in July, then went away, then  returned about 4 days ago.  Pulses are strong in his feet.  His right foot is cool to touch.  We will check an x-ray of his lower back, with most likely needing MRI of lumbar spine after this results.  Encouraged him to continue to physical therapy.  We will also start him on gabapentin 300 mg at bedtime.  Follow-up in 4 weeks.      Relevant Medications   amitriptyline (ELAVIL) 50 MG tablet   gabapentin (NEURONTIN) 300 MG capsule   Other Relevant Orders   DG Lumbar Spine Complete     Other   Sensation of cold in lower extremity    Right foot is cool to palpation, however pulses are strong.  He had a negative ultrasound for DVT, will order ABIs of his right leg.  Consider referral to vascular depending on results of x-ray and MRI.      Relevant Orders   VAS [place] ABI WITH/WO TBI    Return if symptoms worsen or fail to improve.    Charyl Dancer, NP

## 2022-03-18 NOTE — Patient Instructions (Signed)
It was great to see you!  Start gabapentin 1 capsule at bedtime for your pain and numbness.   We are checking an x-ray today and will most likely order a MRI when that comes back.   Keep doing the Physical therapy. I have attached some stretches, however don't do the ones that put too much pressure on your leg.   Let's follow-up at your next scheduled visit.   Take care,  Vance Peper, NP

## 2022-03-20 ENCOUNTER — Encounter: Payer: Self-pay | Admitting: Nurse Practitioner

## 2022-03-26 ENCOUNTER — Telehealth: Payer: Self-pay | Admitting: Neurology

## 2022-03-26 NOTE — Telephone Encounter (Signed)
HST- [employer] auth: [id number] (exp. 03/04/22 to 05/02/22)   Patient is scheduled at [facility] for 04/23/22 at 3:30 pm.   Mailed packet to the patient.  

## 2022-03-26 NOTE — Telephone Encounter (Signed)
Error message below.  HST- [employer] no auth req.  Patient is scheduled at [facility] for 04/16/22 at 11:30 AM.  Mailed packet to the patient.

## 2022-03-28 MED ORDER — GABAPENTIN 300 MG PO CAPS: 300.0000 mg | ORAL_CAPSULE | Freq: Three times a day (TID) | ORAL | 1 refills | Status: DC

## 2022-03-29 ENCOUNTER — Ambulatory Visit (HOSPITAL_COMMUNITY)
Admission: RE | Admit: 2022-03-29 | Discharge: 2022-03-29 | Disposition: A | Discharge status: Home / Self Care | Payer: 59 | Source: Ambulatory Visit | Attending: Nurse Practitioner | Admitting: Nurse Practitioner

## 2022-03-29 DIAGNOSIS — R209 Unspecified disturbances of skin sensation: Secondary | ICD-10-CM | POA: Diagnosis not present

## 2022-03-29 NOTE — Progress Notes (Signed)
ABI's have been completed. Preliminary results can be found in CV Proc through chart review.   03/29/22 2:21 PM Olen Cordial RVT

## 2022-04-08 ENCOUNTER — Telehealth: Payer: Self-pay | Admitting: Nurse Practitioner

## 2022-04-08 NOTE — Telephone Encounter (Signed)
Called & left Kenneth Calhoun a VM. Needing know how she would want the prior auth to be sent to her for this imaging order.

## 2022-04-08 NOTE — Telephone Encounter (Signed)
Edmon Crape  from [facility] [phone]  they need authorization no later than 3:00 to day so they don't have to cancel the pt appt.

## 2022-04-09 ENCOUNTER — Other Ambulatory Visit: Payer: 59

## 2022-04-10 NOTE — Telephone Encounter (Signed)
Approval and pt is reschedule for tomorrow

## 2022-04-10 NOTE — Telephone Encounter (Signed)
Prior auth for procedures need to go to [employer], our referral coordinator. Adding her to f/u on auth.

## 2022-04-11 ENCOUNTER — Ambulatory Visit
Admission: RE | Admit: 2022-04-11 | Discharge: 2022-04-11 | Disposition: A | Discharge status: Home / Self Care | Payer: 59 | Source: Ambulatory Visit | Attending: Nurse Practitioner | Admitting: Nurse Practitioner

## 2022-04-11 DIAGNOSIS — G8929 Other chronic pain: Secondary | ICD-10-CM

## 2022-04-12 ENCOUNTER — Encounter: Payer: Self-pay | Admitting: Nurse Practitioner

## 2022-04-12 DIAGNOSIS — M5136 Other intervertebral disc degeneration, lumbar region: Secondary | ICD-10-CM

## 2022-04-12 DIAGNOSIS — R209 Unspecified disturbances of skin sensation: Secondary | ICD-10-CM

## 2022-04-16 ENCOUNTER — Ambulatory Visit: Payer: 59 | Admitting: Neurology

## 2022-04-16 DIAGNOSIS — G4733 Obstructive sleep apnea (adult) (pediatric): Secondary | ICD-10-CM

## 2022-04-16 DIAGNOSIS — G4734 Idiopathic sleep related nonobstructive alveolar hypoventilation: Secondary | ICD-10-CM

## 2022-04-16 DIAGNOSIS — R0683 Snoring: Secondary | ICD-10-CM

## 2022-04-16 DIAGNOSIS — G4710 Hypersomnia, unspecified: Secondary | ICD-10-CM

## 2022-04-18 ENCOUNTER — Ambulatory Visit (INDEPENDENT_AMBULATORY_CARE_PROVIDER_SITE_OTHER): Payer: 59 | Admitting: Vascular Surgery

## 2022-04-18 ENCOUNTER — Encounter: Payer: Self-pay | Admitting: Vascular Surgery

## 2022-04-18 VITALS — BP 193/100 | HR 79 | Temp 98.5°F | Resp 20 | Ht 70.0 in | Wt 243.0 lb

## 2022-04-18 DIAGNOSIS — I739 Peripheral vascular disease, unspecified: Secondary | ICD-10-CM

## 2022-04-18 NOTE — Progress Notes (Signed)
ASSESSMENT & PLAN   FOOT PAIN: This patient describes a cold feeling on the plantar aspect of his right foot and also on the right leg.  He experiences this to a lesser degree on the left side.  He also describes some occasional paresthesias.  He does not have any symptoms of peripheral arterial disease.  Specifically he denies any claudication, rest pain, or nonhealing ulcers.  He has palpable pedal pulses, normal Doppler signals, normal ABIs, and normal toe pressures.  Thus he has no evidence of arterial insufficiency to explain his symptoms.  In addition I do not see any evidence of significant venous disease.  He is scheduled to see neurology later today.  But I reassured him that he has no evidence of significant arterial or venous pathology to explain his symptoms.  REASON FOR CONSULT:    To evaluate for peripheral arterial disease.  The consult is requested by Nena Polio, NP.  HPI:   Kenneth Calhoun is a 61 y.o. male who fell off a ladder on 12/05/2021.  He sustained a left tib-fib fracture.  He had been hopping around on his right leg for some time because of this cast on the left.  He was complaining of a cold feeling on the plantar aspect of his right foot and also on his right leg.  He also had some symptoms on the left side.  I do not get any history of claudication, rest pain, or nonhealing ulcers  He does have a history of degenerative disc disease of the back reportedly involving L1-L2 and L2-L3.  He has no real risk factors for peripheral arterial disease.  He denies any history of diabetes, hypertension, hypercholesterolemia, family history of premature cardiovascular disease, or tobacco use.  Past Medical History:  Diagnosis Date   Arthritis    Depression 12/2021   The noted that he thinks i have Depression and possibly PTSD from my fall in July.  We are working on [employer] counseling now.   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    Family History  Problem Relation Age of Onset   Alcohol abuse Mother    Alcohol abuse Father    Arthritis Sister    Diabetes Sister    Obesity Sister    Arthritis Sister    Depression Sister    Arthritis Brother    Depression Sister    Diabetes Sister    Obesity Sister     SOCIAL HISTORY: Social History   Tobacco Use   Smoking status: Never   Smokeless tobacco: Never  Substance Use Topics   Alcohol use: No    No Known Allergies  Current Outpatient Medications  Medication Sig Dispense Refill   acetaminophen (TYLENOL) 500 MG tablet Take 2 tablets (1,000 mg total) by mouth every 8 (eight) hours. 30 tablet 0   amitriptyline (ELAVIL) 50 MG tablet Take 50 mg by mouth at bedtime.     aspirin EC 325 MG tablet Take 1 tablet (325 mg total) by mouth daily. to prevent blood clots after surgery 30 tablet 0   docusate sodium (COLACE) 100 MG capsule      famotidine (PEPCID AC) 10 MG tablet      multivitamin (ONE-A-DAY MEN'S) TABS tablet      Tart Cherry 1200 MG CAPS Take 1,000 mg by mouth 2 (two) times daily.     gabapentin (NEURONTIN) 300 MG capsule Take 1 capsule (300 mg total) by mouth  3 (three) times daily. Increase gabapentin to twice a day for 7 days, then 3 times a day. (Patient not taking: Reported on 04/18/2022) 90 capsule 1   methocarbamol (ROBAXIN) 500 MG tablet Take 1 tablet (500 mg total) by mouth every 8 (eight) hours as needed for muscle spasms. (Patient not taking: Reported on 04/18/2022) 20 tablet 0   No current facility-administered medications for this visit.    REVIEW OF SYSTEMS:  [X]  denotes positive finding, [ ]  denotes negative finding Cardiac  Comments:  Chest pain or chest pressure:    Shortness of breath upon exertion:    Short of breath when lying flat:    Irregular heart rhythm:        Vascular    Pain in calf, thigh, or hip brought on by ambulation: x   Pain in feet at night that wakes you up from your sleep:  x   Blood clot in your  veins:    Leg swelling:  x       Pulmonary    Oxygen at home:    Productive cough:     Wheezing:         Neurologic    Sudden weakness in arms or legs:     Sudden numbness in arms or legs:     Sudden onset of difficulty speaking or slurred speech:    Temporary loss of vision in one eye:     Problems with dizziness:  x       Gastrointestinal    Blood in stool:     Vomited blood:         Genitourinary    Burning when urinating:     Blood in urine:        Psychiatric    Major depression:         Hematologic    Bleeding problems:    Problems with blood clotting too easily:        Skin    Rashes or ulcers:        Constitutional    Fever or chills:    -  PHYSICAL EXAM:   There were no vitals filed for this visit. There is no height or weight on file to calculate BMI. GENERAL: The patient is a well-nourished male, in no acute distress. The vital signs are documented above. CARDIAC: There is a regular rate and rhythm.  VASCULAR: I do not detect carotid bruits. He has palpable femoral, popliteal, dorsalis pedis, and posterior tibial pulses bilaterally. He has no significant lower extremity swelling. PULMONARY: There is good air exchange bilaterally without wheezing or rales. ABDOMEN: Soft and non-tender with normal pitched bowel sounds.  MUSCULOSKELETAL: There are no major deformities. NEUROLOGIC: No focal weakness or paresthesias are detected. SKIN: There are no ulcers or rashes noted. PSYCHIATRIC: The patient has a normal affect.  DATA:    ARTERIAL DOPPLER STUDY: I have reviewed the arterial Doppler study that was done on 03/29/2022.  On the right side there was a biphasic dorsalis pedis and posterior tibial signal.  ABI was 100%.  Toe pressure was 146 mmHg.  On the left side there was a biphasic dorsalis pedis and posterior tibial signal.  ABI was 100%.  Toe pressure was 113 mmHg.  RIGHT LOWER EXTREMITY VENOUS DUPLEX: I reviewed the patient's right lower  extremity venous duplex scan which was done to rule out a DVT.  This was done on 03/06/2022.  There was no evidence of DVT in the right lower extremity.  Deitra Mayo [facility]

## 2022-04-18 NOTE — Progress Notes (Signed)
See procedure note.

## 2022-04-19 ENCOUNTER — Telehealth: Payer: Self-pay | Admitting: Neurology

## 2022-04-19 DIAGNOSIS — G4733 Obstructive sleep apnea (adult) (pediatric): Secondary | ICD-10-CM

## 2022-04-19 NOTE — Telephone Encounter (Signed)
This patient saw Dr. Vickey Huger for sleep evaluation on 02/27/2022.  I read the home sleep test from 04/16/2022 on Dr. Oliva Bustard behalf:   Please call and notify the patient that the recent home sleep test showed obstructive sleep apnea in the moderate range. I recommend treatment in the form of autoPAP, which means, that we don't have to bring him in for a sleep study with CPAP, but will let him start using a so called autoPAP machine at home, which is a CPAP-like machine with self-adjusting pressures. We will send the order to a local DME company (of his choice, or as per insurance requirement). The DME representative will fit him with a mask, educate him on how to use the machine, how to put the mask on, etc. I have placed an order in the chart. Please send the order, talk to patient, send report to referring MD. We will need a FU in sleep clinic for 10 weeks post-PAP set up, please arrange that with Dr. Vickey Huger or one of our NPs. Also reinforce the need for compliance with treatment. Thanks,   Huston Foley, MD, PhD [facility])

## 2022-04-19 NOTE — Procedures (Signed)
   [facility]  HOME SLEEP TEST (Watch PAT) REPORT  STUDY DATE: 04/16/2022  DOB: Dec 05, 1960  MRN: [id number]  ORDERING CLINICIAN: Huston Foley, MD, PhD - study interpreted on behalf of Dr. Vickey Huger   REFERRING CLINICIAN: Norval Morton, Jake Church, NP (PCP), Dr. Vickey Huger (sleep)  CLINICAL INFORMATION/HISTORY: 61 year old male with an underlying medical history of reflux disease, arthritis, depression, and mild obesity, who reports snoring and witnessed apneas.  Epworth sleepiness score: 10/24.  BMI: 32.8 kg/m  FINDINGS:   Sleep Summary:   Total Recording Time (hours, min): 11 hours, 0 min  Total Sleep Time (hours, min):  9 hours, 35 min  Percent REM (%):    11.5%   Respiratory Indices:   Calculated pAHI (per hour):  24.2/hour         REM pAHI:    37/hour       NREM pAHI: 22.6/hour  Central pAHI: 1.1/hour  Oxygen Saturation Statistics:    Oxygen Saturation (%) Mean: 95%   Minimum oxygen saturation (%):                 84%   O2 Saturation Range (%): 84 - 99%    O2 Saturation (minutes) <=88%: 1.3 min  Pulse Rate Statistics:   Pulse Mean (bpm):    84/min    Pulse Range (59 - 129/min)   IMPRESSION: OSA (obstructive sleep apnea)   RECOMMENDATION:  This home sleep test demonstrates moderate obstructive sleep apnea with a total AHI of 24.2/hour and O2 nadir of 84%.  Intermittent mild to moderate snoring was detected, at times in the louder range. Treatment with a positive airway pressure (PAP) device is recommended. The patient will be advised to proceed with an autoPAP titration/trial at home for now. A full night titration study may be considered to optimize treatment settings, monitor proper oxygen saturations and aid with improvement of tolerance and adherence, if needed down the road. Alternative treatment options may include a dental device through dentistry or orthodontics in selected patients or Inspire (hypoglossal nerve stimulator) in carefully  selected patients (meeting inclusion criteria).  Concomitant weight loss is recommended (where clinically appropriate). Please note that untreated obstructive sleep apnea may carry additional perioperative morbidity. Patients with significant obstructive sleep apnea should receive perioperative PAP therapy and the surgeons and particularly the anesthesiologist should be informed of the diagnosis and the severity of the sleep disordered breathing. The patient should be cautioned not to drive, work at heights, or operate dangerous or heavy equipment when tired or sleepy. Review and reiteration of good sleep hygiene measures should be pursued with any patient. Other causes of the patient's symptoms, including circadian rhythm disturbances, an underlying mood disorder, medication effect and/or an underlying medical problem cannot be ruled out based on this test. Clinical correlation is recommended.  The patient and his referring provider will be notified of the test results. The patient will be seen in follow up in sleep clinic at [facility].  I certify that I have reviewed the raw data recording prior to the issuance of this report in accordance with the standards of the American Academy of Sleep Medicine (AASM).  INTERPRETING PHYSICIAN:   Huston Foley, MD, PhD Medical Director, [facility]) Diplomat, ABPN (Neurology and Sleep)   [facility] [address], [address] [place], [place] [address] [phone]

## 2022-04-22 NOTE — Telephone Encounter (Signed)
Pt has been called and scheduled.

## 2022-04-22 NOTE — Telephone Encounter (Signed)
I called pt. I advised pt that Dr. Frances Furbish reviewed their sleep study results since Dr. Vickey Huger out and found that pt has sleep apnea. Dr. Frances Furbish recommends that pt start CPAP. I reviewed PAP compliance expectations with the pt. Pt is agreeable to starting a CPAP. I advised pt that an order will be sent to a DME, [employer], and [employer] will call the pt within about one to two weeks after they file with the pt's insurance. [employer] will show the pt how to use the machine, fit for masks, and troubleshoot the CPAP if needed. Provided pt [employer] phone# [phone] in case they do not reach out in the next week and he can call. Community message sent to [employer] that order placed.   Aware phone staff will call to schedule initial cpap f/u.

## 2022-04-25 ENCOUNTER — Ambulatory Visit (INDEPENDENT_AMBULATORY_CARE_PROVIDER_SITE_OTHER): Payer: 59 | Admitting: Nurse Practitioner

## 2022-04-25 ENCOUNTER — Encounter: Payer: Self-pay | Admitting: Nurse Practitioner

## 2022-04-25 VITALS — BP 134/84 | HR 70 | Temp 98.1°F | Ht 70.0 in | Wt 241.8 lb

## 2022-04-25 DIAGNOSIS — R209 Unspecified disturbances of skin sensation: Secondary | ICD-10-CM

## 2022-04-25 DIAGNOSIS — G4733 Obstructive sleep apnea (adult) (pediatric): Secondary | ICD-10-CM

## 2022-04-25 DIAGNOSIS — Z0000 Encounter for general adult medical examination without abnormal findings: Secondary | ICD-10-CM

## 2022-04-25 NOTE — Assessment & Plan Note (Signed)
He is still having intermittent cold sensation his right leg with sitting. He is following with neurosurgery and is getting further testing with [facility]. Continue collaboration and recommendations from neurosurgery. Vascular has cleared the patient.

## 2022-04-25 NOTE — Assessment & Plan Note (Signed)
He was recently diagnosed with sleep apnea and is waiting for his CPAP to be delivered.

## 2022-04-25 NOTE — Progress Notes (Signed)
BP 134/84 (BP Location: Left Arm, Cuff Size: Large)   Pulse 70   Temp 98.1 F (36.7 C) (Oral)   Ht 5\' 10"  (1.778 m)   Wt 241 lb 12.8 oz (109.7 kg)   SpO2 97%   BMI 34.69 kg/m    Subjective:    Patient ID: , male    DOB: 1960-07-05, 61 y.o.   MRN: [id number]  CC: Chief Complaint  Patient presents with   Annual Exam    Physical , having   issues with his nerve ,    HPI: Kenneth Calhoun is a 61 y.o. male presenting on 04/25/2022 for comprehensive medical examination. Current medical complaints include: leg numbness and cold temperature  He is still having intermittent leg numbness, cold temperature in his right leg. Now his skin is starting to get shiny and is losing the hair. He has good pulses and ABIs are normal. He went to vascular, who reassured that it is not a vascular or arterial cause of his symptoms. He is going for further testing with [facility] per neurosurgery.   He currently lives with: wife  Depression Screen done today and results listed below:     04/25/2022    9:08 AM 01/24/2022    4:19 PM  Depression screen PHQ 2/9  Decreased Interest 0 0  Down, Depressed, Hopeless 0 3  PHQ - 2 Score 0 3  Altered sleeping  3  Tired, decreased energy  0  Change in appetite  0  Feeling bad or failure about yourself   2  Trouble concentrating  2  Moving slowly or fidgety/restless  2  Suicidal thoughts  0  PHQ-9 Score  12   Past Medical History:  Past Medical History:  Diagnosis Date   Arthritis    Depression 12/2021   The noted that he thinks i have Depression and possibly PTSD from my fall in July.  We are working on August counseling now.   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   amitriptyline (ELAVIL) 50 MG tablet Take 50 mg by mouth at bedtime.   aspirin EC 325 MG tablet Take 1 tablet (325 mg total) by mouth daily. to prevent blood clots after surgery   docusate sodium (COLACE) 100 MG capsule    famotidine (PEPCID AC) 10 MG tablet    gabapentin (NEURONTIN) 300 MG capsule Take 1 capsule (300 mg total) by mouth 3 (three) times daily. Increase gabapentin to twice a day for 7 days, then 3 times a day.   multivitamin (ONE-A-DAY MEN'S) TABS tablet    Tart Cherry 1200 MG CAPS Take 1,000 mg by mouth 2 (two) times daily.   methocarbamol (ROBAXIN) 500 MG tablet Take 1 tablet (500 mg total) by mouth every 8 (eight) hours as needed for muscle spasms. (Patient not taking: Reported on 04/25/2022)   No current facility-administered medications on file  prior to visit.    Allergies:  No Known Allergies  Social History:  Social History   Socioeconomic History   Marital status: Married    Spouse name: Not on file   Number of children: Not on file   Years of education: Not on file   Highest education level: Not on file  Occupational History   Not on file  Tobacco Use   Smoking status: Never   Smokeless tobacco: Never  Vaping Use   Vaping Use: Never used  Substance and Sexual Activity   Alcohol use: No   Drug use: No   Sexual activity: Yes    Birth control/protection: None  Other Topics Concern   Not on file  Social History Narrative   Not on file   Social Determinants of Health   Financial Resource Strain: Not on file  Food Insecurity: Not on file  Transportation Needs: Not on file  Physical Activity: Not on file  Stress: Not  on file  Social Connections: Not on file  Intimate Partner Violence: Not on file   Social History   Tobacco Use  Smoking Status Never  Smokeless Tobacco Never   Social History   Substance and Sexual Activity  Alcohol Use No    Family History:  Family History  Problem Relation Age of Onset   Alcohol abuse Mother    Alcohol abuse Father    Arthritis Sister    Diabetes Sister    Obesity Sister    Arthritis Sister    Depression Sister    Arthritis Brother    Depression Sister    Diabetes Sister    Obesity Sister     Past medical history, surgical history, medications, allergies, family history and social history reviewed with patient today and changes made to appropriate areas of the chart.   Review of Systems  Constitutional:  Positive for malaise/fatigue.  HENT: Negative.    Respiratory: Negative.    Cardiovascular: Negative.   Gastrointestinal: Negative.   Genitourinary: Negative.   Musculoskeletal:  Positive for back pain.  Skin: Negative.   Neurological:        Cold sensation in his legs, losing hair, shiny skin   Psychiatric/Behavioral: Negative.     All other ROS negative except what is listed above and in the HPI.      Objective:    BP 134/84 (BP Location: Left Arm, Cuff Size: Large)   Pulse 70   Temp 98.1 F (36.7 C) (Oral)   Ht 5\' 10"  (1.778 m)   Wt 241 lb 12.8 oz (109.7 kg)   SpO2 97%   BMI 34.69 kg/m   Wt Readings from Last 3 Encounters:  04/25/22 241 lb 12.8 oz (109.7 kg)  04/18/22 243 lb (110.2 kg)  03/18/22 234 lb 9.6 oz (106.4 kg)    Physical Exam Vitals and nursing note reviewed.  Constitutional:      General: He is not in acute distress.    Appearance: Normal appearance.  HENT:     Head: Normocephalic and atraumatic.     Right Ear: Tympanic membrane, ear canal and external ear normal.     Left Ear: Tympanic membrane, ear canal and external ear normal.  Eyes:     Conjunctiva/sclera: Conjunctivae normal.  Cardiovascular:      Rate and Rhythm: Normal rate and regular rhythm.     Pulses: Normal pulses.     Heart sounds: Normal heart sounds.  Pulmonary:     Effort: Pulmonary effort is normal.  Breath sounds: Normal breath sounds.  Abdominal:     Palpations: Abdomen is soft.     Tenderness: There is no abdominal tenderness.  Musculoskeletal:        General: Normal range of motion.     Cervical back: Normal range of motion and neck supple. No tenderness.     Right lower leg: No edema.     Left lower leg: No edema.     Comments: Cool spots on legs  Lymphadenopathy:     Cervical: No cervical adenopathy.  Skin:    General: Skin is warm and dry.  Neurological:     General: No focal deficit present.     Mental Status: He is alert and oriented to person, place, and time.     Cranial Nerves: No cranial nerve deficit.     Coordination: Coordination normal.     Gait: Gait normal.  Psychiatric:        Mood and Affect: Mood normal.        Behavior: Behavior normal.        Thought Content: Thought content normal.        Judgment: Judgment normal.     Results for orders placed or performed in visit on 01/24/22  Cologuard  Result Value Ref Range   COLOGUARD Negative Negative  Comprehensive metabolic panel  Result Value Ref Range   Sodium 139 135 - 145 mEq/L   Potassium 4.1 3.5 - 5.1 mEq/L   Chloride 104 96 - 112 mEq/L   CO2 26 19 - 32 mEq/L   Glucose, Bld 107 (H) 70 - 99 mg/dL   BUN 19 6 - 23 mg/dL   Creatinine, Ser 8.25 0.40 - 1.50 mg/dL   Total Bilirubin 0.5 0.2 - 1.2 mg/dL   Alkaline Phosphatase 68 39 - 117 U/L   AST 18 0 - 37 U/L   ALT 23 0 - 53 U/L   Total Protein 6.6 6.0 - 8.3 g/dL   Albumin 4.0 3.5 - 5.2 g/dL   GFR 05.39 (L) >76.73 mL/min   Calcium 9.0 8.4 - 10.5 mg/dL  CBC  Result Value Ref Range   WBC 7.8 4.0 - 10.5 K/uL   RBC 5.25 4.22 - 5.81 Mil/uL   Platelets 303.0 150.0 - 400.0 K/uL   Hemoglobin 15.1 13.0 - 17.0 g/dL   HCT 41.9 37.9 - 02.4 %   MCV 85.1 78.0 - 100.0 fl   MCHC 33.8  30.0 - 36.0 g/dL   RDW 09.7 35.3 - 29.9 %  Hemoglobin A1c  Result Value Ref Range   Hgb A1c MFr Bld 4.8 4.6 - 6.5 %  HIV Antibody (routine testing w rflx)  Result Value Ref Range   HIV 1&2 Ab, 4th Generation NON-REACTIVE NON-REACTIVE  Hepatitis C antibody  Result Value Ref Range   Hepatitis C Ab NON-REACTIVE NON-REACTIVE  Lipid panel  Result Value Ref Range   Cholesterol 155 0 - 200 mg/dL   Triglycerides 242.6 (H) 0.0 - 149.0 mg/dL   HDL 83.41 (L) >96.22 mg/dL   VLDL 29.7 (H) 0.0 - 98.9 mg/dL   Total CHOL/HDL Ratio 4    NonHDL 118.46   PSA  Result Value Ref Range   PSA 1.37 0.10 - 4.00 ng/mL  LDL cholesterol, direct  Result Value Ref Range   Direct LDL 130.0 mg/dL      Assessment & Plan:   Problem List Items Addressed This Visit       Respiratory   OSA (obstructive sleep apnea)  He was recently diagnosed with sleep apnea and is waiting for his CPAP to be delivered.         Other   Sensation of cold in lower extremity    He is still having intermittent cold sensation his right leg with sitting. He is following with neurosurgery and is getting further testing with [facility]. Continue collaboration and recommendations from neurosurgery. Vascular has cleared the patient.       Other Visit Diagnoses     Routine general medical examination at a health care facility    -  Primary   Health maintenance reviewed and updated. Reviewed recent labs. Discussed nutrition. Follow-up 1 year        IMMUNIZATIONS:   - Tdap: Tetanus vaccination status reviewed: last tetanus booster within 10 years. - Influenza: Refused - Pneumovax: Not applicable - Prevnar: Not applicable - HPV: Not applicable - Zostavax vaccine: Refused  SCREENING: - Colonoscopy: Up to date  Discussed with patient purpose of the colonoscopy is to detect colon cancer at curable precancerous or early stages   - AAA Screening: Not applicable  -Hearing Test: Not applicable  -Spirometry: Not applicable    PATIENT COUNSELING:    Sexuality: Discussed sexually transmitted diseases, partner selection, use of condoms, avoidance of unintended pregnancy  and contraceptive alternatives.   Advised to avoid cigarette smoking.  I discussed with the patient that most people either abstain from alcohol or drink within safe limits (<=14/week and <=4 drinks/occasion for males, <=7/weeks and <= 3 drinks/occasion for females) and that the risk for alcohol disorders and other health effects rises proportionally with the number of drinks per week and how often a drinker exceeds daily limits.  Discussed cessation/primary prevention of drug use and availability of treatment for abuse.   Diet: Encouraged to adjust caloric intake to maintain  or achieve ideal body weight, to reduce intake of dietary saturated fat and total fat, to limit sodium intake by avoiding high sodium foods and not adding table salt, and to maintain adequate dietary potassium and calcium preferably from fresh fruits, vegetables, and low-fat dairy products.    stressed the importance of regular exercise  Injury prevention: Discussed safety belts, safety helmets, smoke detector, smoking near bedding or upholstery.   Dental health: Discussed importance of regular tooth brushing, flossing, and dental visits.   Follow up plan: NEXT PREVENTATIVE PHYSICAL DUE IN 1 YEAR. Return in about 3 months (around 07/25/2022) for nerve pain, leg coolness.

## 2022-04-25 NOTE — Patient Instructions (Signed)
It was great to see you!  Keep following up with neurosurgery and getting further testing.   Let's follow-up in 3 months, sooner if you have concerns.  If a referral was placed today, you will be contacted for an appointment. Please note that routine referrals can sometimes take up to 3-4 weeks to process. Please call our office if you haven't heard anything after this time frame.  Take care,  [name], NP

## 2022-06-04 ENCOUNTER — Encounter: Payer: Self-pay | Admitting: Vascular Surgery

## 2022-06-04 ENCOUNTER — Encounter: Payer: Self-pay | Admitting: Nurse Practitioner

## 2022-06-23 ENCOUNTER — Encounter: Payer: Self-pay | Admitting: Nurse Practitioner

## 2022-06-25 NOTE — Progress Notes (Signed)
   Acute Office Visit  Subjective:     Patient ID: Kenneth Calhoun, male    DOB: [birth year], 62 y.o.   MRN: [id number]  Chief Complaint  Patient presents with   Acute Visit    Dizziness since Saturday    HPI Patient is in today for dizziness since Saturday.  He states that he was leaning over to plug something in when he had a sudden onset of dizziness.  Since then he has been experiencing dizziness with changes in position and turning his head quickly.  He states that he did have some vertigo in the past, however it did not last this long.  He denies ear pain, fevers, headaches.  He denies recent falls.  ROS See pertinent positives and negatives per HPI.     Objective:    BP (!) 152/80 (BP Location: Right Arm, Cuff Size: Large)   Pulse 71   Temp 98.3 F (36.8 C)   Ht 5\' 10"  (1.778 m)   Wt 247 lb (112 kg)   SpO2 96%   BMI 35.44 kg/m    Physical Exam Vitals and nursing note reviewed.  Constitutional:      Appearance: Normal appearance.  HENT:     Head: Normocephalic.     Right Ear: Tympanic membrane, ear canal and external ear normal.     Left Ear: Tympanic membrane, ear canal and external ear normal.  Eyes:     Conjunctiva/sclera: Conjunctivae normal.  Cardiovascular:     Rate and Rhythm: Normal rate and regular rhythm.     Pulses: Normal pulses.     Heart sounds: Normal heart sounds.  Pulmonary:     Effort: Pulmonary effort is normal.     Breath sounds: Normal breath sounds.  Musculoskeletal:     Cervical back: Normal range of motion.  Skin:    General: Skin is warm.  Neurological:     General: No focal deficit present.     Mental Status: He is alert and oriented to person, place, and time.     Comments: Positive dix-hallpike maneuver on right  Psychiatric:        Mood and Affect: Mood normal.        Behavior: Behavior normal.        Thought Content: Thought content normal.        Judgment: Judgment normal.      Assessment & Plan:   Problem List Items  Addressed This Visit       Nervous and Auditory   Benign paroxysmal positional vertigo of right ear - Primary    He has been experiencing vertigo in his right ear for the last 5 days.  Positive Dix-Hallpike maneuver on right side.  Discussed doing Epley maneuvers at home once to twice a day.  Will also give him meclizine 25 mg 3 times daily as needed.  Encouraged him to drink plenty of fluids.  Will have her reach back out in the next couple of days if his symptoms do not improve and we will place referral to vestibular rehab.       Meds ordered this encounter  Medications   meclizine (ANTIVERT) 25 MG tablet    Sig: Take 1 tablet (25 mg total) by mouth 3 (three) times daily as needed for dizziness.    Dispense:  30 tablet    Refill:  0    Return if symptoms worsen or fail to improve.  Gerre Scull, NP

## 2022-06-26 ENCOUNTER — Ambulatory Visit: Payer: 59 | Admitting: Nurse Practitioner

## 2022-06-26 ENCOUNTER — Encounter: Payer: Self-pay | Admitting: Nurse Practitioner

## 2022-06-26 VITALS — BP 152/80 | HR 71 | Temp 98.3°F | Ht 70.0 in | Wt 247.0 lb

## 2022-06-26 DIAGNOSIS — H8111 Benign paroxysmal vertigo, right ear: Secondary | ICD-10-CM | POA: Diagnosis not present

## 2022-06-26 MED ORDER — MECLIZINE HCL 25 MG PO TABS: 25.0000 mg | ORAL_TABLET | Freq: Three times a day (TID) | ORAL | 0 refills | Status: DC | PRN

## 2022-06-26 NOTE — Assessment & Plan Note (Signed)
He has been experiencing vertigo in his right ear for the last 5 days.  Positive Dix-Hallpike maneuver on right side.  Discussed doing Epley maneuvers at home once to twice a day.  Will also give him meclizine 25 mg 3 times daily as needed.  Encouraged him to drink plenty of fluids.  Will have her reach back out in the next couple of days if his symptoms do not improve and we will place referral to vestibular rehab.

## 2022-06-26 NOTE — Patient Instructions (Signed)
It was great to see you!  Start the exercises once to twice a day that I showed you in the office.   You can take meclizine 1 tablet 3 times a day as needed.   Drink plenty of water.   Send me a message if you don't feel better in the next 2 days and I will place a referral to PT.   Let's follow-up If your symptoms worsen or don't improve.   Take care,  Vance Peper, NP

## 2022-06-30 ENCOUNTER — Encounter: Payer: Self-pay | Admitting: Nurse Practitioner

## 2022-06-30 DIAGNOSIS — H8111 Benign paroxysmal vertigo, right ear: Secondary | ICD-10-CM

## 2022-07-03 NOTE — Therapy (Signed)
OUTPATIENT PHYSICAL THERAPY VESTIBULAR EVALUATION     Patient Name: Kenneth Calhoun MRN: [id number] DOB:1961/03/11, 62 y.o., male Today's Date: 07/03/2022  END OF SESSION:   Past Medical History:  Diagnosis Date   Arthritis    Depression 12/2021   The noted that he thinks i have Depression and possibly PTSD from my fall in July.  We are working on [employer] counseling now.   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   Past Surgical History:  Procedure Laterality Date   APPENDECTOMY     FRACTURE SURGERY  12/05/2021   OPEN REDUCTION INTERNAL FIXATION (ORIF) TIBIA/FIBULA FRACTURE Left 12/07/2021   Procedure: OPEN REDUCTION INTERNAL FIXATION (ORIF) PILON FRACTURE;  Surgeon: Shona Needles, MD;  Location: [facility];  Service: Orthopedics;  Laterality: Left;   Patient Active Problem List   Diagnosis Date Noted   Benign paroxysmal positional vertigo of right ear 06/26/2022   OSA (obstructive sleep apnea) 04/25/2022   Chronic right-sided low back pain with right-sided sciatica 03/18/2022   Sensation of cold in lower extremity 03/18/2022   Hypersomnia with sleep apnea 02/27/2022   Sleep-related hypoxia 02/27/2022   At risk for central sleep apnea 02/27/2022   Screening for colon cancer 01/24/2022   Snoring 01/24/2022   IFG (impaired fasting glucose) 01/24/2022   Elevated serum creatinine 01/24/2022   History of fracture of lower leg 01/24/2022   Anxiety [id number]   Umbilical hernia without obstruction and without gangrene 01/24/2022   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  ONSET DATE: 06/22/22  Rationale for Evaluation and Treatment:  Rehabilitation  SUBJECTIVE:   SUBJECTIVE STATEMENT: It has gotten better from what it was last week. I feel it more at night when I lay down. Last week Friday I had shots in my back, on Saturday I bent over to plug something in and got really dizzy. I have gotten it before but never this bad.  Pt accompanied by: significant other  PERTINENT HISTORY:  Patient is in today for dizziness since Saturday (Feb 10th).  He states that he was leaning over to plug something in when he had a sudden onset of dizziness.  Since then he has been experiencing dizziness with changes in position and turning his head quickly.  He states that he did have some vertigo in the past, however it did not last this long.  He denies ear pain, fevers, headaches.  He denies recent falls. Positive Dix-Hallpike maneuver on right side. Discussed doing Epley maneuvers at home once to twice a day. Will also give him meclizine 25 mg 3 times daily as needed   ORIF Tib/Fib fx 12/07/21  DIZZINESS: 5/10   PAIN:  Are you having pain? No   PRECAUTIONS: None  WEIGHT BEARING RESTRICTIONS: No  FALLS: Has patient fallen in last 6 months? Yes. Number of falls 1  LIVING ENVIRONMENT: Lives with: lives with their family Lives in: House/apartment Stairs: Yes: Internal: 15 steps; on right going up and External: 6 steps; on right going up Has following equipment at home: None  PLOF: Independent  PATIENT GOALS: get rid of dizziness   OBJECTIVE:   COGNITION: Overall cognitive status: Within functional limits for tasks  assessed   SENSATION: WFL  POSTURE:  rounded shoulders  Cervical ROM:    Active A/PROM (deg) eval  Flexion WFL  Extension Mod limitations  Right lateral flexion Mild tightness  Left lateral flexion Mild tightness  Right rotation WFL  Left rotation WFL  (Blank rows = not tested)  PATIENT SURVEYS:  FOTO 55  VESTIBULAR ASSESSMENT:   SYMPTOM BEHAVIOR:  Non-Vestibular symptoms: tinnitus  Type of  dizziness: Spinning/Vertigo and "Funny feeling in the head"  Frequency: every day  Duration: 1 min or less  Aggravating factors: Induced by position change: rolling to the right, rolling to the left, and supine to sit and Induced by motion: looking up at the ceiling and turning head quickly  Relieving factors: head stationary, rest, and slow movements  Progression of symptoms: better  OCULOMOTOR EXAM:  Ocular Alignment: normal  Ocular ROM: No Limitations  Spontaneous Nystagmus: absent  Gaze-Induced Nystagmus: right beating with right gaze  Smooth Pursuits: intact  Saccades: intact  VESTIBULAR - OCULAR REFLEX:   Slow VOR: Comment: dizziness with vertical   VOR Cancellation: Normal  POSITIONAL TESTING: Right Dix-Hallpike: no nystagmus   VESTIBULAR TREATMENT:                                                                                                   DATE:   Canalith Repositioning:  Epley Right: Number of Reps: 1 and Response to Treatment: symptoms improved   PATIENT EDUCATION: Education details: POC and HEP Person educated: Patient Education method: Explanation Education comprehension: verbalized understanding  HOME EXERCISE PROGRAM:  Access Code: [email or web address] URL: [email or web address] Date: 07/04/2022 Prepared by: Andris Baumann  Exercises - Self-Epley Maneuver Right Ear  - 2 x daily - 7 x weekly - Brandt-Daroff Vestibular Exercise  - 2 x daily - 7 x weekly - 5 reps - Seated Gaze Stabilization with Head Nod  - 1 x daily - 7 x weekly - 2 sets - 10 reps - Seated Gaze Stabilization with Head Rotation  - 1 x daily - 7 x weekly - 2 sets - 10 reps  GOALS: Goals reviewed with patient? Yes  SHORT TERM GOALS: Target date: 07/25/22  Patient will be independent with initial HEP.  Goal status: INITIAL   LONG TERM GOALS: Target date: 08/15/22  Patient will be independent with advanced/ongoing HEP to improve outcomes and carryover.  Goal status: INITIAL  2.   Patient will report 75% improvement in dizziness.  Baseline: 5/10 Goal status: INITIAL  3.  Patient will report 11 on FOTO to demonstrate improved functional ability.  Baseline: 55 Goal status: INITIAL  4.  Patient will demonstrate quick head movements and turning over in bed without dizziness Goal status: INITIAL   ASSESSMENT:  CLINICAL IMPRESSION: Patient is a 62 y.o. male who was seen today for physical therapy evaluation and treatment for BPPV. He states it started last Saturday when bending over to plug something in. When he initially went to the doctor most of his symptoms presented with R sided Epley. He states it has gotten better but he continues to have lingering effects of the  BPPV. Did an Epley maneuver for R side and he did not have any symptoms except slight dizziness when head turned to the L side. Was advised on doing self Epley's at home 2x a day and trying Brandt-Daroff exercises and VOR gaze stabilization. Patient will benefit from skilled PT to address his BPPV symptoms and decrease his dizziness to return to PLOF.   OBJECTIVE IMPAIRMENTS: dizziness.   REHAB POTENTIAL: Good  CLINICAL DECISION MAKING: Stable/uncomplicated  EVALUATION COMPLEXITY: Low   PLAN:  PT FREQUENCY: 1x/week  PT DURATION: 6 weeks  PLANNED INTERVENTIONS: Therapeutic exercises, Therapeutic activity, Neuromuscular re-education, Balance training, Gait training, Patient/Family education, Self Care, Joint mobilization, Vestibular training, Canalith repositioning, Dry Needling, Moist heat, Traction, and Manual therapy  PLAN FOR NEXT SESSION: vestibular reassessment, canalith repositioning, VOR exercises   Andris Baumann, PT 07/03/2022, 3:37 PM

## 2022-07-04 ENCOUNTER — Ambulatory Visit: Payer: 59 | Attending: Nurse Practitioner

## 2022-07-04 DIAGNOSIS — H8111 Benign paroxysmal vertigo, right ear: Secondary | ICD-10-CM

## 2022-07-04 DIAGNOSIS — R42 Dizziness and giddiness: Secondary | ICD-10-CM | POA: Insufficient documentation

## 2022-07-09 ENCOUNTER — Other Ambulatory Visit: Payer: Self-pay | Admitting: Nurse Practitioner

## 2022-07-10 MED ORDER — MECLIZINE HCL 25 MG PO TABS
25.0000 mg | ORAL_TABLET | Freq: Three times a day (TID) | ORAL | 0 refills | Status: DC | PRN
Start: 2022-07-10 — End: 2022-07-24

## 2022-07-10 NOTE — Telephone Encounter (Signed)
Message left with patient's wife that Rx sent to pharmacy.

## 2022-07-16 ENCOUNTER — Ambulatory Visit: Payer: 59

## 2022-07-16 NOTE — Therapy (Signed)
OUTPATIENT PHYSICAL THERAPY VESTIBULAR TREATMENT     Patient Name: Kenneth Calhoun MRN: [id number] DOB:15-Sep-1960, 62 y.o., male Today's Date: 07/17/2022  END OF SESSION:  PT End of Session - 07/17/22 1016     Visit Number 2    Date for PT Re-Evaluation 08/15/22    Authorization Type [employer]    PT Start Time [id number]    PT Stop Time 1100    PT Time Calculation (min) 45 min    Activity Tolerance Patient tolerated treatment well             Past Medical History:  Diagnosis Date   Arthritis    Depression 12/2021   The noted that he thinks i have Depression and possibly PTSD from my fall in July.  We are working on [employer] counseling now.   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   Past Surgical History:  Procedure Laterality Date   APPENDECTOMY     FRACTURE SURGERY  12/05/2021   OPEN REDUCTION INTERNAL FIXATION (ORIF) TIBIA/FIBULA FRACTURE Left 12/07/2021   Procedure: OPEN REDUCTION INTERNAL FIXATION (ORIF) PILON FRACTURE;  Surgeon: Shona Needles, MD;  Location: [facility];  Service: Orthopedics;  Laterality: Left;   Patient Active Problem List   Diagnosis Date Noted   Benign paroxysmal positional vertigo of right ear 06/26/2022   OSA (obstructive sleep apnea) 04/25/2022   Chronic right-sided low back pain with right-sided sciatica 03/18/2022   Sensation of cold in lower extremity 03/18/2022   Hypersomnia with sleep apnea 02/27/2022   Sleep-related hypoxia 02/27/2022   At risk for central sleep apnea 02/27/2022   Screening for colon cancer 01/24/2022   Snoring 01/24/2022   IFG (impaired fasting glucose) 01/24/2022   Elevated serum creatinine 01/24/2022   History of fracture of lower leg 01/24/2022   Anxiety [id number]   Umbilical hernia without obstruction and without gangrene 01/24/2022   Urethral meatal stenosis 12/10/2021   Acute urinary retention 12/10/2021   Open displaced pilon fracture of left tibia  12/05/2021    PCP: Vance Peper REFERRING PROVIDER: Vance Peper  REFERRING DIAG:  H81.11 (ICD-10-CM) - Benign paroxysmal positional vertigo of right ear    THERAPY DIAG:  BPPV (benign paroxysmal positional vertigo), right  Dizziness and giddiness  ONSET DATE: 06/22/22  Rationale for Evaluation and Treatment: Rehabilitation  SUBJECTIVE:   SUBJECTIVE STATEMENT: Dizziness is still the same. I tried the Brandt-Daroff exercise one time and I pretty much blacked out and was in the recliner the rest of the day.  Pt accompanied by: significant other  PERTINENT HISTORY:  Patient is in today for dizziness since Saturday (Feb 10th).  He states that he was leaning over to plug something in when he had a sudden onset of dizziness.  Since then he has been experiencing dizziness with changes in position and turning his head quickly.  He states that he did have some vertigo in the past, however it did not last this long.  He denies ear pain, fevers, headaches.  He denies recent falls. Positive Dix-Hallpike maneuver on right side. Discussed doing Epley maneuvers at home once to twice a day. Will also give him meclizine 25 mg 3 times daily as needed   ORIF Tib/Fib fx 12/07/21  DIZZINESS: 5/10   PAIN:  Are you having pain? No   PRECAUTIONS: None  WEIGHT BEARING RESTRICTIONS: No  FALLS: Has patient fallen in last 6 months? Yes. Number of falls 1  LIVING ENVIRONMENT:  Lives with: lives with their family Lives in: House/apartment Stairs: Yes: Internal: 15 steps; on right going up and External: 6 steps; on right going up Has following equipment at home: None  PLOF: Independent  PATIENT GOALS: get rid of dizziness   OBJECTIVE:   COGNITION: Overall cognitive status: Within functional limits for tasks assessed   SENSATION: WFL  POSTURE:  rounded shoulders  Cervical ROM:    Active A/PROM (deg) eval  Flexion WFL  Extension Mod limitations  Right lateral flexion Mild  tightness  Left lateral flexion Mild tightness  Right rotation WFL  Left rotation WFL  (Blank rows = not tested)  PATIENT SURVEYS:  FOTO 55  VESTIBULAR ASSESSMENT:   SYMPTOM BEHAVIOR:  Non-Vestibular symptoms: tinnitus  Type of dizziness: Spinning/Vertigo and "Funny feeling in the head"  Frequency: every day  Duration: 1 min or less  Aggravating factors: Induced by position change: rolling to the right, rolling to the left, and supine to sit and Induced by motion: looking up at the ceiling and turning head quickly  Relieving factors: head stationary, rest, and slow movements  Progression of symptoms: better  OCULOMOTOR EXAM:  Ocular Alignment: normal  Ocular ROM: No Limitations  Spontaneous Nystagmus: absent  Gaze-Induced Nystagmus: right beating with right gaze  Smooth Pursuits: intact  Saccades: intact  VESTIBULAR - OCULAR REFLEX:   Slow VOR: Comment: dizziness with vertical   VOR Cancellation: Normal  POSITIONAL TESTING: Right Dix-Hallpike: no nystagmus   VESTIBULAR TREATMENT:                                                                                                   DATE:  07/17/22 Reassess dizziness  Brandt-Daroff x3 VOR x1 and x2 Standing on airex feet together then [facility] Head turns and nods on airex  Walking with head turns  07/04/22 Canalith Repositioning:  Epley Right: Number of Reps: 1 and Response to Treatment: symptoms improved   PATIENT EDUCATION: Education details: POC and HEP Person educated: Patient Education method: Explanation Education comprehension: verbalized understanding  HOME EXERCISE PROGRAM:  Access Code: [email or web address] URL: [email or web address] Date: 07/04/2022 Prepared by: Andris Baumann  Exercises - Self-Epley Maneuver Right Ear  - 2 x daily - 7 x weekly - Brandt-Daroff Vestibular Exercise  - 2 x daily - 7 x weekly - 5 reps - Seated Gaze Stabilization with Head Nod  - 1 x daily - 7 x weekly - 2 sets - 10 reps -  Seated Gaze Stabilization with Head Rotation  - 1 x daily - 7 x weekly - 2 sets - 10 reps  GOALS: Goals reviewed with patient? Yes  SHORT TERM GOALS: Target date: 07/25/22  Patient will be independent with initial HEP.  Goal status: INITIAL   LONG TERM GOALS: Target date: 08/15/22  Patient will be independent with advanced/ongoing HEP to improve outcomes and carryover.  Goal status: INITIAL  2.  Patient will report 75% improvement in dizziness.  Baseline: 5/10 Goal status: INITIAL  3.  Patient will report 20 on FOTO to demonstrate improved functional ability.  Baseline: 55 Goal status: INITIAL  4.  Patient will demonstrate quick head movements and turning over in bed without dizziness Goal status: INITIAL   ASSESSMENT:  CLINICAL IMPRESSION: We tried Brandt-Daroff exercises here, he gets dizzy and spinning when going down to the R side but it subsides after about a minute or so. Tried I 2 more times and each time his symptoms improved. Some blurred vision with VOR exercises but no dizziness. Increased sway with eyes closed on airex. Some dizziness with head turns and walking  OBJECTIVE IMPAIRMENTS: dizziness.   REHAB POTENTIAL: Good  CLINICAL DECISION MAKING: Stable/uncomplicated  EVALUATION COMPLEXITY: Low   PLAN:  PT FREQUENCY: 1x/week  PT DURATION: 6 weeks  PLANNED INTERVENTIONS: Therapeutic exercises, Therapeutic activity, Neuromuscular re-education, Balance training, Gait training, Patient/Family education, Self Care, Joint mobilization, Vestibular training, Canalith repositioning, Dry Needling, Moist heat, Traction, and Manual therapy  PLAN FOR NEXT SESSION: vestibular reassessment, canalith repositioning, VOR exercises   Andris Baumann, PT 07/17/2022, 11:01 AM

## 2022-07-17 ENCOUNTER — Ambulatory Visit: Payer: 59 | Attending: Nurse Practitioner

## 2022-07-17 DIAGNOSIS — R42 Dizziness and giddiness: Secondary | ICD-10-CM | POA: Insufficient documentation

## 2022-07-17 DIAGNOSIS — H8111 Benign paroxysmal vertigo, right ear: Secondary | ICD-10-CM | POA: Insufficient documentation

## 2022-07-22 NOTE — Therapy (Signed)
OUTPATIENT PHYSICAL THERAPY VESTIBULAR TREATMENT     Patient Name: Kenneth Calhoun MRN: [id number] DOB:[birth year], 62 y.o., male Today's Date: 07/23/2022  END OF SESSION:  PT End of Session - 07/23/22 1138     Visit Number 3    Date for PT Re-Evaluation 08/15/22    Authorization Type [employer]    PT Start Time 1136    PT Stop Time 1220    PT Time Calculation (min) 44 min    Activity Tolerance Patient tolerated treatment well              Past Medical History:  Diagnosis Date   Arthritis    Depression 12/2021   The noted that he thinks i have Depression and possibly PTSD from my fall in July.  We are working on [employer] counseling now.   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   Past Surgical History:  Procedure Laterality Date   APPENDECTOMY     FRACTURE SURGERY  12/05/2021   OPEN REDUCTION INTERNAL FIXATION (ORIF) TIBIA/FIBULA FRACTURE Left 12/07/2021   Procedure: OPEN REDUCTION INTERNAL FIXATION (ORIF) PILON FRACTURE;  Surgeon: Shona Needles, MD;  Location: [facility];  Service: Orthopedics;  Laterality: Left;   Patient Active Problem List   Diagnosis Date Noted   Benign paroxysmal positional vertigo of right ear 06/26/2022   OSA (obstructive sleep apnea) 04/25/2022   Chronic right-sided low back pain with right-sided sciatica 03/18/2022   Sensation of cold in lower extremity 03/18/2022   Hypersomnia with sleep apnea 02/27/2022   Sleep-related hypoxia 02/27/2022   At risk for central sleep apnea 02/27/2022   Screening for colon cancer 01/24/2022   Snoring 01/24/2022   IFG (impaired fasting glucose) 01/24/2022   Elevated serum creatinine 01/24/2022   History of fracture of lower leg 01/24/2022   Anxiety [id number]   Umbilical hernia without obstruction and without gangrene 01/24/2022   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 It is a process, I definitely still have my moments.   Pt accompanied by: significant other  PERTINENT HISTORY:  Patient is in today for dizziness since Saturday (Feb 10th).  He states that he was leaning over to plug something in when he had a sudden onset of dizziness.  Since then he has been experiencing dizziness with changes in position and turning his head quickly.  He states that he did have some vertigo in the past, however it did not last this long.  He denies ear pain, fevers, headaches.  He denies recent falls. Positive Dix-Hallpike maneuver on right side. Discussed doing Epley maneuvers at home once to twice a day. Will also give him meclizine 25 mg 3 times daily as needed   ORIF Tib/Fib fx 12/07/21  DIZZINESS: 5/10   PAIN:  Are you having pain? No   PRECAUTIONS: None  WEIGHT BEARING RESTRICTIONS: No  FALLS: Has patient fallen in last 6 months? Yes. Number of falls 1  LIVING ENVIRONMENT: Lives with: lives with their  family Lives in: House/apartment Stairs: Yes: Internal: 15 steps; on right going up and External: 6 steps; on right going up Has following equipment at home: None  PLOF: Independent  PATIENT GOALS: get rid of dizziness   OBJECTIVE:   COGNITION: Overall cognitive status: Within functional limits for tasks assessed   SENSATION: WFL  POSTURE:  rounded shoulders  Cervical ROM:    Active A/PROM (deg) eval  Flexion WFL  Extension Mod limitations  Right lateral flexion Mild tightness  Left lateral flexion Mild tightness   Right rotation WFL  Left rotation WFL  (Blank rows = not tested)  PATIENT SURVEYS:  FOTO 55  VESTIBULAR ASSESSMENT:   SYMPTOM BEHAVIOR:  Non-Vestibular symptoms: tinnitus  Type of dizziness: Spinning/Vertigo and "Funny feeling in the head"  Frequency: every day  Duration: 1 min or less  Aggravating factors: Induced by position change: rolling to the right, rolling to the left, and supine to sit and Induced by motion: looking up at the ceiling and turning head quickly  Relieving factors: head stationary, rest, and slow movements  Progression of symptoms: better  OCULOMOTOR EXAM:  Ocular Alignment: normal  Ocular ROM: No Limitations  Spontaneous Nystagmus: absent  Gaze-Induced Nystagmus: right beating with right gaze  Smooth Pursuits: intact  Saccades: intact  VESTIBULAR - OCULAR REFLEX:   Slow VOR: Comment: dizziness with vertical   VOR Cancellation: Normal  POSITIONAL TESTING: Right Dix-Hallpike: no nystagmus   VESTIBULAR TREATMENT:                                                                                                   DATE:  07/23/22 Redo Epley R side- no sx, only slight dizziness when sitting back up  L dix-hallpike- clear  BBQ for R side Standing on airex head turns  Standing on airex head turns looking at target  EC on airex 30s  EC with head turns and nods on airex 20 reps  Walking with head turns, then looking at target, and walking backwards Picking up cones standing on airex Walking with direction changes catching ball     07/17/22 Reassess dizziness  Brandt-Daroff x3 VOR x1 and x2 Standing on airex feet together then [facility] Head turns and nods on airex  Walking with head turns  07/04/22 Canalith Repositioning:  Epley Right: Number of Reps: 1 and Response to Treatment: symptoms improved   PATIENT EDUCATION: Education details: POC and HEP Person educated: Patient Education method: Explanation Education comprehension: verbalized  understanding  HOME EXERCISE PROGRAM:  Access Code: [email or web address] URL: [email or web address] Date: 07/04/2022 Prepared by: Andris Baumann  Exercises - Self-Epley Maneuver Right Ear  - 2 x daily - 7 x weekly - Brandt-Daroff Vestibular Exercise  - 2 x daily - 7 x weekly - 5 reps - Seated Gaze Stabilization with Head Nod  - 1 x daily - 7 x weekly - 2 sets - 10 reps - Seated Gaze Stabilization with Head Rotation  - 1 x daily - 7 x weekly - 2 sets - 10 reps  GOALS: Goals reviewed with patient? Yes  SHORT TERM GOALS: Target date:  07/25/22  Patient will be independent with initial HEP.  Goal status: INITIAL   LONG TERM GOALS: Target date: 08/15/22  Patient will be independent with advanced/ongoing HEP to improve outcomes and carryover.  Goal status: INITIAL  2.  Patient will report 75% improvement in dizziness.  Baseline: 5/10 Goal status: INITIAL  3.  Patient will report 55 on FOTO to demonstrate improved functional ability.  Baseline: 55 Goal status: INITIAL  4.  Patient will demonstrate quick head movements and turning over in bed without dizziness Goal status: INITIAL   ASSESSMENT:  CLINICAL IMPRESSION: Patient continues to have some episodes if dizzinesss and states it is more in the R side that he feels it. I tried doing another Epley on the R side but very minimal symptoms of dizziness when sitting up at end of maneuver. Then tested L side canal just be sure-- it was clear. I then tested horizontal canals and he gets dizzy when looking to the R, so decided to do BBQ roll. No provocation of symptoms with this either. We shifted focus to doing more balance and VOR exercises. He has increased sway on airex especially with eyes closed and head nods- needs minA to prevent falling forward.  Does need a break from standing on airex due to L ankle.   OBJECTIVE IMPAIRMENTS: dizziness.   REHAB POTENTIAL: Good  CLINICAL DECISION MAKING: Stable/uncomplicated  EVALUATION  COMPLEXITY: Low   PLAN:  PT FREQUENCY: 1x/week  PT DURATION: 6 weeks  PLANNED INTERVENTIONS: Therapeutic exercises, Therapeutic activity, Neuromuscular re-education, Balance training, Gait training, Patient/Family education, Self Care, Joint mobilization, Vestibular training, Canalith repositioning, Dry Needling, Moist heat, Traction, and Manual therapy  PLAN FOR NEXT SESSION: vestibular reassessment, canalith repositioning, VOR exercises   Andris Baumann, PT 07/23/2022, 12:17 PM

## 2022-07-23 ENCOUNTER — Ambulatory Visit: Payer: 59

## 2022-07-23 DIAGNOSIS — R42 Dizziness and giddiness: Secondary | ICD-10-CM

## 2022-07-23 DIAGNOSIS — H8111 Benign paroxysmal vertigo, right ear: Secondary | ICD-10-CM

## 2022-07-24 ENCOUNTER — Encounter: Payer: Self-pay | Admitting: Nurse Practitioner

## 2022-07-24 ENCOUNTER — Ambulatory Visit: Payer: 59 | Admitting: Nurse Practitioner

## 2022-07-24 VITALS — BP 136/80 | HR 73 | Temp 98.0°F | Ht 70.0 in | Wt 250.8 lb

## 2022-07-24 DIAGNOSIS — H8111 Benign paroxysmal vertigo, right ear: Secondary | ICD-10-CM

## 2022-07-24 DIAGNOSIS — M5441 Lumbago with sciatica, right side: Secondary | ICD-10-CM

## 2022-07-24 DIAGNOSIS — E669 Obesity, unspecified: Secondary | ICD-10-CM | POA: Diagnosis not present

## 2022-07-24 DIAGNOSIS — G8929 Other chronic pain: Secondary | ICD-10-CM

## 2022-07-24 DIAGNOSIS — L918 Other hypertrophic disorders of the skin: Secondary | ICD-10-CM | POA: Diagnosis not present

## 2022-07-24 MED ORDER — MECLIZINE HCL 25 MG PO TABS: 25.0000 mg | ORAL_TABLET | Freq: Three times a day (TID) | ORAL | 1 refills | Status: DC | PRN

## 2022-07-24 MED ORDER — PREGABALIN 75 MG PO CAPS: 75.0000 mg | ORAL_CAPSULE | Freq: Every day | ORAL | 1 refills | Status: DC

## 2022-07-24 MED ORDER — ZEPBOUND 2.5 MG/0.5ML ~~LOC~~ SOAJ: 2.5000 mg | SUBCUTANEOUS | 1 refills | Status: DC

## 2022-07-24 NOTE — Patient Instructions (Addendum)
It was great to see you!  Start zepbound once a week  Stop the gabapentin and start lyrica 1 capsule daily  Let's follow-up in 8 weeks, sooner if you have concerns.  If a referral was placed today, you will be contacted for an appointment. Please note that routine referrals can sometimes take up to 3-4 weeks to process. Please call our office if you haven't heard anything after this time frame.  Take care,  Vance Peper, NP

## 2022-07-24 NOTE — Progress Notes (Signed)
Established Patient Office Visit  Subjective   Patient ID: Kenneth Calhoun, male    DOB: Jun 19, 1960  Age: 62 y.o. MRN: [id number]  Chief Complaint  Patient presents with   Dizziness    Still there, concerns with weight    HPI  Kenneth Calhoun is here to follow-up on dizziness and is having concerns with weight gain.   He states that his dizziness has gotten slightly better.  He is going to the physical therapy for this.  He has also been taking meclizine 25 mg as needed which has helped some.  He has been trying not to change positions too quickly.  He is also concerned with his weight gain.  He has gained 10 pounds in the last 6 months, however he has gained slightly more before he established here.  This all happened after his fall and he was not able to exercise as much and started eating more.  Recently, he has been trying to limit his snacking and start walking around his yard.  He has been going to physical therapy as well.  He is interested in medication to help with weight loss.  He also has several skin tags that he would like removed, 2 of them are on his face and one is on his inner thigh.    ROS See pertinent positives and negatives per HPI.    Objective:     BP 136/80 (BP Location: Right Arm, Cuff Size: Large)   Pulse 73   Temp 98 F (36.7 C)   Ht 5\' 10"  (1.778 m)   Wt 250 lb 12.8 oz (113.8 kg)   SpO2 96%   BMI 35.99 kg/m    Physical Exam Vitals and nursing note reviewed.  Constitutional:      Appearance: Normal appearance. He is obese.  HENT:     Head: Normocephalic.  Eyes:     Conjunctiva/sclera: Conjunctivae normal.  Cardiovascular:     Rate and Rhythm: Normal rate and regular rhythm.     Pulses: Normal pulses.     Heart sounds: Normal heart sounds.  Pulmonary:     Effort: Pulmonary effort is normal.     Breath sounds: Normal breath sounds.  Musculoskeletal:     Cervical back: Normal range of motion.  Skin:    General: Skin is warm.   Neurological:     General: No focal deficit present.     Mental Status: He is alert and oriented to person, place, and time.  Psychiatric:        Mood and Affect: Mood normal.        Behavior: Behavior normal.        Thought Content: Thought content normal.        Judgment: Judgment normal.    The 10-year ASCVD risk score (Arnett DK, et al., 2019) is: 12.2%    Assessment & Plan:   Problem List Items Addressed This Visit       Nervous and Auditory   Chronic right-sided low back pain with right-sided sciatica    He is having ongoing neuropathy and the injections provided minimal relief.  Will have him stop gabapentin and start Lyrica 75 mg daily.  PDMP reviewed.  Follow-up in 6 to 8 weeks.      Relevant Medications   pregabalin (LYRICA) 75 MG capsule   Benign paroxysmal positional vertigo of right ear    His symptoms are improving, still however not fully controlled.  Continue with vestibular rehab and taking  meclizine 25 mg 3 times daily as needed.  Encourage fluids.        Other   Obesity (BMI 30-39.9)    BMI 35.  He has gained 10 pounds since the last 6 months.  He has been limiting his snacking and is going to start trying walking around the yard more.  He is having vertigo and recovering from his fall with left tibia fracture.  With history of elevated blood pressure and sleep apnea, we will start him on Zepbound 2.5 mg injection weekly.  Discussed possible side effects.  Continue with nutrition planning and increasing exercise slowly.  Follow-up in 6 to 8 weeks.      Other Visit Diagnoses     Skin tag    -  Primary   He has multiple skin tags and would like them removed.  Referral placed to dermatology.   Relevant Orders   Ambulatory referral to Dermatology       Return in about 2 months (around 09/23/2022) for weight.    Charyl Dancer, NP

## 2022-07-24 NOTE — Progress Notes (Unsigned)
[facility] [address] [place]. [place] [address] [phone]       OFFICE FOLLOW UP NOTE  Mr. Sahand Sposito Muecke Date of Birth:  09/30/1960 Medical Record Number:  [id number]   Reason for visit: Initial CPAP follow-up    SUBJECTIVE:   CHIEF COMPLAINT:  No chief complaint on file.  Follow-up visit:  Prior visit: 02/27/2022 with Dr. Brett Fairy (initial consult visit)   Brief HPI:   HAYDIN DIBARTOLO is a 62 y.o. male who is being seen for initial compliance visit.  Completed HST 04/16/2022 which showed moderate OSA with total AHI of 24.2/h and O2 nadir of 84%.  Recommend initiation of AutoPap.  Set up date 05/03/2022.  Interval history:  Initial 30-day compliance report showed satisfactory usage and optimal residual AHI.  Past 30 days slightly lower compliance ***   Epworth Sleepiness Scale ***/24 (prior to CPAP 10/24)           ROS:   14 system review of systems performed and negative with exception of those listed in HPI  PMH:  Past Medical History:  Diagnosis Date   Arthritis    Depression 12/2021   The noted that he thinks i have Depression and possibly PTSD from my fall in July.  We are working on [employer] counseling now.   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    PSH:  Past Surgical History:  Procedure Laterality Date   APPENDECTOMY     FRACTURE SURGERY  12/05/2021   OPEN REDUCTION INTERNAL FIXATION (ORIF) TIBIA/FIBULA FRACTURE Left 12/07/2021   Procedure: OPEN REDUCTION INTERNAL FIXATION (ORIF) PILON FRACTURE;  Surgeon: Shona Needles, MD;  Location: [facility];  Service: Orthopedics;  Laterality: Left;    Social History:  Social History   Socioeconomic History   Marital status: Married    Spouse name: Not on file   Number of children: Not on file   Years of education: Not on file   Highest education level: Not on file  Occupational History   Not on file  Tobacco Use    Smoking status: Never   Smokeless tobacco: Never  Vaping Use   Vaping Use: Never used  Substance and Sexual Activity   Alcohol use: No   Drug use: No   Sexual activity: Yes    Birth control/protection: None  Other Topics Concern   Not on file  Social History Narrative   Not on file   Social Determinants of Health   Financial Resource Strain: Not on file  Food Insecurity: Not on file  Transportation Needs: Not on file  Physical Activity: Not on file  Stress: Not on file  Social Connections: Not on file  Intimate Partner Violence: Not on file    Family History:  Family History  Problem Relation Age of Onset   Alcohol abuse Mother    Alcohol abuse Father    Arthritis Sister    Diabetes Sister    Obesity Sister    Arthritis Sister    Depression Sister    Arthritis Brother    Depression Sister    Diabetes Sister    Obesity Sister     Medications:   Current Outpatient Medications on File Prior to Visit  Medication Sig Dispense Refill   acetaminophen (TYLENOL) 500 MG tablet Take 2 tablets (1,000 mg total) by mouth every 8 (eight) hours. 30 tablet 0   aspirin EC 325 MG tablet Take 1 tablet (325 mg  total) by mouth daily. to prevent blood clots after surgery (Patient not taking: Reported on 07/24/2022) 30 tablet 0   famotidine (PEPCID AC) 10 MG tablet      meclizine (ANTIVERT) 25 MG tablet Take 1 tablet (25 mg total) by mouth 3 (three) times daily as needed for dizziness. 90 tablet 1   methocarbamol (ROBAXIN) 500 MG tablet Take 1 tablet (500 mg total) by mouth every 8 (eight) hours as needed for muscle spasms. (Patient not taking: Reported on 04/25/2022) 20 tablet 0   multivitamin (ONE-A-DAY MEN'S) TABS tablet      pregabalin (LYRICA) 75 MG capsule Take 1 capsule (75 mg total) by mouth daily. 30 capsule 1   Tart Cherry 1200 MG CAPS Take 1,000 mg by mouth 2 (two) times daily.     tirzepatide (ZEPBOUND) 2.5 MG/0.5ML Pen Inject 2.5 mg into the skin once a week. 2 mL 1   No  current facility-administered medications on file prior to visit.    Allergies:  No Known Allergies    OBJECTIVE:  Physical Exam  There were no vitals filed for this visit. There is no height or weight on file to calculate BMI. No results found.   General: well developed, well nourished, seated, in no evident distress Head: head normocephalic and atraumatic.   Neck: supple with no carotid or supraclavicular bruits Cardiovascular: regular rate and rhythm, no murmurs Musculoskeletal: no deformity Skin:  no rash/petichiae Vascular:  Normal pulses all extremities   Neurologic Exam Mental Status: Awake and fully alert. Oriented to place and time. Recent and remote memory intact. Attention span, concentration and fund of knowledge appropriate. Mood and affect appropriate.  Cranial Nerves: Pupils equal, briskly reactive to light. Extraocular movements full without nystagmus. Visual fields full to confrontation. Hearing intact. Facial sensation intact. Face, tongue, palate moves normally and symmetrically.  Motor: Normal bulk and tone. Normal strength in all tested extremity muscles Sensory.: intact to touch , pinprick , position and vibratory sensation.  Coordination: Rapid alternating movements normal in all extremities. Finger-to-nose and heel-to-shin performed accurately bilaterally. Gait and Station: Arises from chair without difficulty. Stance is normal. Gait demonstrates normal stride length and balance without use of AD. Tandem walk and heel toe without difficulty.  Reflexes: 1+ and symmetric. Toes downgoing.         ASSESSMENT/PLAN: JASKARN SUIT is a 62 y.o. year old male    OSA on CPAP : Compliance report shows satisfactory usage with optimal residual AHI.  Discussed continued nightly usage with ensuring greater than 4 hours nightly for optimal benefit and per insurance purposes.  Continue to follow with DME company for any needed supplies or CPAP related  concerns     Follow up in *** or call earlier if needed   CC:  PCP: McElwee, Lauren A, NP    I spent *** minutes of face-to-face and non-face-to-face time with patient.  This included previsit chart review, lab review, study review, order entry, electronic health record documentation, patient education regarding diagnosis of sleep apnea with review and discussion of compliance report and answered all other questions to patient's satisfaction   Frann Rider, [facility]  [facility] [address] [place], [place] [address]  Phone [phone] Fax [phone] Note: This document was prepared with digital dictation and possible smart phrase technology. Any transcriptional errors that result from this process are unintentional.

## 2022-07-25 ENCOUNTER — Encounter: Payer: Self-pay | Admitting: Adult Health

## 2022-07-25 ENCOUNTER — Telehealth: Payer: Self-pay | Admitting: Nurse Practitioner

## 2022-07-25 ENCOUNTER — Ambulatory Visit (INDEPENDENT_AMBULATORY_CARE_PROVIDER_SITE_OTHER): Payer: 59 | Admitting: Adult Health

## 2022-07-25 VITALS — BP 150/78 | HR 72 | Ht 70.0 in | Wt 245.0 lb

## 2022-07-25 DIAGNOSIS — G4733 Obstructive sleep apnea (adult) (pediatric): Secondary | ICD-10-CM | POA: Diagnosis not present

## 2022-07-25 DIAGNOSIS — E669 Obesity, unspecified: Secondary | ICD-10-CM | POA: Insufficient documentation

## 2022-07-25 MED ORDER — ZEPBOUND 2.5 MG/0.5ML ~~LOC~~ SOAJ: 2.5000 mg | SUBCUTANEOUS | 1 refills | Status: DC

## 2022-07-25 NOTE — Telephone Encounter (Signed)
I called [facility] and gave them ICD 10 Code E66.9

## 2022-07-25 NOTE — Telephone Encounter (Signed)
Dean [phone]   [facility] needs an ICD 10 code for his zepbound

## 2022-07-26 ENCOUNTER — Encounter: Payer: Self-pay | Admitting: Nurse Practitioner

## 2022-07-26 NOTE — Assessment & Plan Note (Signed)
BMI 35.  He has gained 10 pounds since the last 6 months.  He has been limiting his snacking and is going to start trying walking around the yard more.  He is having vertigo and recovering from his fall with left tibia fracture.  With history of elevated blood pressure and sleep apnea, we will start him on Zepbound 2.5 mg injection weekly.  Discussed possible side effects.  Continue with nutrition planning and increasing exercise slowly.  Follow-up in 6 to 8 weeks.

## 2022-07-26 NOTE — Assessment & Plan Note (Signed)
He is having ongoing neuropathy and the injections provided minimal relief.  Will have him stop gabapentin and start Lyrica 75 mg daily.  PDMP reviewed.  Follow-up in 6 to 8 weeks.

## 2022-07-26 NOTE — Assessment & Plan Note (Signed)
His symptoms are improving, still however not fully controlled.  Continue with vestibular rehab and taking meclizine 25 mg 3 times daily as needed.  Encourage fluids.

## 2022-07-29 NOTE — Therapy (Signed)
OUTPATIENT PHYSICAL THERAPY VESTIBULAR TREATMENT     Patient Name: Kenneth Calhoun MRN: [id number] DOB:04/26/1961, 62 y.o., male Today's Date: 07/30/2022  END OF SESSION:  PT End of Session - 07/30/22 1144     Visit Number 4    Date for PT Re-Evaluation 08/15/22    Authorization Type [employer]    PT Start Time 1145    PT Stop Time 1230    PT Time Calculation (min) 45 min    Activity Tolerance Patient tolerated treatment well               Past Medical History:  Diagnosis Date   Arthritis    Depression 12/2021   The noted that he thinks i have Depression and possibly PTSD from my fall in July.  We are working on [employer] counseling now.   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   Past Surgical History:  Procedure Laterality Date   APPENDECTOMY     FRACTURE SURGERY  12/05/2021   OPEN REDUCTION INTERNAL FIXATION (ORIF) TIBIA/FIBULA FRACTURE Left 12/07/2021   Procedure: OPEN REDUCTION INTERNAL FIXATION (ORIF) PILON FRACTURE;  Surgeon: Roby Lofts, MD;  Location: [facility];  Service: Orthopedics;  Laterality: Left;   Patient Active Problem List   Diagnosis Date Noted   Obesity (BMI 30-39.9) 07/25/2022   Benign paroxysmal positional vertigo of right ear 06/26/2022   OSA (obstructive sleep apnea) 04/25/2022   Chronic right-sided low back pain with right-sided sciatica 03/18/2022   Sensation of cold in lower extremity 03/18/2022   Hypersomnia with sleep apnea 02/27/2022   Sleep-related hypoxia 02/27/2022   At risk for central sleep apnea 02/27/2022   Screening for colon cancer 01/24/2022   Snoring 01/24/2022   IFG (impaired fasting glucose) 01/24/2022   Elevated serum creatinine 01/24/2022   History of fracture of lower leg 01/24/2022   Anxiety 01/24/2022   Umbilical hernia without obstruction and without gangrene 01/24/2022   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    PERTINENT HISTORY:  Patient is in today for dizziness since Saturday (Feb 10th).  He states that he was leaning over to plug something in when he had a sudden onset of dizziness.  Since then he has been experiencing dizziness with changes in position and turning his head quickly.  He states that he did have some vertigo in the past, however it did not last this long.  He denies ear pain, fevers, headaches.  He denies recent falls. Positive Dix-Hallpike maneuver on right side. Discussed doing Epley maneuvers at home once to twice a day. Will also give him meclizine 25 mg 3 times daily as needed   ORIF Tib/Fib fx 12/07/21  DIZZINESS: 5/10   PAIN:  Are you having pain? No   PRECAUTIONS: None  WEIGHT BEARING RESTRICTIONS: No  FALLS: Has patient fallen in last 6 months? Yes. Number of falls 1  LIVING ENVIRONMENT: Lives with: lives with their family Lives in: House/apartment Stairs:  Yes: Internal: 15 steps; on right going up and External: 6 steps; on right going up Has following equipment at home: None  PLOF: Independent  PATIENT GOALS: get rid of dizziness   OBJECTIVE:   COGNITION: Overall cognitive status: Within functional limits for tasks assessed   SENSATION: WFL  POSTURE:  rounded shoulders  Cervical ROM:    Active A/PROM (deg) eval  Flexion WFL  Extension Mod limitations  Right lateral flexion Mild tightness  Left lateral flexion Mild tightness  Right rotation WFL  Left rotation WFL   (Blank rows = not tested)  PATIENT SURVEYS:  FOTO 55  VESTIBULAR ASSESSMENT:   SYMPTOM BEHAVIOR:  Non-Vestibular symptoms: tinnitus  Type of dizziness: Spinning/Vertigo and "Funny feeling in the head"  Frequency: every day  Duration: 1 min or less  Aggravating factors: Induced by position change: rolling to the right, rolling to the left, and supine to sit and Induced by motion: looking up at the ceiling and turning head quickly  Relieving factors: head stationary, rest, and slow movements  Progression of symptoms: better  OCULOMOTOR EXAM:  Ocular Alignment: normal  Ocular ROM: No Limitations  Spontaneous Nystagmus: absent  Gaze-Induced Nystagmus: right beating with right gaze  Smooth Pursuits: intact  Saccades: intact  VESTIBULAR - OCULAR REFLEX:   Slow VOR: Comment: dizziness with vertical   VOR Cancellation: Normal  POSITIONAL TESTING: Right Dix-Hallpike: no nystagmus   VESTIBULAR TREATMENT:                                                                                                   DATE:  07/30/22 Brandt-Daroff x2 Then focused on just getting up and down from right side- habituation x10 VOR x1 and x2 on airex 20 reps horizontal and  vertical Seated forward flexion x10  Walking on beam  Step ups on BOSU x10 each side  EC on airex 30s  EC with head turns and nods on airex 20 reps  NuStep L5x52mins    07/23/22 Redo Epley R side- no sx, only slight dizziness when sitting back up  L dix-hallpike- clear  BBQ for R side Standing on airex head turns  Standing on airex head turns looking at target  EC on airex 30s  EC with head turns and nods on airex 20 reps  Walking with head turns, then looking at target, and walking backwards Picking up cones standing on airex Walking with direction changes catching ball    07/17/22 Reassess dizziness  Brandt-Daroff x3 VOR x1 and x2 Standing on airex feet together then [facility] Head turns and nods on airex  Walking with head  turns  07/04/22 Canalith Repositioning:  Epley Right: Number of Reps: 1 and Response to Treatment: symptoms improved   PATIENT EDUCATION: Education details: POC and HEP Person educated: Patient Education method: Explanation Education comprehension: verbalized understanding  HOME EXERCISE PROGRAM:  Access Code: [email or web address] URL: [email or web address] Date: 07/04/2022 Prepared by: Cassie Freer  Exercises - Self-Epley Maneuver Right Ear  - 2 x daily - 7 x weekly - Brandt-Daroff Vestibular Exercise  - 2 x daily - 7 x weekly -  5 reps - Seated Gaze Stabilization with Head Nod  - 1 x daily - 7 x weekly - 2 sets - 10 reps - Seated Gaze Stabilization with Head Rotation  - 1 x daily - 7 x weekly - 2 sets - 10 reps  GOALS: Goals reviewed with patient? Yes  SHORT TERM GOALS: Target date: 07/25/22  Patient will be independent with initial HEP.  Goal status: MET   LONG TERM GOALS: Target date: 08/15/22  Patient will be independent with advanced/ongoing HEP to improve outcomes and carryover.  Goal status: INITIAL  2.  Patient will report 75% improvement in dizziness.  Baseline: 5/10, 50% better- 07/30/22 Goal status: IN PROGRESS  3.  Patient will report 49 on FOTO to demonstrate improved functional ability.  Baseline: 55 Goal status: INITIAL  4.  Patient will demonstrate quick head movements and turning over in bed without dizziness Goal status: IN PROGRESS   ASSESSMENT:  CLINICAL IMPRESSION: Patient still has dizziness when coming up from lying on right side. We worked on some habituation exercises to come in and out of this positions that make him dizzy. Does better with reps of this versus staying in R sidelying for more than 30s before coming up. Lightheaded with vertical VOR x2, seated flexion, and head turns on airex but it does not last long.   OBJECTIVE IMPAIRMENTS: dizziness.   REHAB POTENTIAL: Good  CLINICAL DECISION MAKING:  Stable/uncomplicated  EVALUATION COMPLEXITY: Low   PLAN:  PT FREQUENCY: 1x/week  PT DURATION: 6 weeks  PLANNED INTERVENTIONS: Therapeutic exercises, Therapeutic activity, Neuromuscular re-education, Balance training, Gait training, Patient/Family education, Self Care, Joint mobilization, Vestibular training, Canalith repositioning, Dry Needling, Moist heat, Traction, and Manual therapy  PLAN FOR NEXT SESSION: vestibular reassessment, canalith repositioning, VOR exercises   Cassie Freer, PT 07/30/2022, 12:26 PM

## 2022-07-30 ENCOUNTER — Ambulatory Visit: Payer: 59

## 2022-07-30 DIAGNOSIS — H8111 Benign paroxysmal vertigo, right ear: Secondary | ICD-10-CM | POA: Diagnosis not present

## 2022-07-30 DIAGNOSIS — R42 Dizziness and giddiness: Secondary | ICD-10-CM

## 2022-08-05 NOTE — Therapy (Signed)
OUTPATIENT PHYSICAL THERAPY VESTIBULAR TREATMENT     Patient Name: Kenneth Calhoun MRN: [id number] DOB:[birth year], 62 y.o., male Today's Date: 08/06/2022  END OF SESSION:  PT End of Session - 08/06/22 1149     Visit Number 5    Date for PT Re-Evaluation 08/15/22    Authorization Type [employer]    PT Start Time [id number]    PT Stop Time 1230    PT Time Calculation (min) 45 min    Activity Tolerance Patient tolerated treatment well               Past Medical History:  Diagnosis Date   Arthritis    Depression 12/2021   The noted that he thinks i have Depression and possibly PTSD from my fall in July.  We are working on [employer] counseling now.   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   Past Surgical History:  Procedure Laterality Date   APPENDECTOMY     FRACTURE SURGERY  12/05/2021   OPEN REDUCTION INTERNAL FIXATION (ORIF) TIBIA/FIBULA FRACTURE Left 12/07/2021   Procedure: OPEN REDUCTION INTERNAL FIXATION (ORIF) PILON FRACTURE;  Surgeon: Shona Needles, MD;  Location: [facility];  Service: Orthopedics;  Laterality: Left;   Patient Active Problem List   Diagnosis Date Noted   Obesity (BMI 30-39.9) 07/25/2022   Benign paroxysmal positional vertigo of right ear 06/26/2022   OSA (obstructive sleep apnea) 04/25/2022   Chronic right-sided low back pain with right-sided sciatica 03/18/2022   Sensation of cold in lower extremity 03/18/2022   Hypersomnia with sleep apnea 02/27/2022   Sleep-related hypoxia 02/27/2022   At risk for central sleep apnea 02/27/2022   Screening for colon cancer 01/24/2022   Snoring 01/24/2022   IFG (impaired fasting glucose) 01/24/2022   Elevated serum creatinine 01/24/2022   History of fracture of lower leg 01/24/2022   Anxiety [id number]   Umbilical hernia without obstruction and without gangrene 01/24/2022   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 Not really getting dizzy but it's a motion thing.    PERTINENT HISTORY:  Patient is in today for dizziness since Saturday (Feb 10th).  He states that he was leaning over to plug something in when he had a sudden onset of dizziness.  Since then he has been experiencing dizziness with changes in position and turning his head quickly.  He states that he did have some vertigo in the past, however it did not last this long.  He denies ear pain, fevers, headaches.  He denies recent falls. Positive Dix-Hallpike maneuver on right side. Discussed doing Epley maneuvers at home once to twice a day. Will also give him meclizine 25 mg 3 times daily as needed   ORIF Tib/Fib fx 12/07/21  DIZZINESS: 5/10   PAIN:  Are you having pain? No   PRECAUTIONS: None  WEIGHT BEARING RESTRICTIONS: No  FALLS: Has patient fallen in last 6 months? Yes. Number of falls 1  LIVING ENVIRONMENT: Lives with: lives with their family Lives  in: House/apartment Stairs: Yes: Internal: 15 steps; on right going up and External: 6 steps; on right going up Has following equipment at home: None  PLOF: Independent  PATIENT GOALS: get rid of dizziness   OBJECTIVE:   COGNITION: Overall cognitive status: Within functional limits for tasks assessed   SENSATION: WFL  POSTURE:  rounded shoulders  Cervical ROM:    Active A/PROM (deg) eval  Flexion WFL  Extension Mod limitations  Right lateral flexion Mild tightness  Left lateral flexion Mild tightness  Right rotation WFL  Left rotation  WFL  (Blank rows = not tested)  PATIENT SURVEYS:  FOTO 55  VESTIBULAR ASSESSMENT:   SYMPTOM BEHAVIOR:  Non-Vestibular symptoms: tinnitus  Type of dizziness: Spinning/Vertigo and "Funny feeling in the head"  Frequency: every day  Duration: 1 min or less  Aggravating factors: Induced by position change: rolling to the right, rolling to the left, and supine to sit and Induced by motion: looking up at the ceiling and turning head quickly  Relieving factors: head stationary, rest, and slow movements  Progression of symptoms: better  OCULOMOTOR EXAM:  Ocular Alignment: normal  Ocular ROM: No Limitations  Spontaneous Nystagmus: absent  Gaze-Induced Nystagmus: right beating with right gaze  Smooth Pursuits: intact  Saccades: intact  VESTIBULAR - OCULAR REFLEX:   Slow VOR: Comment: dizziness with vertical   VOR Cancellation: Normal  POSITIONAL TESTING: Right Dix-Hallpike: no nystagmus   VESTIBULAR TREATMENT:                                                                                                   DATE:  08/06/22 NuStep L5 x2mins  Up and down from R sidelying x10  STS on airex 2x10  Quick head turns on airex EC on airex 30s  Tandem standing 30s  Walking on beam Walking with head turns Walking playing catch with changes in direction Volleyball on airex    07/30/22 Brandt-Daroff x2 Then focused on just getting up and down from right side- habituation x10 VOR x1 and x2 on airex 20 reps horizontal and  vertical Seated forward flexion x10  Walking on beam  Step ups on BOSU x10 each side  EC on airex 30s  EC with head turns and nods on airex 20 reps  NuStep L5x70mins    07/23/22 Redo Epley R side- no sx, only slight dizziness when sitting back up  L dix-hallpike- clear  BBQ for R side Standing on airex head turns  Standing on airex head turns looking at target  EC on airex 30s  EC with head turns and nods on airex 20 reps  Walking with head turns, then looking  at target, and walking backwards Picking up cones standing on airex Walking with direction changes catching ball    07/17/22 Reassess dizziness  Brandt-Daroff x3 VOR x1 and x2 Standing on airex feet together then [facility] Head turns and nods on airex  Walking with head turns  07/04/22 Canalith Repositioning:  Epley Right: Number of Reps: 1 and Response to Treatment: symptoms improved   PATIENT EDUCATION: Education details: POC and HEP  Person educated: Patient Education method: Explanation Education comprehension: verbalized understanding  HOME EXERCISE PROGRAM:  Access Code: [email or web address] URL: [email or web address] Date: 07/04/2022 Prepared by: Andris Baumann  Exercises - Self-Epley Maneuver Right Ear  - 2 x daily - 7 x weekly - Brandt-Daroff Vestibular Exercise  - 2 x daily - 7 x weekly - 5 reps - Seated Gaze Stabilization with Head Nod  - 1 x daily - 7 x weekly - 2 sets - 10 reps - Seated Gaze Stabilization with Head Rotation  - 1 x daily - 7 x weekly - 2 sets - 10 reps  GOALS: Goals reviewed with patient? Yes  SHORT TERM GOALS: Target date: 07/25/22  Patient will be independent with initial HEP.  Goal status: MET   LONG TERM GOALS: Target date: 08/15/22  Patient will be independent with advanced/ongoing HEP to improve outcomes and carryover.  Goal status: INITIAL  2.  Patient will report 75% improvement in dizziness.  Baseline: 5/10, 50% better- 07/30/22 Goal status: IN PROGRESS  3.  Patient will report 75 on FOTO to demonstrate improved functional ability.  Baseline: 55 Goal status: INITIAL  4.  Patient will demonstrate quick head movements and turning over in bed without dizziness Goal status: IN PROGRESS   ASSESSMENT:  CLINICAL IMPRESSION Still has some slight symptoms with sitting upright from R sidelying. We did habituation exercises again, after 10 reps he states there is no change but it was significantly better than when he first started. Continued  with higher level balance and multitasking, does well with no c/o of dizziness.   OBJECTIVE IMPAIRMENTS: dizziness.   REHAB POTENTIAL: Good  CLINICAL DECISION MAKING: Stable/uncomplicated  EVALUATION COMPLEXITY: Low   PLAN:  PT FREQUENCY: 1x/week  PT DURATION: 6 weeks  PLANNED INTERVENTIONS: Therapeutic exercises, Therapeutic activity, Neuromuscular re-education, Balance training, Gait training, Patient/Family education, Self Care, Joint mobilization, Vestibular training, Canalith repositioning, Dry Needling, Moist heat, Traction, and Manual therapy  PLAN FOR NEXT SESSION: vestibular reassessment, canalith repositioning, VOR exercises   Andris Baumann, PT 08/06/2022, 12:28 PM

## 2022-08-06 ENCOUNTER — Ambulatory Visit: Payer: 59

## 2022-08-06 DIAGNOSIS — R42 Dizziness and giddiness: Secondary | ICD-10-CM

## 2022-08-06 DIAGNOSIS — H8111 Benign paroxysmal vertigo, right ear: Secondary | ICD-10-CM | POA: Diagnosis not present

## 2022-08-12 NOTE — Therapy (Signed)
OUTPATIENT PHYSICAL THERAPY VESTIBULAR TREATMENT     Patient Name: Kenneth Calhoun MRN: [id number] DOB:02-20-1961, 62 y.o., male Today's Date: 08/13/2022  END OF SESSION:  PT End of Session - 08/13/22 1147     Visit Number 6    Date for PT Re-Evaluation 08/15/22    Authorization Type [employer]    PT Start Time 1145    PT Stop Time 1230    PT Time Calculation (min) 45 min    Activity Tolerance Patient tolerated treatment well               Past Medical History:  Diagnosis Date   Arthritis    Depression 12/2021   The noted that he thinks i have Depression and possibly PTSD from my fall in July.  We are working on [employer] counseling now.   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   Past Surgical History:  Procedure Laterality Date   APPENDECTOMY     FRACTURE SURGERY  12/05/2021   OPEN REDUCTION INTERNAL FIXATION (ORIF) TIBIA/FIBULA FRACTURE Left 12/07/2021   Procedure: OPEN REDUCTION INTERNAL FIXATION (ORIF) PILON FRACTURE;  Surgeon: Shona Needles, MD;  Location: [facility];  Service: Orthopedics;  Laterality: Left;   Patient Active Problem List   Diagnosis Date Noted   Obesity (BMI 30-39.9) 07/25/2022   Benign paroxysmal positional vertigo of right ear 06/26/2022   OSA (obstructive sleep apnea) 04/25/2022   Chronic right-sided low back pain with right-sided sciatica 03/18/2022   Sensation of cold in lower extremity 03/18/2022   Hypersomnia with sleep apnea 02/27/2022   Sleep-related hypoxia 02/27/2022   At risk for central sleep apnea 02/27/2022   Screening for colon cancer 01/24/2022   Snoring 01/24/2022   IFG (impaired fasting glucose) 01/24/2022   Elevated serum creatinine 01/24/2022   History of fracture of lower leg 01/24/2022   Anxiety [id number]   Umbilical hernia without obstruction and without gangrene 01/24/2022   Urethral meatal stenosis 12/10/2021   Acute urinary retention 12/10/2021   Open  displaced pilon fracture of left tibia 12/05/2021    PCP: Vance Peper REFERRING PROVIDER: Vance Peper  REFERRING DIAG:  H81.11 (ICD-10-CM) - Benign paroxysmal positional vertigo of right ear    THERAPY DIAG:  Dizziness and giddiness  BPPV (benign paroxysmal positional vertigo), right  ONSET DATE: 06/22/22  Rationale for Evaluation and Treatment: Rehabilitation  SUBJECTIVE:   SUBJECTIVE STATEMENT:  I feel like I am getting better, I did not do anything this weekend. Not really getting dizzy but it's a motion thing.    PERTINENT HISTORY:  Patient is in today for dizziness since Saturday (Feb 10th).  He states that he was leaning over to plug something in when he had a sudden onset of dizziness.  Since then he has been experiencing dizziness with changes in position and turning his head quickly.  He states that he did have some vertigo in the past, however it did not last this long.  He denies ear pain, fevers, headaches.  He denies recent falls. Positive Dix-Hallpike maneuver on right side. Discussed doing Epley maneuvers at home once to twice a day. Will also give him meclizine 25 mg 3 times daily as needed   ORIF Tib/Fib fx 12/07/21  DIZZINESS: 5/10   PAIN:  Are you having pain? No   PRECAUTIONS: None  WEIGHT BEARING RESTRICTIONS: No  FALLS: Has patient fallen in last 6 months? Yes. Number of falls 1  LIVING ENVIRONMENT:  Lives with: lives with their family Lives in: House/apartment Stairs: Yes: Internal: 15 steps; on right going up and External: 6 steps; on right going up Has following equipment at home: None  PLOF: Independent  PATIENT GOALS: get rid of dizziness   OBJECTIVE:   COGNITION: Overall cognitive status: Within functional limits for tasks assessed   SENSATION: WFL  POSTURE:  rounded shoulders  Cervical ROM:    Active A/PROM (deg) eval  Flexion WFL  Extension Mod limitations  Right lateral flexion Mild tightness  Left lateral flexion  Mild tightness  Right rotation WFL  Left rotation WFL  (Blank rows = not tested)  PATIENT SURVEYS:  FOTO 55  VESTIBULAR ASSESSMENT:   SYMPTOM BEHAVIOR:  Non-Vestibular symptoms: tinnitus  Type of dizziness: Spinning/Vertigo and "Funny feeling in the head"  Frequency: every day  Duration: 1 min or less  Aggravating factors: Induced by position change: rolling to the right, rolling to the left, and supine to sit and Induced by motion: looking up at the ceiling and turning head quickly  Relieving factors: head stationary, rest, and slow movements  Progression of symptoms: better  OCULOMOTOR EXAM:  Ocular Alignment: normal  Ocular ROM: No Limitations  Spontaneous Nystagmus: absent  Gaze-Induced Nystagmus: right beating with right gaze  Smooth Pursuits: intact  Saccades: intact  VESTIBULAR - OCULAR REFLEX:   Slow VOR: Comment: dizziness with vertical   VOR Cancellation: Normal  POSITIONAL TESTING: Right Dix-Hallpike: no nystagmus   VESTIBULAR TREATMENT:                                                                                                   DATE:  08/13/22 Bike L4 x68mins Resisted gait 30# 4 x 4 way On airex straight arm pulls 10# 2x10 STS on airex with catch 2x10 Side steps over obstacles and on airex and in and out of the box Step ups on airex  Standing on BOSU playing catch  Walking on beam    08/06/22 NuStep L5 x62mins  Up and down from R sidelying x10  STS on airex 2x10  Quick head turns on airex EC on airex 30s  Tandem standing 30s  Walking on beam Walking with head turns Walking playing catch with changes in direction Volleyball on airex    07/30/22 Brandt-Daroff x2 Then focused on just getting up and down from right side- habituation x10 VOR x1 and x2 on airex 20 reps horizontal and  vertical Seated forward flexion x10  Walking on beam  Step ups on BOSU x10 each side  EC on airex 30s  EC with head turns and nods on airex 20 reps  NuStep  L5x64mins    07/23/22 Redo Epley R side- no sx, only slight dizziness when sitting back up  L dix-hallpike- clear  BBQ for R side Standing on airex head turns  Standing on airex head turns looking at target  EC on airex 30s  EC with head turns and nods on airex 20 reps  Walking with head turns, then looking at target, and walking backwards Picking up cones standing on airex Walking  with direction changes catching ball    07/17/22 Reassess dizziness  Brandt-Daroff x3 VOR x1 and x2 Standing on airex feet together then [facility] Head turns and nods on airex  Walking with head turns  07/04/22 Canalith Repositioning:  Epley Right: Number of Reps: 1 and Response to Treatment: symptoms improved   PATIENT EDUCATION: Education details: POC and HEP Person educated: Patient Education method: Explanation Education comprehension: verbalized understanding  HOME EXERCISE PROGRAM:  Access Code: [email or web address] URL: [email or web address] Date: 07/04/2022 Prepared by: Andris Baumann  Exercises - Self-Epley Maneuver Right Ear  - 2 x daily - 7 x weekly - Brandt-Daroff Vestibular Exercise  - 2 x daily - 7 x weekly - 5 reps - Seated Gaze Stabilization with Head Nod  - 1 x daily - 7 x weekly - 2 sets - 10 reps - Seated Gaze Stabilization with Head Rotation  - 1 x daily - 7 x weekly - 2 sets - 10 reps  GOALS: Goals reviewed with patient? Yes  SHORT TERM GOALS: Target date: 07/25/22  Patient will be independent with initial HEP.  Goal status: MET   LONG TERM GOALS: Target date: 08/15/22  Patient will be independent with advanced/ongoing HEP to improve outcomes and carryover.  Goal status: MET  2.  Patient will report 75% improvement in dizziness.  Baseline: 5/10, 50% better- 07/30/22, 08/13/22- 75% Goal status: MET  3.  Patient will report 43 on FOTO to demonstrate improved functional ability.  Baseline: 55, 61 Goal status: IN PROGRESS  4.  Patient will demonstrate quick head movements  and turning over in bed without dizziness Goal status: MET   ASSESSMENT:  CLINICAL IMPRESSION Patient is doing well, his BPPV symptoms have decreased significantly. We worked mostly on balance tasks today. He has met just about all his goals and is able to return to his normal activities.   OBJECTIVE IMPAIRMENTS: dizziness.   REHAB POTENTIAL: Good  CLINICAL DECISION MAKING: Stable/uncomplicated  EVALUATION COMPLEXITY: Low   PLAN:  PT FREQUENCY: 1x/week  PT DURATION: 6 weeks  PLANNED INTERVENTIONS: Therapeutic exercises, Therapeutic activity, Neuromuscular re-education, Balance training, Gait training, Patient/Family education, Self Care, Joint mobilization, Vestibular training, Canalith repositioning, Dry Needling, Moist heat, Traction, and Manual therapy  PLAN FOR NEXT SESSION: vestibular reassessment, canalith repositioning, VOR exercises  PHYSICAL THERAPY DISCHARGE SUMMARY  Visits from Start of Care: 6  Patient agrees to discharge. Patient goals were met. Patient is being discharged due to being pleased with the current functional level.   [place], PT 08/13/2022, 12:29 PM

## 2022-08-13 ENCOUNTER — Ambulatory Visit: Payer: 59 | Attending: Nurse Practitioner

## 2022-08-13 DIAGNOSIS — H8111 Benign paroxysmal vertigo, right ear: Secondary | ICD-10-CM | POA: Diagnosis present

## 2022-08-13 DIAGNOSIS — R42 Dizziness and giddiness: Secondary | ICD-10-CM

## 2022-08-22 ENCOUNTER — Encounter: Payer: Self-pay | Admitting: Nurse Practitioner

## 2022-09-18 ENCOUNTER — Other Ambulatory Visit (HOSPITAL_BASED_OUTPATIENT_CLINIC_OR_DEPARTMENT_OTHER): Payer: Self-pay

## 2022-09-18 ENCOUNTER — Telehealth: Payer: Self-pay

## 2022-09-18 MED ORDER — ZEPBOUND 5 MG/0.5ML ~~LOC~~ SOAJ
5.0000 mg | SUBCUTANEOUS | 1 refills | Status: DC
Start: 2022-09-18 — End: 2022-11-20
  Filled 2022-09-18 – 2022-09-19 (×2): qty 2, 28d supply, fill #0
  Filled 2022-10-13: qty 2, 28d supply, fill #1

## 2022-09-18 NOTE — Telephone Encounter (Signed)
Johnsie Kindred! Hope you are doing well!!! You are my Hero!  I found Zepbound 5mg  at    [facility] at [facility] [address] [place], [place] [address]   Can you please send both mine and [employer] over to them for the 5mg ?  His date of birth is 1961-02-27.     Let me know if you have any questions - we both have lost a little more than 25 pounds and feel great!   Thanks! Selena Batten

## 2022-09-19 ENCOUNTER — Other Ambulatory Visit (HOSPITAL_BASED_OUTPATIENT_CLINIC_OR_DEPARTMENT_OTHER): Payer: Self-pay

## 2022-09-19 ENCOUNTER — Other Ambulatory Visit: Payer: Self-pay

## 2022-09-23 ENCOUNTER — Ambulatory Visit: Payer: 59 | Admitting: Nurse Practitioner

## 2022-09-23 ENCOUNTER — Encounter: Payer: Self-pay | Admitting: Nurse Practitioner

## 2022-09-23 VITALS — BP 118/70 | HR 61 | Temp 97.1°F | Ht 70.0 in | Wt 220.0 lb

## 2022-09-23 DIAGNOSIS — E669 Obesity, unspecified: Secondary | ICD-10-CM

## 2022-09-23 DIAGNOSIS — Z6831 Body mass index (BMI) 31.0-31.9, adult: Secondary | ICD-10-CM

## 2022-09-23 DIAGNOSIS — G8929 Other chronic pain: Secondary | ICD-10-CM | POA: Diagnosis not present

## 2022-09-23 DIAGNOSIS — H8111 Benign paroxysmal vertigo, right ear: Secondary | ICD-10-CM

## 2022-09-23 DIAGNOSIS — M5441 Lumbago with sciatica, right side: Secondary | ICD-10-CM

## 2022-09-23 MED ORDER — PREGABALIN 75 MG PO CAPS: 75.0000 mg | ORAL_CAPSULE | Freq: Every day | ORAL | 1 refills | Status: DC

## 2022-09-23 MED ORDER — MECLIZINE HCL 25 MG PO TABS
25.0000 mg | ORAL_TABLET | Freq: Three times a day (TID) | ORAL | 1 refills | Status: DC | PRN
Start: 2022-09-23 — End: 2022-12-25

## 2022-09-23 NOTE — Patient Instructions (Signed)
It was great to see you!  Keep up the great work with the weight loss.  Start lyrica and cymbalta together once a day.   Let's follow-up in 3 months, sooner if you have concerns.  If a referral was placed today, you will be contacted for an appointment. Please note that routine referrals can sometimes take up to 3-4 weeks to process. Please call our office if you haven't heard anything after this time frame.  Take care,  [name], NP

## 2022-09-23 NOTE — Assessment & Plan Note (Addendum)
Chronic, improving.  Continue to take meclizine 25 mg 3 times daily as needed.  He states that he has only been needing this in the mornings.  Continue changing positions slowly and encouraging fluids.

## 2022-09-23 NOTE — Progress Notes (Signed)
Established Patient Office Visit  Subjective   Patient ID: Kenneth Calhoun, male    DOB: 1960/12/16  Age: 62 y.o. MRN: [id number]  Chief Complaint  Patient presents with   Weight Check    Follow up, Rx refill    HPI  Kenneth Calhoun is here to follow-up on vertigo, sciatica, and weight management.  He states that his vertigo has gotten better.  He has been taking the meclizine as needed which is sometimes just in the mornings.  He has been trying to change positions slowly and drink plenty of fluids.  He is still having some nerve pain that goes down his right leg.  He states that it gets worse when he sits and lays down.  He has been going to another neurologist to get a second opinion.  He has another appointment tomorrow for nerve testing.  He was started on duloxetine and is taking 60 mg daily.  He has not been taking the Lyrica as it did not help and he was unsure if he could take it together with the duloxetine.  He denies fevers and incontinence.  He has been taking the Zepbound and has not had any side effects.  He has been watching what he is eating, decreasing portion sizes and trying to increase his movement.  He has lost 25 pounds since his last visit.  He has taken Metamucil a few times to help with constipation.    ROS See pertinent positives and negatives per HPI.    Objective:     BP 118/70 (BP Location: Left Arm)   Pulse 61   Temp (!) 97.1 F (36.2 C)   Ht 5\' 10"  (1.778 m)   Wt 220 lb (99.8 kg)   SpO2 97%   BMI 31.57 kg/m  BP Readings from Last 3 Encounters:  09/23/22 118/70  07/25/22 (!) 150/78  07/24/22 136/80   Wt Readings from Last 3 Encounters:  09/23/22 220 lb (99.8 kg)  07/25/22 245 lb (111.1 kg)  07/24/22 250 lb 12.8 oz (113.8 kg)      Physical Exam Vitals and nursing note reviewed.  Constitutional:      Appearance: Normal appearance.  HENT:     Head: Normocephalic.  Eyes:     Conjunctiva/sclera: Conjunctivae normal.  Cardiovascular:      Rate and Rhythm: Normal rate and regular rhythm.     Pulses: Normal pulses.     Heart sounds: Normal heart sounds.  Pulmonary:     Effort: Pulmonary effort is normal.     Breath sounds: Normal breath sounds.  Musculoskeletal:     Cervical back: Normal range of motion.  Skin:    General: Skin is warm.  Neurological:     General: No focal deficit present.     Mental Status: He is alert and oriented to person, place, and time.  Psychiatric:        Mood and Affect: Mood normal.        Behavior: Behavior normal.        Thought Content: Thought content normal.        Judgment: Judgment normal.     The 10-year ASCVD risk score (Arnett DK, et al., 2019) is: 8.2%    Assessment & Plan:   Problem List Items Addressed This Visit       Nervous and Auditory   Chronic right-sided low back pain with right-sided sciatica - Primary    Chronic, ongoing.  He is currently getting a second  opinion with a different neurologist.  He has nerve testing scheduled for tomorrow.  He is currently taking duloxetine 60 mg daily which has not seemed to help yet but he has only been taking this for 2 weeks.  Will have him take Lyrica 75 mg daily with the duloxetine.  PDMP reviewed.  Refill sent to the pharmacy.  Continue collaboration recommendations from neurology.      Relevant Medications   DULoxetine (CYMBALTA) 60 MG capsule   pregabalin (LYRICA) 75 MG capsule   Benign paroxysmal positional vertigo of right ear    Chronic, improving.  Continue to take meclizine 25 mg 3 times daily as needed.  He states that he has only been needing this in the mornings.  Continue changing positions slowly and encouraging fluids.        Other   Obesity (BMI 30-39.9)    He has lost 25 pounds since his last visit, congratulated him on this!  Continue Zepbound 5 mg injection weekly.  Continue limiting portion sizes and slowly increasing exercise as able.  Continue with any physical restrictions from orthopedics  regarding recent tibial fracture.  Follow-up in 3 months.       Return in about 3 months (around 12/24/2022) for neuropathy, weight management .    Gerre Scull, NP

## 2022-09-23 NOTE — Assessment & Plan Note (Signed)
Chronic, ongoing.  He is currently getting a second opinion with a different neurologist.  He has nerve testing scheduled for tomorrow.  He is currently taking duloxetine 60 mg daily which has not seemed to help yet but he has only been taking this for 2 weeks.  Will have him take Lyrica 75 mg daily with the duloxetine.  PDMP reviewed.  Refill sent to the pharmacy.  Continue collaboration recommendations from neurology.

## 2022-09-23 NOTE — Assessment & Plan Note (Signed)
He has lost 25 pounds since his last visit, congratulated him on this!  Continue Zepbound 5 mg injection weekly.  Continue limiting portion sizes and slowly increasing exercise as able.  Continue with any physical restrictions from orthopedics regarding recent tibial fracture.  Follow-up in 3 months.

## 2022-10-14 ENCOUNTER — Other Ambulatory Visit (HOSPITAL_BASED_OUTPATIENT_CLINIC_OR_DEPARTMENT_OTHER): Payer: Self-pay

## 2022-11-18 ENCOUNTER — Other Ambulatory Visit: Payer: Self-pay | Admitting: Nurse Practitioner

## 2022-11-20 ENCOUNTER — Other Ambulatory Visit (HOSPITAL_BASED_OUTPATIENT_CLINIC_OR_DEPARTMENT_OTHER): Payer: Self-pay

## 2022-11-20 ENCOUNTER — Telehealth: Payer: Self-pay

## 2022-11-20 MED ORDER — ZEPBOUND 2.5 MG/0.5ML ~~LOC~~ SOAJ
2.5000 mg | SUBCUTANEOUS | 1 refills | Status: DC
  Filled 2022-11-20: qty 2, 28d supply, fill #0

## 2022-11-20 NOTE — Telephone Encounter (Signed)
From: Ivar Drape" Sent: 11/20/2022   1:41 PM EDT To: [facility] Subject: ZepBound Refil                                 Kenneth Calhoun (06-09-1960) and I both need refills for Zepbound.    [name] has been having some stomach problems with the 5mg  and wanted to see if he could back down to the 2.5mg .  I want to stay on the 5mg  as I am tolerating it well.  We both are down in excess of 40# as of last week.    Can you send this over for both of [place]?  Please send it to:  [facility] at [facility] [address], [address] [place],  [place]  [address]. They currently have it in stock.    I get to see you next Thursday - I'll try to remember to bring you some eggs.  Have a great rest of your week!!  KIm

## 2022-11-22 ENCOUNTER — Other Ambulatory Visit (HOSPITAL_BASED_OUTPATIENT_CLINIC_OR_DEPARTMENT_OTHER): Payer: Self-pay

## 2022-12-11 ENCOUNTER — Encounter (INDEPENDENT_AMBULATORY_CARE_PROVIDER_SITE_OTHER): Payer: Self-pay

## 2022-12-16 ENCOUNTER — Other Ambulatory Visit (HOSPITAL_BASED_OUTPATIENT_CLINIC_OR_DEPARTMENT_OTHER): Payer: Self-pay

## 2022-12-16 ENCOUNTER — Encounter: Payer: Self-pay | Admitting: Nurse Practitioner

## 2022-12-16 MED ORDER — ZEPBOUND 5 MG/0.5ML ~~LOC~~ SOAJ
5.0000 mg | SUBCUTANEOUS | 1 refills | Status: DC
  Filled 2022-12-16: qty 2, 28d supply, fill #0
  Filled 2023-01-14: qty 2, 28d supply, fill #1

## 2022-12-25 ENCOUNTER — Encounter: Payer: Self-pay | Admitting: Nurse Practitioner

## 2022-12-25 ENCOUNTER — Ambulatory Visit: Payer: 59 | Admitting: Nurse Practitioner

## 2022-12-25 VITALS — BP 124/72 | HR 78 | Temp 97.1°F | Ht 70.0 in | Wt 208.0 lb

## 2022-12-25 DIAGNOSIS — M5441 Lumbago with sciatica, right side: Secondary | ICD-10-CM | POA: Diagnosis not present

## 2022-12-25 DIAGNOSIS — E782 Mixed hyperlipidemia: Secondary | ICD-10-CM | POA: Diagnosis not present

## 2022-12-25 DIAGNOSIS — E663 Overweight: Secondary | ICD-10-CM | POA: Diagnosis not present

## 2022-12-25 DIAGNOSIS — G8929 Other chronic pain: Secondary | ICD-10-CM

## 2022-12-25 DIAGNOSIS — R42 Dizziness and giddiness: Secondary | ICD-10-CM

## 2022-12-25 MED ORDER — MECLIZINE HCL 25 MG PO TABS: 25.0000 mg | ORAL_TABLET | Freq: Three times a day (TID) | ORAL | 1 refills | Status: DC | PRN

## 2022-12-25 NOTE — Progress Notes (Signed)
Established Patient Office Visit  Subjective   Patient ID: Kenneth Calhoun, male    DOB: 03-09-1961  Age: 62 y.o. MRN: [id number]  Chief Complaint  Patient presents with   Weight Managment    Follow up with neuropathy    HPI  Kenneth Calhoun is here to follow-up on neuropathy and weight management.   He states that he is following with both the neurosurgeon and neurologist to help with his ongoing neuropathy.  He stopped the Lyrica and Cymbalta because they were not helping.  He is going to be going to complete rehab for Andonye therapy next week.  He states that he will be going 3 times a week for 6 to 8 weeks.  He has been taking the Zepbound and not having any side effects from this.  He has lost another 12 pounds since his last visit with a total of 42 pounds.  He has really adjusted his diet and has still been trying to walk frequently.  He has still been having some lightheadedness, especially when he wakes up in the morning gets out of bed and sometimes if he is turning his head frequently.  He states that it will last for a few seconds and then go away.  He has been taking meclizine as needed which does help.  He states that the major vertigo has gone away.  He denies chest pain and shortness of breath.  He does not check his blood pressure at home.  ROS See pertinent positives and negatives per HPI.    Objective:     BP 124/72 (BP Location: Left Arm)   Pulse 78   Temp (!) 97.1 F (36.2 C)   Ht 5\' 10"  (1.778 m)   Wt 208 lb (94.3 kg)   SpO2 98%   BMI 29.84 kg/m  BP Readings from Last 3 Encounters:  12/25/22 124/72  09/23/22 118/70  07/25/22 (!) 150/78   Wt Readings from Last 3 Encounters:  12/25/22 208 lb (94.3 kg)  09/23/22 220 lb (99.8 kg)  07/25/22 245 lb (111.1 kg)   Orthostatic VS for the past 72 hrs (Last 3 readings):  Orthostatic BP Patient Position BP Location  12/25/22 1517 118/60 Standing --  12/25/22 1516 124/80 Sitting --  12/25/22 1515 122/80  Supine --  12/25/22 1459 -- -- Left Arm    Physical Exam Vitals and nursing note reviewed.  Constitutional:      Appearance: Normal appearance.  HENT:     Head: Normocephalic.  Eyes:     Conjunctiva/sclera: Conjunctivae normal.  Cardiovascular:     Rate and Rhythm: Normal rate and regular rhythm.     Pulses: Normal pulses.     Heart sounds: Normal heart sounds.  Pulmonary:     Effort: Pulmonary effort is normal.     Breath sounds: Normal breath sounds.  Musculoskeletal:     Cervical back: Normal range of motion.  Skin:    General: Skin is warm.  Neurological:     General: No focal deficit present.     Mental Status: He is alert and oriented to person, place, and time.  Psychiatric:        Mood and Affect: Mood normal.        Behavior: Behavior normal.        Thought Content: Thought content normal.        Judgment: Judgment normal.     The 10-year ASCVD risk score (Arnett DK, et al., 2019) is: 9.7%  Assessment & Plan:   Problem List Items Addressed This Visit       Nervous and Auditory   Chronic right-sided low back pain with right-sided sciatica - Primary    Chronic, ongoing.  He is following with a neurologist who has referred him to complete rehab for and Andonye therapy for 6-8 weeks.  He stopped the Lyrica and Cymbalta because they were not helping.  Continue collaboration recommendation from specialist.        Other   Overweight    He has lost a total of 42 pounds since starting this Zepbound, congratulated him on this expection point we will have him continue Zepbound 5 mg injections weekly.  Continue with nutrition and exercise changes.  His goal is to get to 190 pounds.      Relevant Orders   TSH   Mixed hyperlipidemia    Chronic, stable.  Will check CMP, CBC, lipid panel today.      Relevant Orders   CBC   Comprehensive metabolic panel   Lipid panel   Other Visit Diagnoses     Vertigo       Will have him check his blood pressure at home  laying down and standing first thing in the morning. Encourage fluids. May need referral to ENT       Return in about 3 months (around 03/27/2023) for weight management .    Gerre Scull, NP

## 2022-12-25 NOTE — Patient Instructions (Signed)
It was great to see you!  Start checking your blood pressure laying down and sitting up first thing in the morning for a week. Send me the readings after a week.   We are checking your labs today and will let you know the results via mychart/phone.   Let's follow-up in 3 months, sooner if you have concerns.  If a referral was placed today, you will be contacted for an appointment. Please note that routine referrals can sometimes take up to 3-4 weeks to process. Please call our office if you haven't heard anything after this time frame.  Take care,  [name], NP

## 2022-12-25 NOTE — Assessment & Plan Note (Signed)
Chronic, stable.  Will check CMP, CBC, lipid panel today.

## 2022-12-25 NOTE — Assessment & Plan Note (Signed)
He has lost a total of 42 pounds since starting this Zepbound, congratulated him on this expection point we will have him continue Zepbound 5 mg injections weekly.  Continue with nutrition and exercise changes.  His goal is to get to 190 pounds.

## 2022-12-25 NOTE — Assessment & Plan Note (Signed)
Chronic, ongoing.  He is following with a neurologist who has referred him to complete rehab for and Andonye therapy for 6-8 weeks.  He stopped the Lyrica and Cymbalta because they were not helping.  Continue collaboration recommendation from specialist.

## 2022-12-26 LAB — CBC
HCT: 43.8 % (ref 39.0–52.0)
Hemoglobin: 15 g/dL (ref 13.0–17.0)
MCHC: 34.2 g/dL (ref 30.0–36.0)
MCV: 86.6 fl (ref 78.0–100.0)
Platelets: 303 10*3/uL (ref 150.0–400.0)
RBC: 5.05 Mil/uL (ref 4.22–5.81)
RDW: 13.2 % (ref 11.5–15.5)
WBC: 10.6 10*3/uL — ABNORMAL HIGH (ref 4.0–10.5)

## 2022-12-26 LAB — COMPREHENSIVE METABOLIC PANEL
ALT: 19 U/L (ref 0–53)
AST: 22 U/L (ref 0–37)
Albumin: 4.4 g/dL (ref 3.5–5.2)
Alkaline Phosphatase: 75 U/L (ref 39–117)
BUN: 20 mg/dL (ref 6–23)
CO2: 25 meq/L (ref 19–32)
Calcium: 9 mg/dL (ref 8.4–10.5)
Chloride: 101 meq/L (ref 96–112)
Creatinine, Ser: 1.46 mg/dL (ref 0.40–1.50)
GFR: 51.38 mL/min — ABNORMAL LOW (ref >60.00)
Glucose, Bld: 83 mg/dL (ref 70–99)
Potassium: 4 meq/L (ref 3.5–5.1)
Sodium: 134 meq/L — ABNORMAL LOW (ref 135–145)
Total Bilirubin: 0.7 mg/dL (ref 0.2–1.2)
Total Protein: 6.7 g/dL (ref 6.0–8.3)

## 2022-12-26 LAB — LIPID PANEL
Cholesterol: 167 mg/dL (ref 0–200)
HDL: 36.4 mg/dL — ABNORMAL LOW (ref >39.00)
LDL Cholesterol: 115 mg/dL — ABNORMAL HIGH (ref 0–99)
NonHDL: 130.98
Total CHOL/HDL Ratio: 5
Triglycerides: 81 mg/dL (ref 0.0–149.0)
VLDL: 16.2 mg/dL (ref 0.0–40.0)

## 2022-12-26 LAB — TSH: TSH: 1.36 u[IU]/mL (ref 0.35–5.50)

## 2023-01-14 ENCOUNTER — Other Ambulatory Visit (HOSPITAL_BASED_OUTPATIENT_CLINIC_OR_DEPARTMENT_OTHER): Payer: Self-pay

## 2023-01-29 ENCOUNTER — Encounter: Payer: Self-pay | Admitting: Anesthesiology

## 2023-01-29 NOTE — Progress Notes (Unsigned)
[facility] [address] [place]. [place] [address] [phone]       OFFICE FOLLOW UP NOTE  [name] Date of Birth:  Apr 05, 1961 Medical Record Number:  [id number]   Reason for visit: Initial CPAP follow-up    SUBJECTIVE:   CHIEF COMPLAINT:  No chief complaint on file.  Follow-up visit:  Prior visit: 07/25/2022   Brief HPI:   Kenneth Calhoun is a 62 y.o. male who was initially seen by Dr. Vickey Huger on 01/29/2019 for concern of underlying sleep apnea with complaints of snoring, witnessed apnea, nonrestorative sleep, morning headaches and fatigue.  ESS 10/24.  Completed HST 04/16/2022 which showed moderate OSA with total AHI of 24.2/h and O2 nadir of 84%.  Recommend initiation of AutoPap.  Set up date 05/03/2022.  At prior visit, initial compliance showed satisfactory usage and optimal residual AHI. ESS 6/24.     Interval history:   Initial 30-day compliance report showed satisfactory usage and optimal residual AHI. Past 30 days slightly lower compliance due to having a cold recently and issues with vertigo and wearing mask mad his vertigo worse. Wife reports improvement of snoring with use of CPAP. He remains tired but believes this is more due to not being as active due to prior leg injury in which he is still recovering from, still working with therapy and hopes to be able to return back to work this summer.   Epworth Sleepiness Scale 6/24 (prior to CPAP 10/24)           ROS:   14 system review of systems performed and negative with exception of those listed in HPI  PMH:  Past Medical History:  Diagnosis Date   Arthritis    Depression 12/2021   The noted that he thinks i have Depression and possibly PTSD from my fall in July.  We are working on [employer] counseling now.   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    PSH:  Past Surgical History:  Procedure Laterality Date    APPENDECTOMY     FRACTURE SURGERY  12/05/2021   OPEN REDUCTION INTERNAL FIXATION (ORIF) TIBIA/FIBULA FRACTURE Left 12/07/2021   Procedure: OPEN REDUCTION INTERNAL FIXATION (ORIF) PILON FRACTURE;  Surgeon: Roby Lofts, MD;  Location: [facility] OR;  Service: Orthopedics;  Laterality: Left;    Social History:  Social History   Socioeconomic History   Marital status: Married    Spouse name: Not on file   Number of children: Not on file   Years of education: Not on file   Highest education level: 12th grade  Occupational History   Not on file  Tobacco Use   Smoking status: Never   Smokeless tobacco: Never  Vaping Use   Vaping status: Never Used  Substance and Sexual Activity   Alcohol use: No   Drug use: No   Sexual activity: Yes    Birth control/protection: None  Other Topics Concern   Not on file  Social History Narrative   Not on file   Social Determinants of Health   Financial Resource Strain: Low Risk  (12/24/2022)   Overall Financial Resource Strain (CARDIA)    Difficulty of Paying Living Expenses: Not hard at all  Food Insecurity: No Food Insecurity (12/24/2022)   Hunger Vital Sign    Worried About Running Out of Food in the Last Year: Never true    Ran Out of Food in the Last Year: Never true  Transportation Needs: No Transportation Needs (12/24/2022)   PRAPARE - Administrator, Civil Service (Medical): No    Lack of Transportation (Non-Medical): No  Physical Activity: Sufficiently Active (12/24/2022)   Exercise Vital Sign    Days of Exercise per Week: 3 days    Minutes of Exercise per Session: 60 min  Stress: Stress Concern Present (12/24/2022)   [employer] of Occupational Health - Occupational Stress Questionnaire    Feeling of Stress : To some extent  Social Connections: Moderately Integrated (12/24/2022)   Social Connection and Isolation Panel [NHANES]    Frequency of Communication with Friends and Family: Three times a week    Frequency of Social  Gatherings with Friends and Family: Twice a week    Attends Religious Services: 1 to 4 times per year    Active Member of [employer] or Organizations: No    Attends Engineer, structural: Not on file    Marital Status: Married  Catering manager Violence: Not on file    Family History:  Family History  Problem Relation Age of Onset   Alcohol abuse Mother    Alcohol abuse Father    Arthritis Sister    Diabetes Sister    Obesity Sister    Arthritis Sister    Depression Sister    Arthritis Brother    Depression Sister    Diabetes Sister    Obesity Sister     Medications:   Current Outpatient Medications on File Prior to Visit  Medication Sig Dispense Refill   meclizine (ANTIVERT) 25 MG tablet Take 1 tablet (25 mg total) by mouth 3 (three) times daily as needed for dizziness. 90 tablet 1   tirzepatide (ZEPBOUND) 5 MG/0.5ML Pen Inject 5 mg into the skin once a week. 2 mL 1   No current facility-administered medications on file prior to visit.    Allergies:  No Known Allergies    OBJECTIVE:  Physical Exam  There were no vitals filed for this visit.  There is no height or weight on file to calculate BMI. No results found.   General: well developed, well nourished, very pleasant middle-age Caucasian male, seated, in no evident distress Head: head normocephalic and atraumatic.   Neck: supple with no carotid or supraclavicular bruits Cardiovascular: regular rate and rhythm, no murmurs Musculoskeletal: no deformity Skin:  no rash/petichiae Vascular:  Normal pulses all extremities   Neurologic Exam Mental Status: Awake and fully alert. Oriented to place and time. Recent and remote memory intact. Attention span, concentration and fund of knowledge appropriate. Mood and affect appropriate.  Cranial Nerves: Pupils equal, briskly reactive to light. Extraocular movements full without nystagmus. Visual fields full to confrontation. Hearing intact. Facial sensation intact.  Face, tongue, palate moves normally and symmetrically.  Motor: Normal bulk and tone. Normal strength in all tested extremity muscles Sensory.: intact to touch , pinprick , position and vibratory sensation.  Coordination: Rapid alternating movements normal in all extremities. Finger-to-nose and heel-to-shin performed accurately bilaterally. Gait and Station: Arises from chair without difficulty. Stance is normal. Gait demonstrates normal stride length and balance without use of AD.  Reflexes: 1+ and symmetric. Toes downgoing.         ASSESSMENT/PLAN: Kenneth Calhoun is a 62 y.o. year old male    OSA on CPAP : Compliance report shows low usage although optimal residual AHI with use.  Continue current pressure settings.  Discussed importance of increasing nightly usage with ensuring greater than 4 hours nightly for  optimal benefit and per insurance purposes.  Continue to follow with DME company for any needed supplies or CPAP related concerns     Follow up in 6 months or call earlier if needed   CC:  PCP: McElwee, Lauren A, NP    I spent 21 minutes of face-to-face and non-face-to-face time with patient and wife.  This included previsit chart review, lab review, study review, order entry, electronic health record documentation, patient education and discussion regarding above diagnoses and treatment and answered all other questions to patient satisfaction   Ihor Austin, [facility]  [facility] [address] [place], [place] [address]  Phone [phone] Fax [phone] Note: This document was prepared with digital dictation and possible smart phrase technology. Any transcriptional errors that result from this process are unintentional.

## 2023-01-30 ENCOUNTER — Telehealth (INDEPENDENT_AMBULATORY_CARE_PROVIDER_SITE_OTHER): Payer: 59 | Admitting: Adult Health

## 2023-01-30 ENCOUNTER — Encounter: Payer: Self-pay | Admitting: Adult Health

## 2023-01-30 VITALS — Wt 204.0 lb

## 2023-01-30 DIAGNOSIS — G4733 Obstructive sleep apnea (adult) (pediatric): Secondary | ICD-10-CM

## 2023-02-03 ENCOUNTER — Telehealth: Payer: Self-pay

## 2023-02-05 ENCOUNTER — Encounter: Payer: Self-pay | Admitting: Nurse Practitioner

## 2023-02-05 DIAGNOSIS — H9319 Tinnitus, unspecified ear: Secondary | ICD-10-CM

## 2023-02-14 ENCOUNTER — Other Ambulatory Visit: Payer: Self-pay | Admitting: Neurosurgery

## 2023-02-14 DIAGNOSIS — M533 Sacrococcygeal disorders, not elsewhere classified: Secondary | ICD-10-CM

## 2023-02-17 ENCOUNTER — Other Ambulatory Visit: Payer: Self-pay | Admitting: Nurse Practitioner

## 2023-02-18 ENCOUNTER — Ambulatory Visit
Admission: RE | Admit: 2023-02-18 | Discharge: 2023-02-18 | Disposition: A | Discharge status: Home / Self Care | Payer: 59 | Source: Ambulatory Visit | Attending: Neurosurgery | Admitting: Neurosurgery

## 2023-02-18 DIAGNOSIS — M533 Sacrococcygeal disorders, not elsewhere classified: Secondary | ICD-10-CM

## 2023-02-20 ENCOUNTER — Encounter (HOSPITAL_BASED_OUTPATIENT_CLINIC_OR_DEPARTMENT_OTHER): Payer: Self-pay

## 2023-02-20 ENCOUNTER — Other Ambulatory Visit (HOSPITAL_BASED_OUTPATIENT_CLINIC_OR_DEPARTMENT_OTHER): Payer: Self-pay

## 2023-02-20 ENCOUNTER — Other Ambulatory Visit: Payer: Self-pay | Admitting: Nurse Practitioner

## 2023-02-20 MED ORDER — ZEPBOUND 5 MG/0.5ML ~~LOC~~ SOAJ
5.0000 mg | SUBCUTANEOUS | 1 refills | Status: DC
  Filled 2023-02-20: qty 2, 28d supply, fill #0
  Filled 2023-03-23: qty 2, 28d supply, fill #1

## 2023-02-21 ENCOUNTER — Other Ambulatory Visit (HOSPITAL_BASED_OUTPATIENT_CLINIC_OR_DEPARTMENT_OTHER): Payer: Self-pay

## 2023-03-03 ENCOUNTER — Encounter: Payer: Self-pay | Admitting: Nurse Practitioner

## 2023-03-05 MED ORDER — GABAPENTIN 300 MG PO CAPS: 300.0000 mg | ORAL_CAPSULE | Freq: Every day | ORAL | 1 refills | Status: DC

## 2023-03-10 ENCOUNTER — Encounter: Payer: Self-pay | Admitting: Adult Health

## 2023-03-10 DIAGNOSIS — G4733 Obstructive sleep apnea (adult) (pediatric): Secondary | ICD-10-CM

## 2023-03-11 ENCOUNTER — Ambulatory Visit: Payer: 59 | Attending: Nurse Practitioner | Admitting: Audiologist

## 2023-03-11 DIAGNOSIS — H903 Sensorineural hearing loss, bilateral: Secondary | ICD-10-CM | POA: Diagnosis present

## 2023-03-11 NOTE — Procedures (Signed)
  Outpatient Audiology and [facility] [address] [place], [place]  [address] [phone]  AUDIOLOGICAL  EVALUATION  NAME: Kenneth Calhoun     DOB:   [birth year]      MRN: [id number]                                                                                     DATE: 03/11/2023     REFERENT: Gerre Scull, NP STATUS: Outpatient DIAGNOSIS: Sensorineural Hearing Loss Bilateral    History: [name] was seen for an audiological evaluation due to dizziness. He is having dizziness after receiving medication Gabapentin. He is not sure if that's the reason, but the dizziness started around the same time. [name] was under the impression he is seeing Otolaryngology today. Saam denies any difficulty hearing. He has no pain or pressure in either ear. He has occasional ringing in each ear. He has history of noise exposure from working in [employer].   Evaluation:  Otoscopy showed a clear view of the tympanic membranes, bilaterally Tympanometry results were consistent with normal middle ear function, bilaterally   Audiometric testing was completed using Conventional Audiometry techniques with insert earphones and TDH headphones. Test results are consistent with normal hearing 250-2kHz sloping to moderately severe sensorineural hearing loss bilaterally. Speech Recognition Thresholds were obtained at 15dB HL in the right ear and at 15dB HL in the left ear. Word Recognition Testing was completed at  55dB HL and [name] scored 100% in each ear.   Results:  The test results were reviewed with [name]. He has a sloping sensorineural hearing loss bilaterally. If he needs vestibular testing he will need to see [facility]. Recommend annual hearing testing, and to try hearing aids when ready for daily use.   Recommendations: 1.   Annual hearing tests recommended due to history of noise exposure. Follow up with any previous referrals to Otolaryngology.    32 minutes spent testing and  counseling on results.   If you have any questions please feel free to contact me at [phone].  Ammie Ferrier Audiologist, Au.D., CCC-A 03/11/2023  2:01 PM  Cc: Gerre Scull, NP

## 2023-03-24 ENCOUNTER — Other Ambulatory Visit (HOSPITAL_BASED_OUTPATIENT_CLINIC_OR_DEPARTMENT_OTHER): Payer: Self-pay

## 2023-03-25 ENCOUNTER — Ambulatory Visit: Payer: 59 | Admitting: Neurology

## 2023-03-25 DIAGNOSIS — G4733 Obstructive sleep apnea (adult) (pediatric): Secondary | ICD-10-CM | POA: Diagnosis not present

## 2023-04-08 NOTE — Progress Notes (Signed)
SLEEP MEDICINE CLINIC    [facility] Kenneth Calhoun Male, 62 y.o., January 29, 1961 - MRN: [id number]   HOME SLEEP TEST REPORT ( by Watch PAT)  MAIL OUT DEVICE STUDY DATE:  04-08-2023 ORDERING CLINICIAN: Ihor Austin , NP  REFERRING CLINICIAN: Ihor Austin, NP    CLINICAL INFORMATION/HISTORY:  01-30-2023; Video visit with NP McCue who ordered this HST:  Compliance report shows low usage, residual AHI with use. Has been having difficulties tolerating due to sensation of dry mouth. Current use of nasal pillow. He questions if CPAP is still needed due to 50 lbs weight loss since initial sleep study. Reports he sleeps well without CPAP, feels well rested and good daytime energy levels, denies snoring or witnessed apneas.    07-25-2022 : NP visit. Kenneth Calhoun is a 62 y.o. male who is being seen for initial compliance visit.  Completed HST 04/16/2022 which showed moderate OSA with total AHI of 24.2/h and O2 nadir of 84%.  Recommend initiation of AutoPap.  6-12 cm water pressure, AHI 2.5/h 95% pressure was 9.7 cm water.   Set up date 05/03/2022.   Interval history:   Initial 30-day compliance report showed satisfactory usage and optimal residual AHI. Past 30 days slightly lower compliance due to having a cold recently and issues with vertigo and wearing mask mad his vertigo worse. Wife reports improvement of snoring with use of CPAP. He remains tired but believes this is more due to not being as active due to prior leg injury in which he is still recovering from, still working with therapy and hopes to be able to return back to work this summer.    02-27-2022: Epworth Sleepiness Scale 6/24 and FSS 40/ 63 points, GDS 6/ 15 points. (prior to CPAP 10/24).Kenneth Calhoun  has a past medical history of Arthritis, Depression (12/2021), Gastritis, GERD (gastroesophageal reflux disease), and Urethral meatal stenosis (12/10/2021). Recent fall and fracture.  He was hospitalized and snored and had  apnea, as witnessed by nursing staff. He ws given oxygen ! Nocturia 2-4 times. Here strictly for Apnea evaluation/ Sleep consult.     PCP Note : He is still having some nerve pain that goes down his right leg.  He states that it gets worse when he sits and lays down.  He has been going to another neurologist to get a second opinion.  He has another appointment tomorrow for nerve testing.  He was started on duloxetine and is taking 60 mg daily.  He has not been taking the Lyrica as it did not help and he was unsure if he could take it together with the duloxetine.  He denies fevers and incontinence. BPV . Chronic sciatic pain. Sees [facility] , Dx carpal tunnel in both hands.    Obesity: He has been taking the Zepbound and has not had any side effects.  He has been watching what he is eating, decreasing portion sizes and trying to increase his movement.  He has lost 25 pounds since his last visit.  He has taken Metamucil a few times to help with constipation.     Epworth sleepiness score: 6 /24. No new FSS or GDS score.    BMI: 29.7 kg/m   Neck Circumference: NA ( video visit)   FINDINGS:   Sleep Summary:   Total Recording Time (hours, min):    9 hours 24 minutes   Total Sleep Time (hours, min):    8 hours 8  minutes             Percent REM (%):     21.3%                                   Respiratory Indices:   Calculated pAHI (per hour):      Following AASM criteria apnea hypopnea index is currently 20.1/h                       REM pAHI: 28.3/h                                               NREM pAHI:   17.8/h                           Positional AHI:     There were 290 minutes in supine sleep recorded associated with an AHI of 28.6/h and an RDI of 30.9/h.  Followed by 104 minutes left lateral sleep with an AHI of 13.8/h RDI of 15.5/h.  Right lateral sleep was seen for 94 minutes and associated with an AHI of 0.6/h.  Snoring the patient's mean snoring level reached 40 dB which is  just at threshold and was present for only 2.5% of total recorded time.                                              Oxygen Saturation Statistics:   Oxygen Saturation (%) Mean: Mean oxygen saturation was 94%              O2 Saturation Range (%):    Oxygen saturation varied between a minimum of 84% of maximum saturation at 99%                                   O2 Saturation (minutes) <89%:   4.3 minutes        Pulse Rate Statistics:   Pulse Mean (bpm):        64 bpm         Pulse Range:    Between 53 and 114 bpm             IMPRESSION:  This HST confirms the presence of moderate severe sleep apnea of obstructive origin.  No central sleep apnea was recorded. There was a strong positional component noted that should inform therapy recommendations.   Sleep hypoxia was clinically not relevant.  Snoring was absent.   RECOMMENDATION: The sleep apnea is still REM sleep accentuated and for this reason should respond best to positive airway pressure therapy.  He had no apnea while sleeping on his right side (!) and should avoid supine sleep - the position in which he spent most of the night of the recording.  However, if the patient is tolerating CPAP poorly and cannot sleep in non-supine position or has other obstacles to compliance, he could decide on a dental device or (with caution)  hypoglossal nerve stimulator. Review of his compliance data showed excellent therapeutic response and a reduction to AHI of  2.5/h.   This degree of apnea control is hard to achieve with a dental device or a tongue nerve stimulator.  To answer the patient's questions yes he still has sleep apnea to a moderate degree and to positive airway pressure therapy would still be the main recommendation, with positional modification. His interface ( mask 0 should allow to sleep non-supine . Plan B/C would be a dental device which could address the part of non-REM sleep apnea.   Follow-up with nurse practitioner McCue for  discussion of results and goals.     INTERPRETING PHYSICIAN:   Melvyn Novas, MD

## 2023-04-20 NOTE — Procedures (Signed)
SLEEP MEDICINE CLINIC    [facility] Kenneth Calhoun Male, 62 y.o., January 29, 1961 - MRN: [id number]   HOME SLEEP TEST REPORT ( by Watch PAT)  MAIL OUT DEVICE STUDY DATE:  04-08-2023 ORDERING CLINICIAN: Ihor Austin , NP  REFERRING CLINICIAN: Ihor Austin, NP    CLINICAL INFORMATION/HISTORY:  01-30-2023; Video visit with NP McCue who ordered this HST:  Compliance report shows low usage, residual AHI with use. Has been having difficulties tolerating due to sensation of dry mouth. Current use of nasal pillow. He questions if CPAP is still needed due to 50 lbs weight loss since initial sleep study. Reports he sleeps well without CPAP, feels well rested and good daytime energy levels, denies snoring or witnessed apneas.    07-25-2022 : NP visit. Kenneth Calhoun is a 62 y.o. male who is being seen for initial compliance visit.  Completed HST 04/16/2022 which showed moderate OSA with total AHI of 24.2/h and O2 nadir of 84%.  Recommend initiation of AutoPap.  6-12 cm water pressure, AHI 2.5/h 95% pressure was 9.7 cm water.   Set up date 05/03/2022.   Interval history:   Initial 30-day compliance report showed satisfactory usage and optimal residual AHI. Past 30 days slightly lower compliance due to having a cold recently and issues with vertigo and wearing mask mad his vertigo worse. Wife reports improvement of snoring with use of CPAP. He remains tired but believes this is more due to not being as active due to prior leg injury in which he is still recovering from, still working with therapy and hopes to be able to return back to work this summer.    02-27-2022: Epworth Sleepiness Scale 6/24 and FSS 40/ 63 points, GDS 6/ 15 points. (prior to CPAP 10/24).Kenneth Calhoun  has a past medical history of Arthritis, Depression (12/2021), Gastritis, GERD (gastroesophageal reflux disease), and Urethral meatal stenosis (12/10/2021). Recent fall and fracture.  He was hospitalized and snored and had  apnea, as witnessed by nursing staff. He ws given oxygen ! Nocturia 2-4 times. Here strictly for Apnea evaluation/ Sleep consult.     PCP Note : He is still having some nerve pain that goes down his right leg.  He states that it gets worse when he sits and lays down.  He has been going to another neurologist to get a second opinion.  He has another appointment tomorrow for nerve testing.  He was started on duloxetine and is taking 60 mg daily.  He has not been taking the Lyrica as it did not help and he was unsure if he could take it together with the duloxetine.  He denies fevers and incontinence. BPV . Chronic sciatic pain. Sees [facility] , Dx carpal tunnel in both hands.    Obesity: He has been taking the Zepbound and has not had any side effects.  He has been watching what he is eating, decreasing portion sizes and trying to increase his movement.  He has lost 25 pounds since his last visit.  He has taken Metamucil a few times to help with constipation.     Epworth sleepiness score: 6 /24. No new FSS or GDS score.    BMI: 29.7 kg/m   Neck Circumference: NA ( video visit)   FINDINGS:   Sleep Summary:   Total Recording Time (hours, min):    9 hours 24 minutes   Total Sleep Time (hours, min):    8 hours 8  minutes             Percent REM (%):     21.3%                                   Respiratory Indices:   Calculated pAHI (per hour):      Following AASM criteria apnea hypopnea index is currently 20.1/h                       REM pAHI: 28.3/h                                               NREM pAHI:   17.8/h                           Positional AHI:     There were 290 minutes in supine sleep recorded associated with an AHI of 28.6/h and an RDI of 30.9/h.  Followed by 104 minutes left lateral sleep with an AHI of 13.8/h RDI of 15.5/h.  Right lateral sleep was seen for 94 minutes and associated with an AHI of 0.6/h.  Snoring the patient's mean snoring level reached 40 dB which is  just at threshold and was present for only 2.5% of total recorded time.                                              Oxygen Saturation Statistics:   Oxygen Saturation (%) Mean: Mean oxygen saturation was 94%              O2 Saturation Range (%):    Oxygen saturation varied between a minimum of 84% of maximum saturation at 99%                                   O2 Saturation (minutes) <89%:   4.3 minutes        Pulse Rate Statistics:   Pulse Mean (bpm):        64 bpm         Pulse Range:    Between 53 and 114 bpm             IMPRESSION:  This HST confirms the presence of moderate severe sleep apnea of obstructive origin.  No central sleep apnea was recorded. There was a strong positional component noted that should inform therapy recommendations.   Sleep hypoxia was clinically not relevant.  Snoring was absent.   RECOMMENDATION: The sleep apnea is still REM sleep accentuated and for this reason should respond best to positive airway pressure therapy.  He had no apnea while sleeping on his right side (!) and should avoid supine sleep - the position in which he spent most of the night of the recording.  However, if the patient is tolerating CPAP poorly and cannot sleep in non-supine position or has other obstacles to compliance, he could decide on a dental device or (with caution)  hypoglossal nerve stimulator. Review of his compliance data showed excellent therapeutic response and a reduction to AHI of  2.5/h.   This degree of apnea control is hard to achieve with a dental device or a tongue nerve stimulator.  To answer the patient's questions yes he still has sleep apnea to a moderate degree and to positive airway pressure therapy would still be the main recommendation, with positional modification. His interface ( mask 0 should allow to sleep non-supine . Plan B/C would be a dental device which could address the part of non-REM sleep apnea.   Follow-up with nurse practitioner McCue for  discussion of results and goals.     INTERPRETING PHYSICIAN:   Melvyn Novas, MD

## 2023-04-21 ENCOUNTER — Other Ambulatory Visit: Payer: Self-pay | Admitting: Nurse Practitioner

## 2023-04-22 ENCOUNTER — Encounter: Payer: Self-pay | Admitting: Nurse Practitioner

## 2023-04-25 ENCOUNTER — Encounter: Payer: Self-pay | Admitting: Nurse Practitioner

## 2023-04-25 DIAGNOSIS — E669 Obesity, unspecified: Secondary | ICD-10-CM

## 2023-04-25 MED ORDER — TIRZEPATIDE-WEIGHT MANAGEMENT 5 MG/0.5ML ~~LOC~~ SOLN
5.0000 mg | SUBCUTANEOUS | 2 refills | Status: DC
Start: 2023-04-25 — End: 2023-06-30

## 2023-04-25 MED ORDER — PREGABALIN 75 MG PO CAPS: 75.0000 mg | ORAL_CAPSULE | Freq: Two times a day (BID) | ORAL | 1 refills | Status: DC

## 2023-05-08 ENCOUNTER — Encounter: Payer: Self-pay | Admitting: Nurse Practitioner

## 2023-05-09 ENCOUNTER — Other Ambulatory Visit: Payer: Self-pay | Admitting: Family

## 2023-05-09 DIAGNOSIS — H905 Unspecified sensorineural hearing loss: Secondary | ICD-10-CM

## 2023-06-11 ENCOUNTER — Telehealth (INDEPENDENT_AMBULATORY_CARE_PROVIDER_SITE_OTHER): Payer: Self-pay | Admitting: Audiology

## 2023-06-11 ENCOUNTER — Telehealth (INDEPENDENT_AMBULATORY_CARE_PROVIDER_SITE_OTHER): Payer: Self-pay | Admitting: Otolaryngology

## 2023-06-11 NOTE — Telephone Encounter (Signed)
Reminder Call: Date: 06/12/2023 Status: Sch  Time: 10:15 AM [address] [place], [place] [address]  Confirmed time and location w/patient.

## 2023-06-11 NOTE — Telephone Encounter (Signed)
Reminder Call: Date: 06/12/2023 Status: Sch  Time: 11:00 AM [address] [place], [place] [address]  Confirmed time and location w/patient.

## 2023-06-12 ENCOUNTER — Ambulatory Visit (INDEPENDENT_AMBULATORY_CARE_PROVIDER_SITE_OTHER): Payer: 59 | Admitting: Audiology

## 2023-06-12 ENCOUNTER — Encounter (INDEPENDENT_AMBULATORY_CARE_PROVIDER_SITE_OTHER): Payer: Self-pay

## 2023-06-12 ENCOUNTER — Ambulatory Visit (INDEPENDENT_AMBULATORY_CARE_PROVIDER_SITE_OTHER): Payer: 59 | Admitting: Otolaryngology

## 2023-06-12 VITALS — BP 142/86 | HR 66 | Resp 19 | Wt 204.0 lb

## 2023-06-12 DIAGNOSIS — H9313 Tinnitus, bilateral: Secondary | ICD-10-CM | POA: Diagnosis not present

## 2023-06-12 DIAGNOSIS — H903 Sensorineural hearing loss, bilateral: Secondary | ICD-10-CM | POA: Diagnosis not present

## 2023-06-12 DIAGNOSIS — H8111 Benign paroxysmal vertigo, right ear: Secondary | ICD-10-CM

## 2023-06-12 NOTE — Progress Notes (Signed)
Dear Dr. Hyman Hopes, Here is my assessment for our mutual patient, Kenneth Calhoun. Thank you for allowing me the opportunity to care for your patient. Please do not hesitate to contact me should you have any other questions. Sincerely, Dr. Jovita Kussmaul  Otolaryngology Clinic Note Referring provider: Dr. Hyman Hopes HPI:  Kenneth Calhoun is a 63 y.o. male kindly referred by Dr. Hyman Hopes for evaluation of dizziness and tinnitus.  Initial visit (05/2023): Patient reports: bilateral tinnitus, buzzing sound. Low pitch. Not pulsatile. Worse in quiet environments. Bothers some when going to sleep. Going on for about 6 months, nothing has really brought it about but everything started after his Neuropathy. No known head trauma.  He does report vertigo, and has had PT for it. He reports that when he looks up, he gets rare vertigo -- he reports that he has not had it lately (room spinning). His current symptoms include that he gets some lighthededness after prolonged episodes of looking up but otherwise no frank vertigo.  Patient denies: ear pain, fullness, vertigo, drainage Patient additionally denies: deep pain in ear canal, eustachian tube symptoms such as popping, crackling, sensitive to pressure changes Patient also denies barotrauma, vestibular suppressant use, ototoxic medication use Prior ear surgery: no  H&N Surgery: o Personal or FHx of bleeding dz or anesthesia difficulty: no   PMHx: OSA on CPAP, Neuropathy, Sicatic nerve problem.  AP/AC: no  Independent Review of Additional Tests or Records:  03/11/2023 Audiogram was independently reviewed and interpreted by me and it reveals - bilateral symmetric SNHL downsloping; A/A tymps; WRT 100% at 55dB HL   SNHL= Sensorineural hearing loss  [name] (12/2022), NP - FM - noted lightheadedness when waking up and turning his head; taking meclizine, helps some; vertigo has gone away; Dx: Vertigo, c/f orthostasis; Rx: Ref ENT -- same provider notes 06/26/2022:  noted dizziness, leaning over and sudden dizziness with change in position and turning head; positive Dix-hallpike right; Dx: BPPV, Rx: Meclizine Cassie Freer 07/04/2022 PT - vestibular rehab -- multiple sessions; better over time, duration <1 min, improving; OM exam normal, gaze induced right beating nystagmus; Dix-Hallpike negative; even better April 2024 - not getting dizzy, doing well CBC and CMP 12/25/2022: WBC 10.6, Hgb 15, Cr 1.46, BUN 20  PMH/Meds/All/SocHx/FamHx/ROS:   Past Medical History:  Diagnosis Date   Arthritis    Depression 12/2021   The noted that he thinks i have Depression and possibly PTSD from my fall in July.  We are working on [employer] counseling now.   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     Past Surgical History:  Procedure Laterality Date   APPENDECTOMY     FRACTURE SURGERY  12/05/2021   OPEN REDUCTION INTERNAL FIXATION (ORIF) TIBIA/FIBULA FRACTURE Left 12/07/2021   Procedure: OPEN REDUCTION INTERNAL FIXATION (ORIF) PILON FRACTURE;  Surgeon: Roby Lofts, MD;  Location: [facility] OR;  Service: Orthopedics;  Laterality: Left;    Family History  Problem Relation Age of Onset   Alcohol abuse Mother    Alcohol abuse Father    Arthritis Sister    Diabetes Sister    Obesity Sister    Arthritis Sister    Depression Sister    Arthritis Brother    Depression Sister    Diabetes Sister    Obesity Sister      Social Connections: Moderately Integrated (12/24/2022)   Social Connection and Isolation Panel [NHANES]    Frequency of Communication with Friends and Family:  Three times a week    Frequency of Social Gatherings with Friends and Family: Twice a week    Attends Religious Services: 1 to 4 times per year    Active Member of [employer] or Organizations: No    Attends Engineer, structural: Not on file    Marital Status: Married      Current Outpatient Medications:    meclizine (ANTIVERT) 25 MG tablet,  Take 1 tablet (25 mg total) by mouth 3 (three) times daily as needed for dizziness., Disp: 90 tablet, Rfl: 1   pregabalin (LYRICA) 75 MG capsule, Take 1 capsule (75 mg total) by mouth 2 (two) times daily., Disp: 60 capsule, Rfl: 1   tirzepatide 5 MG/0.5ML injection vial, Inject 5 mg into the skin once a week., Disp: 2 mL, Rfl: 2   Physical Exam:   BP (!) 142/86 (BP Location: Left Arm, Patient Position: Sitting, Cuff Size: Normal)   Pulse 66   Resp 19   Wt 204 lb (92.5 kg)   SpO2 97%   BMI 29.27 kg/m   Salient findings:  CN II-XII intact  Bilateral EAC clear and TM intact with well pneumatized middle ear spaces Weber 512: mid Rinne 512: AC > BC b/l  Head shake neg; DH neg Anterior rhinoscopy: Septum intact; bilateral inferior turbinates without significant hypertrophy No lesions of oral cavity/oropharynx No obviously palpable neck masses/lymphadenopathy/thyromegaly No respiratory distress or stridor  Seprately Identifiable Procedures:  None  Impression & Plans:  Kenneth Calhoun is a 63 y.o. male with:  1. Sensorineural hearing loss (SNHL) of both ears   2. Bilateral tinnitus   3. BPPV (benign paroxysmal positional vertigo), right    Non-pulsatile tinnitus -- d/w patient most likely 2/2 HL; we discussed options including masking device, Flavonoids, HA and tinnitus retraining; he'd like to start with masking device and flavonoids BPPV - much better, lightheadedness could also be due to orthostasis; we discussed rpt PT but decl. Advised checking orthostatic vitals D/w pt f/u, will do PRN   See below regarding exact medications prescribed this encounter including dosages and route: No orders of the defined types were placed in this encounter.     Thank you for allowing me the opportunity to care for your patient. Please do not hesitate to contact me should you have any other questions.  Sincerely, Jovita Kussmaul, MD Otolaryngologist (ENT), [facility] Phone:  [phone] Fax: [phone]  06/15/2023, 12:18 PM   MDM:  Level 4 Complexity/Problems addressed: mod - multiple chronic Data complexity: mod - review of notes, tests, labs - Morbidity: low - Prescription Drug prescribed or managed: no

## 2023-06-12 NOTE — Patient Instructions (Addendum)
Try white noise machine from [facility]  Flavonoids --- try over the counter; can sometimes help with tinnitus Think about hearing aids

## 2023-06-30 ENCOUNTER — Other Ambulatory Visit: Payer: Self-pay | Admitting: Nurse Practitioner

## 2023-06-30 DIAGNOSIS — E669 Obesity, unspecified: Secondary | ICD-10-CM

## 2023-08-09 ENCOUNTER — Other Ambulatory Visit: Payer: Self-pay | Admitting: Student

## 2023-08-09 DIAGNOSIS — M5416 Radiculopathy, lumbar region: Secondary | ICD-10-CM

## 2023-08-11 ENCOUNTER — Encounter: Payer: Self-pay | Admitting: Nurse Practitioner

## 2023-08-11 DIAGNOSIS — E669 Obesity, unspecified: Secondary | ICD-10-CM

## 2023-08-11 MED ORDER — TIRZEPATIDE-WEIGHT MANAGEMENT 7.5 MG/0.5ML ~~LOC~~ SOLN: 7.5000 mg | SUBCUTANEOUS | 0 refills | Status: DC

## 2023-08-23 ENCOUNTER — Ambulatory Visit
Admission: RE | Admit: 2023-08-23 | Discharge: 2023-08-23 | Disposition: A | Discharge status: Home / Self Care | Source: Ambulatory Visit | Attending: Student | Admitting: Student

## 2023-08-23 DIAGNOSIS — M5416 Radiculopathy, lumbar region: Secondary | ICD-10-CM

## 2023-08-23 MED ORDER — GADOPICLENOL 0.5 MMOL/ML IV SOLN
10.0000 mL | Freq: Once | INTRAVENOUS | Status: AC | PRN
  Administered 2023-08-23: 10 mL via INTRAVENOUS

## 2023-09-05 ENCOUNTER — Other Ambulatory Visit: Payer: Self-pay | Admitting: Nurse Practitioner

## 2023-09-05 DIAGNOSIS — E669 Obesity, unspecified: Secondary | ICD-10-CM

## 2023-09-23 ENCOUNTER — Other Ambulatory Visit

## 2023-09-24 ENCOUNTER — Other Ambulatory Visit: Payer: Self-pay | Admitting: Nurse Practitioner

## 2023-09-24 DIAGNOSIS — E669 Obesity, unspecified: Secondary | ICD-10-CM

## 2023-09-24 NOTE — Telephone Encounter (Signed)
 Requesting: ZEPBOUND  7.5 MG/0.5ML SUBCUTANEOUS SOLUTION  Last Visit: 12/25/2022 Next Visit: Visit date not found Last Refill: 09/08/2023  Please Advise

## 2023-10-17 ENCOUNTER — Other Ambulatory Visit: Payer: Self-pay | Admitting: Nurse Practitioner

## 2023-10-17 DIAGNOSIS — E669 Obesity, unspecified: Secondary | ICD-10-CM

## 2023-10-17 NOTE — Telephone Encounter (Signed)
 Requesting: ZEPBOUND  7.5 MG/0.5ML SUBCUTANEOUS SOLUTION  Last Visit: 12/25/2022 Next Visit: Visit date not found Last Refill: 09/24/2023  Please Advise

## 2023-10-17 NOTE — Telephone Encounter (Signed)
 Lvm today @ 2:31 to try to get patient to schedule an OV for any future  medication refills

## 2023-12-22 ENCOUNTER — Ambulatory Visit: Payer: Self-pay | Admitting: Emergency Medicine

## 2023-12-22 ENCOUNTER — Ambulatory Visit (INDEPENDENT_AMBULATORY_CARE_PROVIDER_SITE_OTHER): Admitting: Emergency Medicine

## 2023-12-22 ENCOUNTER — Ambulatory Visit: Payer: Self-pay

## 2023-12-22 VITALS — BP 132/84 | HR 71 | Temp 97.8°F | Resp 16 | Ht 70.0 in | Wt 210.0 lb

## 2023-12-22 DIAGNOSIS — M79675 Pain in left toe(s): Secondary | ICD-10-CM

## 2023-12-22 DIAGNOSIS — N1831 Chronic kidney disease, stage 3a: Secondary | ICD-10-CM | POA: Diagnosis not present

## 2023-12-22 LAB — BASIC METABOLIC PANEL WITH GFR
BUN: 16 mg/dL (ref 6–23)
CO2: 28 meq/L (ref 19–32)
Calcium: 8.4 mg/dL (ref 8.4–10.5)
Chloride: 102 meq/L (ref 96–112)
Creatinine, Ser: 1.29 mg/dL (ref 0.40–1.50)
GFR: 59.2 mL/min — ABNORMAL LOW (ref >60.00)
Glucose, Bld: 83 mg/dL (ref 70–99)
Potassium: 4.1 meq/L (ref 3.5–5.1)
Sodium: 138 meq/L (ref 135–145)

## 2023-12-22 LAB — URIC ACID: Uric Acid, Serum: 5.1 mg/dL (ref 4.0–7.8)

## 2023-12-22 MED ORDER — METHYLPREDNISOLONE SODIUM SUCC 125 MG IJ SOLR
125.0000 mg | Freq: Once | INTRAMUSCULAR | Status: AC
  Administered 2023-12-22 (×2): 125 mg via INTRAMUSCULAR

## 2023-12-22 MED ORDER — PREDNISONE 10 MG (21) PO TBPK
ORAL_TABLET | ORAL | 0 refills | Status: DC
Start: 2023-12-22 — End: 2024-01-08

## 2023-12-22 NOTE — Patient Instructions (Addendum)
 Bloodwork will come back tonight- we will let you know results when we have them.  Shot in office today, start prednisone  pills in the morning.  I suggest NO ibuprofen/advil, naproxen /aleve , or the indomethacin  for now- let's see how kidney function  Also no reason to continue the colchicine  Recommend a scheduled follow-up with Lauren in 4-6 weeks.

## 2023-12-22 NOTE — Telephone Encounter (Signed)
 Pt calling as he was recently seen in the ED and dx with gout, states that pain has not increased, but has not decreased either. Routing to clinic, no appts until 8/20 for hosp f/u.   Copied from CRM #[id number]. Topic: Clinical - Red Word Triage >> Dec 22, 2023  7:51 AM Mercedes MATSU wrote: Red Word that prompted transfer to Nurse Triage: Patient called in stating that he had a really bad gout flare up to the point he thought his big toe was broken. He went to the ED, and after discharge they informed him to follow up with his PCP asap. Patient states he is still in a lot of pain and still having extreme gout symptoms.

## 2023-12-22 NOTE — Telephone Encounter (Signed)
 I called and spoke with patient and scheduled him for 920am with Corean Geralds, [facility]

## 2023-12-22 NOTE — Progress Notes (Signed)
 Subjective:  Toe Pain (Left big toe pain and swollen foot since Friday AM. Seen by [facility] on Saturday- dx with gout. )    HPI: Kenneth Calhoun is a 63 y.o. male presenting on 12/22/2023 with report of ongoing left great toe pain starting 3 days ago after catching her toe on a curb.  He was seen by orthopedics about 24 hours after the pain began, negative x-rays, and was told he probably had gout and prescribed colchicine  and indomethacin .  He reports they told him to follow-up with primary care to consider preventive therapy.  He has no prior diagnosis of gout.  He does not drink any alcohol.  He does consume a good amount of red meat but not in the last week or so. He denies any fevers or feeling unwell, suspected bite or recent wound to the area of pain to the foot.  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    ROS: Negative unless specifically indicated above in HPI.   Relevant past medical history reviewed and updated as indicated.   Allergies and medications reviewed and updated.   Current Outpatient Medications:    colchicine  0.6 MG tablet, Take 1 tablet every day by oral route with meal(s)., Disp: , Rfl:    indomethacin  (INDOCIN  SR) 75 MG CR capsule, Take 1 capsule twice a day by oral route with meal(s)., Disp: , Rfl:    predniSONE  (STERAPRED UNI-PAK 21 TAB) 10 MG (21) TBPK tablet, Take as directed on package, Disp: 21 tablet, Rfl: 0   pregabalin  (LYRICA ) 75 MG capsule, Take 1 capsule (75 mg total) by mouth 2 (two) times daily., Disp: 60 capsule, Rfl: 1   ZEPBOUND  7.5 MG/0.5ML injection vial, INJECT 0.5 ML (7.5 MG) UNDER THE SKIN ONCE WEEKLY (0.5ML= 50 UNITS), Disp: 2 mL, Rfl: 0   meclizine  (ANTIVERT ) 25 MG tablet, Take 1 tablet (25 mg total) by mouth 3 (three) times daily as needed for dizziness. (Patient not taking: Reported on 12/22/2023), Disp: 90 tablet, Rfl: 1  Current Facility-Administered Medications:     methylPREDNISolone  sodium succinate (SOLU-MEDROL ) 125 mg/2 mL injection 125 mg, 125 mg, Intramuscular, Once,   No Known Allergies  Objective:   BP 132/84   Pulse 71   Temp 97.8 F (36.6 C)   Resp 16   Ht 5' 10 (1.778 m)   Wt 210 lb (95.3 kg)   SpO2 98%   BMI 30.13 kg/m    Physical Exam Vitals and nursing note reviewed.  Constitutional:      Appearance: Normal appearance.  Eyes:     Conjunctiva/sclera: Conjunctivae normal.  Pulmonary:     Effort: Pulmonary effort is normal.  Musculoskeletal:     Right lower leg: No tenderness. No edema.     Left lower leg: No tenderness. No edema.     Right ankle: No swelling. No tenderness.     Left ankle: No swelling. No tenderness.     Left foot: Normal capillary refill. Swelling (and TTP with erythema over 1st MTP joint) present. No bunion or laceration. Normal pulse.       Legs:  Neurological:     Mental Status: He is alert and oriented to person, place, and time.       Assessment & Plan:  1. Great toe pain, left (Primary) Poss gout. No wound, and some improvement so far makes infection unlikely in overall low risk patient. Neg xrays  per pt. Some improvement in pain with indomethacin  and colchicine , but chart review shows GFR 50 at baseline. I have recommended against high-dose NSAIDs, and will switch to steroids. IM solumedrol, and to start pred tomorrow.   - Uric acid - Basic metabolic panel with GFR - methylPREDNISolone  sodium succinate (SOLU-MEDROL ) 125 mg/2 mL injection 125 mg - predniSONE  (STERAPRED UNI-PAK 21 TAB) 10 MG (21) TBPK tablet; Take as directed on package  Dispense: 21 tablet; Refill: 0  Patient Instructions  Bloodwork will come back tonight- we will let you know results when we have them.  Shot in office today, start prednisone  pills in the morning.  I suggest NO ibuprofen/advil, naproxen /aleve , or the indomethacin  for now- let's see how kidney function  Also no reason to continue the colchicine  Recommend a  scheduled follow-up with Lauren in 4-6 weeks.    Follow up plan: Return in about 4 weeks (around 01/19/2024).  Corean Geralds, MSPAS, PA-C

## 2023-12-29 ENCOUNTER — Emergency Department (HOSPITAL_BASED_OUTPATIENT_CLINIC_OR_DEPARTMENT_OTHER)

## 2023-12-29 ENCOUNTER — Encounter (HOSPITAL_BASED_OUTPATIENT_CLINIC_OR_DEPARTMENT_OTHER): Payer: Self-pay

## 2023-12-29 ENCOUNTER — Other Ambulatory Visit: Payer: Self-pay

## 2023-12-29 DIAGNOSIS — D72829 Elevated white blood cell count, unspecified: Secondary | ICD-10-CM | POA: Diagnosis present

## 2023-12-29 DIAGNOSIS — T380X5A Adverse effect of glucocorticoids and synthetic analogues, initial encounter: Secondary | ICD-10-CM | POA: Diagnosis present

## 2023-12-29 DIAGNOSIS — E669 Obesity, unspecified: Secondary | ICD-10-CM | POA: Diagnosis present

## 2023-12-29 DIAGNOSIS — N1831 Chronic kidney disease, stage 3a: Secondary | ICD-10-CM | POA: Diagnosis present

## 2023-12-29 DIAGNOSIS — M868X7 Other osteomyelitis, ankle and foot: Principal | ICD-10-CM | POA: Diagnosis present

## 2023-12-29 DIAGNOSIS — Z818 Family history of other mental and behavioral disorders: Secondary | ICD-10-CM

## 2023-12-29 DIAGNOSIS — Z79899 Other long term (current) drug therapy: Secondary | ICD-10-CM

## 2023-12-29 DIAGNOSIS — R195 Other fecal abnormalities: Secondary | ICD-10-CM | POA: Diagnosis present

## 2023-12-29 DIAGNOSIS — E782 Mixed hyperlipidemia: Secondary | ICD-10-CM | POA: Diagnosis present

## 2023-12-29 DIAGNOSIS — L02612 Cutaneous abscess of left foot: Secondary | ICD-10-CM | POA: Diagnosis present

## 2023-12-29 DIAGNOSIS — Z6830 Body mass index (BMI) 30.0-30.9, adult: Secondary | ICD-10-CM

## 2023-12-29 DIAGNOSIS — Z833 Family history of diabetes mellitus: Secondary | ICD-10-CM

## 2023-12-29 DIAGNOSIS — Z811 Family history of alcohol abuse and dependence: Secondary | ICD-10-CM

## 2023-12-29 DIAGNOSIS — G90522 Complex regional pain syndrome I of left lower limb: Secondary | ICD-10-CM | POA: Diagnosis present

## 2023-12-29 DIAGNOSIS — M10072 Idiopathic gout, left ankle and foot: Secondary | ICD-10-CM | POA: Diagnosis present

## 2023-12-29 DIAGNOSIS — M109 Gout, unspecified: Secondary | ICD-10-CM | POA: Diagnosis not present

## 2023-12-29 DIAGNOSIS — Z8261 Family history of arthritis: Secondary | ICD-10-CM

## 2023-12-29 NOTE — ED Triage Notes (Addendum)
 Arrives via EMS. L foot pain for 1 week. Worsened tonight. Hx of broken leg 2 years ago, told it was gout. Swollen and warm to touch. Took tylenol  PM, gabapentin  before arrival.

## 2023-12-30 ENCOUNTER — Inpatient Hospital Stay (HOSPITAL_BASED_OUTPATIENT_CLINIC_OR_DEPARTMENT_OTHER)
Admission: EM | Admit: 2023-12-30 | Discharge: 2024-01-08 | DRG: 478 | Disposition: A | Discharge status: Home Health | Attending: Internal Medicine | Admitting: Internal Medicine

## 2023-12-30 DIAGNOSIS — Z743 Need for continuous supervision: Secondary | ICD-10-CM | POA: Diagnosis not present

## 2023-12-30 DIAGNOSIS — M109 Gout, unspecified: Principal | ICD-10-CM | POA: Diagnosis present

## 2023-12-30 DIAGNOSIS — R52 Pain, unspecified: Secondary | ICD-10-CM | POA: Diagnosis present

## 2023-12-30 DIAGNOSIS — M10072 Idiopathic gout, left ankle and foot: Secondary | ICD-10-CM

## 2023-12-30 DIAGNOSIS — M869 Osteomyelitis, unspecified: Principal | ICD-10-CM

## 2023-12-30 DIAGNOSIS — E782 Mixed hyperlipidemia: Secondary | ICD-10-CM | POA: Diagnosis present

## 2023-12-30 DIAGNOSIS — M79605 Pain in left leg: Secondary | ICD-10-CM | POA: Diagnosis not present

## 2023-12-30 DIAGNOSIS — M79673 Pain in unspecified foot: Secondary | ICD-10-CM | POA: Diagnosis not present

## 2023-12-30 DIAGNOSIS — E669 Obesity, unspecified: Secondary | ICD-10-CM | POA: Diagnosis present

## 2023-12-30 DIAGNOSIS — N1831 Chronic kidney disease, stage 3a: Secondary | ICD-10-CM | POA: Diagnosis present

## 2023-12-30 LAB — CBC WITH DIFFERENTIAL/PLATELET
Abs Immature Granulocytes: 0.2 K/uL — ABNORMAL HIGH (ref 0.00–0.07)
Basophils Absolute: 0.1 K/uL (ref 0.0–0.1)
Basophils Relative: 0 %
Eosinophils Absolute: 0.2 K/uL (ref 0.0–0.5)
Eosinophils Relative: 1 %
HCT: 44.9 % (ref 39.0–52.0)
Hemoglobin: 15.6 g/dL (ref 13.0–17.0)
Immature Granulocytes: 1 %
Lymphocytes Relative: 11 %
Lymphs Abs: 1.8 K/uL (ref 0.7–4.0)
MCH: 30.2 pg (ref 26.0–34.0)
MCHC: 34.7 g/dL (ref 30.0–36.0)
MCV: 87 fL (ref 80.0–100.0)
Monocytes Absolute: 1.3 K/uL — ABNORMAL HIGH (ref 0.1–1.0)
Monocytes Relative: 8 %
Neutro Abs: 12.8 K/uL — ABNORMAL HIGH (ref 1.7–7.7)
Neutrophils Relative %: 79 %
Platelets: 271 K/uL (ref 150–400)
RBC: 5.16 MIL/uL (ref 4.22–5.81)
RDW: 12.9 % (ref 11.5–15.5)
WBC: 16.5 K/uL — ABNORMAL HIGH (ref 4.0–10.5)
nRBC: 0 % (ref 0.0–0.2)

## 2023-12-30 LAB — BASIC METABOLIC PANEL WITH GFR
Anion gap: 12 (ref 5–15)
BUN: 19 mg/dL (ref 8–23)
CO2: 25 mmol/L (ref 22–32)
Calcium: 9.2 mg/dL (ref 8.9–10.3)
Chloride: 99 mmol/L (ref 98–111)
Creatinine, Ser: 1.4 mg/dL — ABNORMAL HIGH (ref 0.61–1.24)
GFR, Estimated: 56 mL/min — ABNORMAL LOW (ref >60)
Glucose, Bld: 103 mg/dL — ABNORMAL HIGH (ref 70–99)
Potassium: 3.9 mmol/L (ref 3.5–5.1)
Sodium: 136 mmol/L (ref 135–145)

## 2023-12-30 LAB — SEDIMENTATION RATE: Sed Rate: 30 mm/h — ABNORMAL HIGH (ref 0–16)

## 2023-12-30 LAB — LACTIC ACID, PLASMA: Lactic Acid, Venous: 0.8 mmol/L (ref 0.5–1.9)

## 2023-12-30 MED ORDER — METHYLPREDNISOLONE SODIUM SUCC 125 MG IJ SOLR
125.0000 mg | Freq: Once | INTRAMUSCULAR | Status: AC
  Administered 2023-12-30: 125 mg via INTRAVENOUS
  Filled 2023-12-30: qty 2

## 2023-12-30 MED ORDER — HYDROMORPHONE HCL 1 MG/ML IJ SOLN
1.0000 mg | INTRAMUSCULAR | Status: DC | PRN
  Administered 2023-12-30 – 2024-01-08 (×46): 1 mg via INTRAVENOUS
  Filled 2023-12-30 (×47): qty 1

## 2023-12-30 MED ORDER — KETOROLAC TROMETHAMINE 30 MG/ML IJ SOLN
30.0000 mg | Freq: Once | INTRAMUSCULAR | Status: AC
  Administered 2023-12-30: 30 mg via INTRAVENOUS
  Filled 2023-12-30: qty 1

## 2023-12-30 MED ORDER — MECLIZINE HCL 25 MG PO TABS: 25.0000 mg | ORAL_TABLET | Freq: Every day | ORAL | Status: DC | PRN

## 2023-12-30 MED ORDER — HYDROMORPHONE HCL 1 MG/ML IJ SOLN
2.0000 mg | Freq: Once | INTRAMUSCULAR | Status: AC
  Administered 2023-12-30: 2 mg via INTRAVENOUS
  Filled 2023-12-30: qty 2

## 2023-12-30 MED ORDER — ONDANSETRON HCL 4 MG PO TABS: 4.0000 mg | ORAL_TABLET | Freq: Four times a day (QID) | ORAL | Status: DC | PRN

## 2023-12-30 MED ORDER — SODIUM CHLORIDE 0.9 % IV BOLUS
1000.0000 mL | Freq: Once | INTRAVENOUS | Status: AC
  Administered 2023-12-30: 1000 mL via INTRAVENOUS

## 2023-12-30 MED ORDER — ACETAMINOPHEN 325 MG PO TABS
650.0000 mg | ORAL_TABLET | Freq: Four times a day (QID) | ORAL | Status: DC | PRN
  Administered 2023-12-31 – 2024-01-05 (×3): 650 mg via ORAL
  Filled 2023-12-30 (×3): qty 2

## 2023-12-30 MED ORDER — ENOXAPARIN SODIUM 40 MG/0.4ML IJ SOSY
40.0000 mg | PREFILLED_SYRINGE | INTRAMUSCULAR | Status: DC
  Filled 2023-12-30 (×2): qty 0.4

## 2023-12-30 MED ORDER — HYDROMORPHONE HCL 1 MG/ML IJ SOLN
2.0000 mg | Freq: Once | INTRAMUSCULAR | Status: AC
  Administered 2023-12-30: 2 mg via INTRAMUSCULAR
  Filled 2023-12-30: qty 2

## 2023-12-30 MED ORDER — ONDANSETRON HCL 4 MG/2ML IJ SOLN
4.0000 mg | Freq: Four times a day (QID) | INTRAMUSCULAR | Status: DC | PRN
Start: 2023-12-30 — End: 2024-01-08

## 2023-12-30 MED ORDER — ACETAMINOPHEN 650 MG RE SUPP: 650.0000 mg | Freq: Four times a day (QID) | RECTAL | Status: DC | PRN

## 2023-12-30 MED ORDER — OXYCODONE HCL 5 MG PO TABS
10.0000 mg | ORAL_TABLET | ORAL | Status: DC | PRN
  Administered 2023-12-30 – 2024-01-08 (×37): 10 mg via ORAL
  Filled 2023-12-30 (×40): qty 2

## 2023-12-30 MED ORDER — DIPHENHYDRAMINE HCL 25 MG PO CAPS
25.0000 mg | ORAL_CAPSULE | Freq: Once | ORAL | Status: AC | PRN
  Administered 2023-12-30: 25 mg via ORAL
  Filled 2023-12-30: qty 1

## 2023-12-30 MED ORDER — PREGABALIN 50 MG PO CAPS
100.0000 mg | ORAL_CAPSULE | Freq: Three times a day (TID) | ORAL | Status: DC
  Administered 2023-12-30 – 2024-01-08 (×28): 100 mg via ORAL
  Filled 2023-12-30 (×28): qty 2

## 2023-12-30 MED ORDER — PREDNISONE 20 MG PO TABS
40.0000 mg | ORAL_TABLET | Freq: Every day | ORAL | Status: DC
  Administered 2023-12-31: 40 mg via ORAL
  Filled 2023-12-30: qty 2

## 2023-12-30 MED ORDER — INDOMETHACIN ER 75 MG PO CPCR: 75.0000 mg | ORAL_CAPSULE | Freq: Two times a day (BID) | ORAL | Status: DC

## 2023-12-30 MED ORDER — ALBUTEROL SULFATE (2.5 MG/3ML) 0.083% IN NEBU: 2.5000 mg | INHALATION_SOLUTION | RESPIRATORY_TRACT | Status: DC | PRN

## 2023-12-30 MED ORDER — PREGABALIN 50 MG PO CAPS: 100.0000 mg | ORAL_CAPSULE | Freq: Four times a day (QID) | ORAL | Status: DC

## 2023-12-30 MED ORDER — HYDROMORPHONE HCL 1 MG/ML IJ SOLN
1.0000 mg | Freq: Once | INTRAMUSCULAR | Status: AC
  Administered 2023-12-30: 1 mg via INTRAVENOUS
  Filled 2023-12-30: qty 1

## 2023-12-30 NOTE — H&P (Signed)
 History and Physical  Ronak Duquette Pottinger FMW:[id number] DOB: 25-Mar-1961 DOA: 12/30/2023  PCP: Nedra Tinnie LABOR, NP   Chief Complaint: Intractable left foot pain  HPI: Kenneth Calhoun is a 63 y.o. male with medical history significant for complex regional left lower extremity pain followed by pain management and recent diagnosis of gout being admitted to the hospital with intractable left foot pain and inability to bear weight.  He is followed by [facility] and on chronic Lyrica  as well as suzetrigine, per their documentation he has chronic left lower extremity pain typically 6 out of 10 despite medications.  He presented to orthopedic urgent care on 8/9 with approximately 4 days of tingling swelling and redness of the left great toe, he did stub his toe prior to initiation of the pain.  He thought it was broken, he does not have any prior history of gout.  He was diagnosed with acute gout, prescribed colchicine  and Indocin .  He went to see his PCP on 8/11, they discontinued his colchicine  and NSAID due to chronic GFR of 50, instead prescribed prednisone  taper which he has been taking.  Despite all of these interventions, his pain did not improve, he has had severe pain in the left great toe making it impossible for him to ambulate.  He was evaluated in the ER, lab work is relatively unremarkable, he was given IV Solu-Medrol , IV Dilaudid  and admitted to the hospitalist service for pain control.  Review of Systems: Please see HPI for pertinent positives and negatives. A complete 10 system review of systems are otherwise negative.  Past Medical History:  Diagnosis Date   Arthritis    Depression 12/2021   The noted that he thinks i have Depression and possibly PTSD from my fall in July.  We are working on [employer] counseling now.   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 He has never used smokeless tobacco. He reports that he does not drink alcohol and does not use drugs.  No Known Allergies  Family History  Problem Relation Age of Onset   Alcohol abuse Mother    Alcohol abuse Father    Arthritis Sister    Diabetes Sister    Obesity Sister    Arthritis Sister    Depression Sister    Arthritis Brother    Depression Sister    Diabetes Sister    Obesity Sister      Prior to Admission medications   Medication Sig Start Date End Date Taking? Authorizing Provider  indomethacin  (INDOCIN  SR) 75 MG CR capsule Take 1 capsule twice a day by oral route with meal(s). 12/20/23  Yes [provider]  meclizine  (ANTIVERT ) 25 MG tablet Take 1 tablet (25 mg total) by mouth 3 (three) times daily as needed for dizziness. Patient taking differently: Take 25 mg by mouth daily as needed for dizziness. 12/25/22  Yes McElwee, Lauren A, NP  pregabalin  (LYRICA ) 100 MG capsule Take 100 mg by mouth in the morning, at noon, in the evening, and at bedtime.   Yes [provider]  ZEPBOUND  7.5 MG/0.5ML injection vial INJECT 0.5 ML (7.5 MG) UNDER THE SKIN ONCE WEEKLY (0.5ML= 50 UNITS) 10/17/23  Yes McElwee, Lauren A, NP  colchicine  0.6 MG tablet Take 1 tablet every day by oral route with meal(s). Patient not taking: Reported on 12/30/2023 12/20/23   [provider]  predniSONE  (STERAPRED UNI-PAK 21 TAB) 10 MG (21) TBPK tablet Take as directed on package Patient not taking: Reported on 12/30/2023 12/22/23   Waddell Krabbe, PA-C    Physical Exam: BP (!) 150/86 (BP Location: Right Arm)   Pulse 83   Temp 98.8 F (37.1 C) (Oral)   Resp 16   Ht  5' 10 (1.778 m)   Wt 95 kg   SpO2 99%   BMI 30.05 kg/m  General: Patient sleeping on my arrival, easily arousable, he is alert, oriented, calm, in no acute distress.  Currently he has no pain. Cardiovascular: RRR, no murmurs or rubs, no peripheral edema  Respiratory: clear to auscultation bilaterally, no wheezes, no crackles  Abdomen: soft, nontender, nondistended, normal bowel tones heard  Skin: dry, no rashes  Musculoskeletal: He has obvious erythema and swelling at the base of the left great toe, with excruciating pain with any palpation or movement of the joint.  No areas of fluctuance, no streaking erythema.  No significant lower extremity swelling. Psychiatric: appropriate affect, normal speech  Neurologic: extraocular muscles intact, clear speech, moving all extremities with intact sensorium         Labs on Admission:  Basic Metabolic Panel: Recent Labs  Lab 12/30/23 0333  NA 136  K 3.9  CL 99  CO2 25  GLUCOSE 103*  BUN 19  CREATININE 1.40*  CALCIUM  9.2   Liver Function Tests: No results for input(s): AST, ALT, ALKPHOS, BILITOT, PROT, ALBUMIN in the last 168 hours. No results for input(s): LIPASE, AMYLASE in the last 168 hours. No results for input(s): AMMONIA in the last 168 hours. CBC: Recent Labs  Lab 12/30/23 0333  WBC 16.5*  NEUTROABS 12.8*  HGB 15.6  HCT 44.9  MCV 87.0  PLT 271   Cardiac Enzymes: No results for input(s): CKTOTAL, CKMB, CKMBINDEX, TROPONINI in the last 168 hours. BNP (last 3 results) No results for input(s): BNP in the last 8760 hours.  ProBNP (last 3 results) No results for input(s): PROBNP in the last 8760 hours.  CBG: No results for input(s): GLUCAP in the last 168 hours.  Radiological Exams on Admission: DG Foot Complete Left Result Date: 12/30/2023 CLINICAL DATA:  Left foot pain due to injury. EXAM: LEFT FOOT - COMPLETE 3+ VIEW COMPARISON:  None Available. FINDINGS: Remote posttraumatic and  postsurgical changes in the distal tibia and fibula. Degenerative changes in the 1st MTP joint with joint space narrowing and spurring. No acute bony abnormality. Specifically, no acute fracture, subluxation, or dislocation. Soft tissues are intact. IMPRESSION: No acute bony abnormality. Electronically Signed   By: Franky Crease M.D.   On: 12/30/2023 00:03   Assessment/Plan DAYVIN ABER is a 63 y.o. male with medical history significant for complex regional left lower extremity pain followed by pain management and recent diagnosis of gout being admitted to the hospital with intractable left foot pain and inability to bear weight.  Acute flare of gout with intractable pain-doubt acute infection, as x-ray without any acute abnormality, patient without fever or other infectious symptoms, and low risk for septic arthritis.  I think his pain is greater than expected due to his history of complex  regional pain syndrome. -Observation admission -Received IV Solu-Medrol , plan on p.o. prednisone  from the morning -Continue to avoid NSAIDs due to CKD -Pain control with p.o. oxycodone , and IV Dilaudid  for severe breakthrough pain  Chronic left lower extremity pain-continue home Lyrica    CKD stage III-with GFR 50, monitor renal function closely in house  DVT prophylaxis: Lovenox      Code Status: Full Code  Consults called: None  Admission status: Observation  Time spent: 49 minutes  Nancylee Gaines CHRISTELLA Gail MD [facility] Pager [phone]  If 7PM-7AM, please contact night-coverage [email or web address] Password [facility]  12/30/2023, 12:22 PM

## 2023-12-30 NOTE — ED Provider Notes (Signed)
 [place] EMERGENCY DEPARTMENT AT [facility] Provider Note   CSN: [id number] Arrival date & time: 12/29/23  2311     Patient presents with: No chief complaint on file.   Kenneth Calhoun is a 63 y.o. male.   Patient is a 63 year old male with history of prior left tibia fracture, hyperlipidemia, chronic renal insufficiency.  Patient presenting today with complaints of severe pain in his left foot.  This pain has been present for nearly 1 week and is worsening.  He was seen by his primary doctor who suspected gout.  He reports being treated with prednisone  which did not seem to help, then with indomethacin  which he started taking recently.  Neither seems to be helping.  He describes severe pain and inability to bear weight or move his toe.  No fevers or chills.  No new injury or trauma.       Prior to Admission medications   Medication Sig Start Date End Date Taking? Authorizing Provider  colchicine  0.6 MG tablet Take 1 tablet every day by oral route with meal(s). 12/20/23   [provider]  indomethacin  (INDOCIN  SR) 75 MG CR capsule Take 1 capsule twice a day by oral route with meal(s). 12/20/23   [provider]  meclizine  (ANTIVERT ) 25 MG tablet Take 1 tablet (25 mg total) by mouth 3 (three) times daily as needed for dizziness. Patient not taking: Reported on 12/22/2023 12/25/22   Nedra Tinnie LABOR, NP  predniSONE  (STERAPRED UNI-PAK 21 TAB) 10 MG (21) TBPK tablet Take as directed on package 12/22/23   Waddell Krabbe, PA-C  pregabalin  (LYRICA ) 75 MG capsule Take 1 capsule (75 mg total) by mouth 2 (two) times daily. 04/25/23   McElwee, Lauren A, NP  ZEPBOUND  7.5 MG/0.5ML injection vial INJECT 0.5 ML (7.5 MG) UNDER THE SKIN ONCE WEEKLY (0.5ML= 50 UNITS) 10/17/23   McElwee, Tinnie LABOR, NP    Allergies: Patient has no known allergies.    Review of Systems  All other systems reviewed and are negative.   Updated Vital Signs BP (!) 143/68 (BP Location: Left Arm)    Pulse 92   Temp 98.8 F (37.1 C) (Oral)   Resp 18   SpO2 98%   Physical Exam Vitals and nursing note reviewed.  Constitutional:      Appearance: Normal appearance.  HENT:     Head: Normocephalic.  Pulmonary:     Effort: Pulmonary effort is normal.  Musculoskeletal:     Comments: The first MTP joint is swollen and erythematous.  There is severe pain with any touch or range of motion.  Skin:    General: Skin is warm and dry.  Neurological:     Mental Status: He is alert and oriented to person, place, and time.     (all labs ordered are listed, but only abnormal results are displayed) Labs Reviewed - No data to display  EKG: None  Radiology: DG Foot Complete Left Result Date: 12/30/2023 CLINICAL DATA:  Left foot pain due to injury. EXAM: LEFT FOOT - COMPLETE 3+ VIEW COMPARISON:  None Available. FINDINGS: Remote posttraumatic and postsurgical changes in the distal tibia and fibula. Degenerative changes in the 1st MTP joint with joint space narrowing and spurring. No acute bony abnormality. Specifically, no acute fracture, subluxation, or dislocation. Soft tissues are intact. IMPRESSION: No acute bony abnormality. Electronically Signed   By: Franky Crease M.D.   On: 12/30/2023 00:03     Procedures   Medications Ordered in the ED  HYDROmorphone  (DILAUDID ) injection 2 mg (has no administration in time range)                                    Medical Decision Making Amount and/or Complexity of Data Reviewed Labs: ordered. Radiology: ordered.  Risk Prescription drug management. Decision regarding hospitalization.   Patient is a 63 year old male presenting with complaints of severe left foot pain.  He had been previously diagnosed with gout, but got no improvement with prednisone .  He was just started on indomethacin  and has taken several doses of this, but his pain has become too great that he cannot deal with it.  Patient arrives here in severe discomfort screaming every  time he moves his foot.  Vital signs are stable and patient is afebrile.  On exam, he does have redness and erythema over the MTP joint of his first toe.  Laboratory studies obtained including CBC and basic metabolic panel.  White count is 16.5 and metabolic panel unremarkable.  Lactate is normal.  Sed rate is 30.  X-rays show degenerative changes in the first MTP joint, but nothing acute otherwise.  Patient has received multiple doses of Dilaudid , but continues to scream in pain.  I have also given IV Solu-Medrol  with little relief.  As I have been unable to control the patient's pain, I feel as though admission will be required.  I have discussed with the hospitalist who agrees to admit.     Final diagnoses:  None    ED Discharge Orders     None          Geroldine Berg, MD 12/30/23 [phone]

## 2023-12-30 NOTE — Plan of Care (Signed)
 Plan of Care Note for accepted transfer  Patient: Kenneth Calhoun    FMW:[id number]  DOA: 12/30/2023     Facility requesting transfer: [facility] ED  Requesting Provider: Geroldine Berg, MD  Reason for transfer: Gout  Facility course:   79 M with hx complex regional pain L leg, lumbosacral radiculopathy, HLD, vertigo. P/w L foot / toe pain. [facility]ortedly [facility] by [facility] on Saturday 8/9 and diagnosed with gout Rx for Colchicine , indomethacin . Seen by [facility] primary care on 8/11 and given Solumedrol + Pred dose pack. Was planned to f/u in 4 weeks. Returned with severe pain, inability to bear weight. Given solumedrol, dilaudid  IV in the ED. Will bring in for obs d/t severe pain, inability to bear weight   Plan of care: The patient is accepted for admission to Med-surg  unit, at [facility].    Author: Dorn Dawson, MD  12/30/2023  Check [email or web address] for on-call coverage.  Nursing staff, Please call [facility] Admits & Consults System-Wide number on Amion as soon as patient's arrival, so appropriate admitting provider can evaluate the pt.

## 2023-12-30 NOTE — Plan of Care (Signed)

## 2023-12-31 ENCOUNTER — Inpatient Hospital Stay (HOSPITAL_COMMUNITY)

## 2023-12-31 DIAGNOSIS — G90522 Complex regional pain syndrome I of left lower limb: Secondary | ICD-10-CM | POA: Diagnosis present

## 2023-12-31 DIAGNOSIS — Z79899 Other long term (current) drug therapy: Secondary | ICD-10-CM | POA: Diagnosis not present

## 2023-12-31 DIAGNOSIS — M869 Osteomyelitis, unspecified: Secondary | ICD-10-CM | POA: Diagnosis not present

## 2023-12-31 DIAGNOSIS — B9561 Methicillin susceptible Staphylococcus aureus infection as the cause of diseases classified elsewhere: Secondary | ICD-10-CM | POA: Diagnosis not present

## 2023-12-31 DIAGNOSIS — M00072 Staphylococcal arthritis, left ankle and foot: Secondary | ICD-10-CM | POA: Diagnosis not present

## 2023-12-31 DIAGNOSIS — Z818 Family history of other mental and behavioral disorders: Secondary | ICD-10-CM | POA: Diagnosis not present

## 2023-12-31 DIAGNOSIS — M109 Gout, unspecified: Secondary | ICD-10-CM | POA: Diagnosis present

## 2023-12-31 DIAGNOSIS — E782 Mixed hyperlipidemia: Secondary | ICD-10-CM | POA: Diagnosis present

## 2023-12-31 DIAGNOSIS — Z8261 Family history of arthritis: Secondary | ICD-10-CM | POA: Diagnosis not present

## 2023-12-31 DIAGNOSIS — Z833 Family history of diabetes mellitus: Secondary | ICD-10-CM | POA: Diagnosis not present

## 2023-12-31 DIAGNOSIS — R195 Other fecal abnormalities: Secondary | ICD-10-CM | POA: Diagnosis present

## 2023-12-31 DIAGNOSIS — Z811 Family history of alcohol abuse and dependence: Secondary | ICD-10-CM | POA: Diagnosis not present

## 2023-12-31 DIAGNOSIS — N1831 Chronic kidney disease, stage 3a: Secondary | ICD-10-CM | POA: Diagnosis present

## 2023-12-31 DIAGNOSIS — D72829 Elevated white blood cell count, unspecified: Secondary | ICD-10-CM | POA: Diagnosis present

## 2023-12-31 DIAGNOSIS — M79675 Pain in left toe(s): Secondary | ICD-10-CM | POA: Diagnosis not present

## 2023-12-31 DIAGNOSIS — M868X7 Other osteomyelitis, ankle and foot: Secondary | ICD-10-CM | POA: Diagnosis present

## 2023-12-31 DIAGNOSIS — Z6830 Body mass index (BMI) 30.0-30.9, adult: Secondary | ICD-10-CM | POA: Diagnosis not present

## 2023-12-31 DIAGNOSIS — L02612 Cutaneous abscess of left foot: Secondary | ICD-10-CM | POA: Diagnosis present

## 2023-12-31 DIAGNOSIS — M86172 Other acute osteomyelitis, left ankle and foot: Secondary | ICD-10-CM | POA: Diagnosis not present

## 2023-12-31 DIAGNOSIS — T380X5A Adverse effect of glucocorticoids and synthetic analogues, initial encounter: Secondary | ICD-10-CM | POA: Diagnosis present

## 2023-12-31 DIAGNOSIS — E669 Obesity, unspecified: Secondary | ICD-10-CM | POA: Diagnosis present

## 2023-12-31 DIAGNOSIS — M10072 Idiopathic gout, left ankle and foot: Secondary | ICD-10-CM | POA: Diagnosis present

## 2023-12-31 LAB — CBC
HCT: 46 % (ref 39.0–52.0)
Hemoglobin: 15.3 g/dL (ref 13.0–17.0)
MCH: 29.6 pg (ref 26.0–34.0)
MCHC: 33.3 g/dL (ref 30.0–36.0)
MCV: 89 fL (ref 80.0–100.0)
Platelets: 303 K/uL (ref 150–400)
RBC: 5.17 MIL/uL (ref 4.22–5.81)
RDW: 12.7 % (ref 11.5–15.5)
WBC: 17.3 K/uL — ABNORMAL HIGH (ref 4.0–10.5)
nRBC: 0 % (ref 0.0–0.2)

## 2023-12-31 LAB — BASIC METABOLIC PANEL WITH GFR
Anion gap: 8 (ref 5–15)
BUN: 21 mg/dL (ref 8–23)
CO2: 27 mmol/L (ref 22–32)
Calcium: 8.7 mg/dL — ABNORMAL LOW (ref 8.9–10.3)
Chloride: 101 mmol/L (ref 98–111)
Creatinine, Ser: 1.23 mg/dL (ref 0.61–1.24)
GFR, Estimated: >60 mL/min (ref >60)
Glucose, Bld: 105 mg/dL — ABNORMAL HIGH (ref 70–99)
Potassium: 4.3 mmol/L (ref 3.5–5.1)
Sodium: 136 mmol/L (ref 135–145)

## 2023-12-31 LAB — HIV ANTIBODY (ROUTINE TESTING W REFLEX): HIV Screen 4th Generation wRfx: NONREACTIVE

## 2023-12-31 MED ORDER — MELATONIN 5 MG PO TABS
5.0000 mg | ORAL_TABLET | Freq: Every evening | ORAL | Status: AC | PRN
  Administered 2023-12-31 – 2024-01-04 (×4): 5 mg via ORAL
  Filled 2023-12-31 (×4): qty 1

## 2023-12-31 MED ORDER — GADOBUTROL 1 MMOL/ML IV SOLN
9.0000 mL | Freq: Once | INTRAVENOUS | Status: AC | PRN
  Administered 2023-12-31: 9 mL

## 2023-12-31 MED ORDER — METHYLPREDNISOLONE SODIUM SUCC 40 MG IJ SOLR
40.0000 mg | Freq: Every day | INTRAMUSCULAR | Status: DC
  Administered 2023-12-31 – 2024-01-08 (×9): 40 mg via INTRAVENOUS
  Filled 2023-12-31 (×9): qty 1

## 2023-12-31 MED ORDER — METHYLPREDNISOLONE SODIUM SUCC 40 MG IJ SOLR: 40.0000 mg | Freq: Two times a day (BID) | INTRAMUSCULAR | Status: DC

## 2023-12-31 NOTE — Progress Notes (Signed)
 PROGRESS NOTE    Kenneth Calhoun  FMW:[id number] DOB: [birth year] DOA: 12/30/2023 PCP: Nedra Tinnie LABOR, NP    Brief Narrative:  Kenneth Calhoun is a 63 y.o. male with past medical history of complex regional left lower extremity pain, being followed by pain management and recent diagnosis of gout to the hospital with intractable left foot pain and inability to bear weight.  He is followed by [facility] and is on chronic Lyrica  as well as suzetrigine, as per the documentation  he has chronic left lower extremity pain typically 6 out of 10 despite medications.  He presented to orthopedic urgent care on 8/9 with approximately 4 days of tingling, swelling and redness of the left great toe. He was diagnosed with acute gout, prescribed colchicine  and Indocin .  He went to see his PCP on 8/11, they discontinued his colchicine  and NSAID due to chronic GFR of 50, instead prescribed prednisone  taper which patient had been taking.  Despite all of these interventions, his pain did not improve, and had difficulty ambulating so he again presented to the ED.  In the ED, patient was given IV Solu-Medrol  Dilaudid  and was admitted hospital for pain control.    Assessment and plan  Acute flare of gout with intractable pain- X-ray without any acute abnormality.  No fever or systemic signs of infection.  Likely his pain is greater than expected due to his history of complex regional pain syndrome but has not received any relief in his constant severe pain as per the patient.  Is been on Dilaudid  IV with last for short-term.  Received IV Solu-Medrol  we will continue with that..  Not on NSAIDs due to CKD.  Pain control with p.o. oxycodone  and IV Dilaudid  for breakthrough pain.  ESR elevated at 30.  Since patient has disproportionate pain and does not seem to be improving will reach out to orthopedic team.  Mild leukocytosis likely reactive secondary to steroids.   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  Patient does not say that he takes any narcotic at home.   CKD stage IIIa-with GFR 50, monitor renal function closely in house.  Latest creatinine of 1.2.     DVT prophylaxis: enoxaparin  (LOVENOX ) injection 40 mg Start: 12/30/23 1200   Code Status:     Code Status: Full Code  Disposition: Home when improved Status is: Observation The patient will require care spanning > 2 midnights and should be moved to inpatient because: Intractable pain, IV narcotics, pending clinical improvement   Family Communication: Spoke with the patient's son at bedside  Consultants:  Will reach out to orthopedics  Procedures:  None  Antimicrobials:  None  Anti-infectives (From admission, onward)    None        Subjective: Today, patient was seen and examined at bedside.  Planes of intractable left great toe foot pain with swelling and redness which has not improved.  States that the pain is pretty much constant and feels throbbing not helped much with current pain medication regimen.  Objective: Vitals:   12/30/23 1324 12/30/23 1652 12/30/23 2025 12/31/23 0457  BP: 132/75 125/74 135/78 121/85  Pulse: 80 68 73 87  Resp: 16 16 18    Temp: 97.7 F (36.5 C) 97.7 F (36.5 C) 97.7 F (36.5 C)   TempSrc: Oral     SpO2: 98% 95% 98% 97%  Weight:      Height:        Intake/Output Summary (Last 24  hours) at 12/31/2023 1128 Last data filed at 12/31/2023 0900 Gross per 24 hour  Intake 360 ml  Output 900 ml  Net -540 ml   Filed Weights   12/30/23 0850  Weight: 95 kg    Physical Examination: Body mass index is 30.05 kg/m.  General:  Average built, not in obvious distress, in mild distress due to pain HENT:   No scleral pallor or icterus noted. Oral mucosa is moist.  Chest:  Clear breath sounds.  . No crackles or wheezes.  CVS: S1 &S2 heard. No murmur.  Regular rate and rhythm. Abdomen: Soft, nontender, nondistended.  Bowel sounds are heard.    Extremities: No cyanosis, clubbing, left great toe area with significant erythema edema and tenderness on palpation, dorsal and plantar foot also edematous.  Peripheral pulses are palpable. Psych: Alert, awake and oriented, normal mood CNS:  No cranial nerve deficits.  Power equal in all extremities.   Skin: Warm and dry.  No rashes noted.       Data Reviewed:   CBC: Recent Labs  Lab 12/30/23 0333 12/31/23 0434  WBC 16.5* 17.3*  NEUTROABS 12.8*  --   HGB 15.6 15.3  HCT 44.9 46.0  MCV 87.0 89.0  PLT 271 303    Basic Metabolic Panel: Recent Labs  Lab 12/30/23 0333 12/31/23 0434  NA 136 136  K 3.9 4.3  CL 99 101  CO2 25 27  GLUCOSE 103* 105*  BUN 19 21  CREATININE 1.40* 1.23  CALCIUM  9.2 8.7*    Liver Function Tests: No results for input(s): AST, ALT, ALKPHOS, BILITOT, PROT, ALBUMIN in the last 168 hours.   Radiology Studies: DG Foot Complete Left Result Date: 12/30/2023 CLINICAL DATA:  Left foot pain due to injury. EXAM: LEFT FOOT - COMPLETE 3+ VIEW COMPARISON:  None Available. FINDINGS: Remote posttraumatic and postsurgical changes in the distal tibia and fibula. Degenerative changes in the 1st MTP joint with joint space narrowing and spurring. No acute bony abnormality. Specifically, no acute fracture, subluxation, or dislocation. Soft tissues are intact. IMPRESSION: No acute bony abnormality. Electronically Signed   By: Franky Crease M.D.   On: 12/30/2023 00:03      LOS: 0 days    Vernal Alstrom, MD [facility] Available via Epic secure chat 7am-7pm After these hours, please refer to coverage provider listed on [email or web address] 12/31/2023, 11:28 AM

## 2024-01-01 ENCOUNTER — Inpatient Hospital Stay (HOSPITAL_COMMUNITY)

## 2024-01-01 DIAGNOSIS — M10072 Idiopathic gout, left ankle and foot: Secondary | ICD-10-CM | POA: Diagnosis not present

## 2024-01-01 DIAGNOSIS — M79675 Pain in left toe(s): Secondary | ICD-10-CM | POA: Diagnosis not present

## 2024-01-01 LAB — VAS US ABI WITH/WO TBI
Left ABI: 1.17
Right ABI: 1.33

## 2024-01-01 NOTE — Plan of Care (Signed)

## 2024-01-01 NOTE — Consult Note (Addendum)
 ORTHOPAEDIC CONSULTATION  REQUESTING PHYSICIAN: Sonjia Held, MD  PCP:  Nedra Tinnie LABOR, NP  Chief Complaint: Left great toe pain  HPI: Kenneth Calhoun is a 63 y.o. male who complains of  left great toe pain. Patient endorses that he hit his left great toe a few weeks ago while in his garage. He went to [facility] and saw Grayce Remington PA-C. He was diagnosed with an acute gout flare. He then saw his PCP who rx a dose pack which did not help his symptoms. He reports that his foot continued to worsen until he ended up in the hospital for the intractable left foot pain. He reports that is unable to bear weight on the foot. He reports that the pain is constant in duration. He reports no alleviating factors. He reports that weight bearing aggravates his pain. He denies any radiation of his pain. He denies any associated symptoms such as fever, chills, N/V. He does have a PMH significant with complex pain syndrome where he is in pain management for.   Past Medical History:  Diagnosis Date   Arthritis    Depression 12/2021   The noted that he thinks i have Depression and possibly PTSD from my fall in July.  We are working on [employer] counseling now.   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   Past Surgical History:  Procedure Laterality Date   APPENDECTOMY     FRACTURE SURGERY  12/05/2021   OPEN REDUCTION INTERNAL FIXATION (ORIF) TIBIA/FIBULA FRACTURE Left 12/07/2021   Procedure: OPEN REDUCTION INTERNAL FIXATION (ORIF) PILON FRACTURE;  Surgeon: Kendal Franky SQUIBB, MD;  Location: [facility] OR;  Service: Orthopedics;  Laterality: Left;   Social History   Socioeconomic History   Marital status: Married    Spouse name: Not on file   Number of children: Not on file   Years of education: Not on file   Highest education level: 12th grade  Occupational History   Not on file  Tobacco Use   Smoking status: Never   Smokeless tobacco: Never  Vaping Use    Vaping status: Never Used  Substance and Sexual Activity   Alcohol use: No   Drug use: No   Sexual activity: Yes    Birth control/protection: None  Other Topics Concern   Not on file  Social History Narrative   Not on file   Social Drivers of Health   Financial Resource Strain: Low Risk  (12/22/2023)   Overall Financial Resource Strain (CARDIA)    Difficulty of Paying Living Expenses: Not hard at all  Food Insecurity: No Food Insecurity (12/30/2023)   Hunger Vital Sign    Worried About Running Out of Food in the Last Year: Never true    Ran Out of Food in the Last Year: Never true  Transportation Needs: No Transportation Needs (12/30/2023)   PRAPARE - Administrator, Civil Service (Medical): No    Lack of Transportation (Non-Medical): No  Physical Activity: Inactive (12/22/2023)   Exercise Vital Sign    Days of Exercise per Week: 0 days    Minutes of Exercise per Session: Not on file  Stress: No Stress Concern Present (12/22/2023)   [employer] of Occupational Health - Occupational Stress Questionnaire    Feeling of Stress: Only a little  Social Connections: Socially Integrated (12/22/2023)   Social Connection and Isolation Panel    Frequency of Communication with Friends and Family: More than  three times a week    Frequency of Social Gatherings with Friends and Family: Three times a week    Attends Religious Services: 1 to 4 times per year    Active Member of Clubs or Organizations: Yes    Attends Engineer, structural: More than 4 times per year    Marital Status: Married   Family History  Problem Relation Age of Onset   Alcohol abuse Mother    Alcohol abuse Father    Arthritis Sister    Diabetes Sister    Obesity Sister    Arthritis Sister    Depression Sister    Arthritis Brother    Depression Sister    Diabetes Sister    Obesity Sister    No Known Allergies Prior to Admission medications   Medication Sig Start Date End Date Taking?  Authorizing Provider  indomethacin  (INDOCIN  SR) 75 MG CR capsule Take 1 capsule twice a day by oral route with meal(s). 12/20/23  Yes [provider]  meclizine  (ANTIVERT ) 25 MG tablet Take 1 tablet (25 mg total) by mouth 3 (three) times daily as needed for dizziness. Patient taking differently: Take 25 mg by mouth daily as needed for dizziness. 12/25/22  Yes McElwee, Lauren A, NP  pregabalin  (LYRICA ) 100 MG capsule Take 100 mg by mouth in the morning, at noon, in the evening, and at bedtime.   Yes [provider]  ZEPBOUND  7.5 MG/0.5ML injection vial INJECT 0.5 ML (7.5 MG) UNDER THE SKIN ONCE WEEKLY (0.5ML= 50 UNITS) 10/17/23  Yes McElwee, Lauren A, NP  colchicine  0.6 MG tablet Take 1 tablet every day by oral route with meal(s). Patient not taking: Reported on 12/30/2023 12/20/23   [provider]  predniSONE  (STERAPRED UNI-PAK 21 TAB) 10 MG (21) TBPK tablet Take as directed on package Patient not taking: Reported on 12/30/2023 12/22/23   Waddell Krabbe, PA-C   MR FOOT LEFT W WO CONTRAST Result Date: 01/01/2024 CLINICAL DATA:  Soft tissue infection suspected, foot, xray done EXAM: MRI OF THE LEFT FOREFOOT WITHOUT AND WITH CONTRAST TECHNIQUE: Multiplanar, multisequence MR imaging of the left foot was performed both before and after administration of intravenous contrast. CONTRAST:  9mL GADAVIST  GADOBUTROL  1 MMOL/ML IV SOLN COMPARISON:  Left foot radiographs dated 12/29/2023. FINDINGS: Bones/Joint/Cartilage Moderate arthropathy of the first MTP joint with joint space narrowing, osteophytosis, and subchondral cystic change. No discrete erosion identified. Periarticular marrow edema about the first MTP joint involving the head of the first metatarsal and the base of the first proximal phalanx with corresponding T1 hypointensity and enhancement on postcontrast sequences. First MTP joint effusion with a 2.2 x 2.0 x 1.1 cm peripherally enhancing fluid-filled cystic outpouching laterally and  additional similar-appearing cystic focus insinuating from the plantar lateral margin of the first MTP joint measuring approximately 1.5 x 1.2 x 0.7 cm (series 7, images 10-15 and series 5, images 24-31). There is surrounding soft tissue edema and enhancement. The remainder of the bones demonstrate normal marrow signal. No fracture or dislocation. Mild degenerative arthropathy at the Lisfranc joint and midfoot. Ligaments Lisfranc ligament is intact. Muscles and Tendons Edema and enhancement of the intrinsic foot musculature, predominantly involving the flexor and adductor hallucis muscles, surrounding the first MTP joint and fluid-filled outpouchings, as described above. No significant tenosynovitis. Soft tissue Subcutaneous edema extending along the dorsal foot. IMPRESSION: Moderate arthropathy of the first MTP joint with periarticular marrow edema involving the head of the first metatarsal and the base of the first proximal  phalanx with corresponding T1 hypointensity and enhancement on postcontrast sequences. No discrete osseous erosion identified. First MTP joint effusion with a 2.2 x 2.0 x 1.1 cm peripherally enhancing fluid-filled cystic outpouching laterally and additional similar-appearing cystic focus insinuating from the plantar lateral margin of the first MTP joint measuring approximately 1.5 x 1.2 x 0.7 cm. There is surrounding soft tissue and intramuscular edema and enhancement. Septic arthritis of the first MTP joint with associated soft tissue infection and possible early osteomyelitis cannot be excluded. Additional differential considerations include inflammatory arthropathy. Recommend correlation with clinical/laboratory findings. Electronically Signed   By: Harrietta Sherry M.D.   On: 01/01/2024 09:12    Positive ROS: All other systems have been reviewed and were otherwise negative with the exception of those mentioned in the HPI and as above.  Physical Exam: General: Alert, no acute  distress Cardiovascular: No pedal edema Respiratory: No cyanosis, no use of accessory musculature GI: No organomegaly, abdomen is soft and non-tender Skin: No lesions in the area of chief complaint Neurologic: Sensation intact distally Psychiatric: Patient is competent for consent with normal mood and affect Lymphatic: No axillary or cervical lymphadenopathy  MUSCULOSKELETAL: The left foot and left great toe is edematous, erythematous, and warm to the touch. Decreased ROM of the left great toe. Pain with palpation and ROM of the left great toe. Plantar flexion and dorsiflexion intact. Sensation is intact. Normal capillary refill of the left great toe.   Assessment: Kenneth Calhoun is a very pleasant 63 year old male with intractable left great toe pain. He has a PMH of complex regional pain symptoms where he is in pain management. He does report trauma of the left great to e a few weeks ago. He states that since then his left great to has been progressively getting worse. We are awaiting MRI results  Plan: We are awaiting the STAT read from the MRI of the left foot to determine if this is a acute gout flare vs osteomyelitis. This will determine the treatment plan    Jeoffrey LOISE Sages, PA-C for Dr. Donaciano Sprang, MD Cell [phone]   Addendum: MRI results back. Dr. Barton, MD at [facility] has provided his recommendations. Plan for primary team to get IR to perform a image guided aspiration of the fluid collections for crystals/gram stain/cultures. If there is no improvement then Dr. Ramanathan will take the patient to the OR on Sunday for I&D.   Dr. Donaciano Sprang, MD has spoke with hospitalist team and the primary team will speak with IR to obtain aspiration and cultures/gram stain/crystals.   Jeoffrey LOISE Sages, PA-C for Dr. Donaciano Sprang, MD Cell ( [phone]  01/01/2024 2:31 PM

## 2024-01-01 NOTE — Progress Notes (Signed)
   01/01/24 0932  TOC Brief Assessment  Insurance and Status Reviewed  Patient has primary care physician Yes  Home environment has been reviewed single family home  Prior level of function: independent  Prior/Current Home Services No current home services  Social Drivers of Health Review SDOH reviewed no interventions necessary  Readmission risk has been reviewed Yes  Transition of care needs transition of care needs identified, TOC will continue to follow    Signed: Heather Saltness, MSW, LCSW Clinical Social Worker Inpatient Care Management 01/01/2024 9:33 AM

## 2024-01-01 NOTE — Care Plan (Signed)
 Orthopaedic Surgery Plan of Care Note   -history and imaging reviewed -pt has left 1st MTP arthralgia secondary to suspected gout flare -PMH includes CRPS LLE as well -please obtain IR guided aspiration of left 1st MTP fluid collection and send for crystals, gram stain and cultures -please keep NPO and hold VTE ppx for tentative surgical I&D left foot on Sun morning 01/04/24 if no clinical improvement by then despite gout rx   Lillia Mountain, MD Orthopaedic Surgery [facility]

## 2024-01-01 NOTE — Progress Notes (Signed)
 ABI exam has been completed.   Results can be found under chart review under CV PROC. 01/01/2024 5:18 PM [place] Kielbasa RVT, RDMS

## 2024-01-01 NOTE — Progress Notes (Signed)
 PROGRESS NOTE    Kenneth Calhoun  FMW:[id number] DOB: 1960-09-06 DOA: 12/30/2023 PCP: Nedra Tinnie LABOR, NP    Brief Narrative:  Kenneth Calhoun is a 63 y.o. male with past medical history of complex regional left lower extremity pain, being followed by pain management but not on narcotics as per patient,  and recent diagnosis of gout presented to the hospital with intractable left foot pain and inability to bear weight.  He is followed by [facility] and is on chronic Lyrica  as well as suzetrigine, as per the documentation  he has chronic left lower extremity pain typically 6 out of 10 despite medications.  He presented to orthopedic urgent care on 8/9 with approximately 4 days of tingling, swelling and redness of the left great toe. He was diagnosed with acute gout, prescribed colchicine  and Indocin .  He went to see his PCP on 8/11, they discontinued his colchicine  and NSAID due to chronic GFR of 50, instead prescribed prednisone  taper which patient had been taking.  Despite all of these interventions, his pain did not improve, and had difficulty ambulating so he again presented to the ED.  In the ED, patient was given IV Solu-Medrol  Dilaudid  and was admitted hospital for pain control.    Assessment and plan  Acute flare of gout with intractable pain- X-ray without any acute abnormality.  No fever or systemic signs of infection.  On IV Dilaudid  and oxycodone  but complains of 6 x 10 pain.  Due to CKD patient was started on IV Solu-Medrol  for gouty flare.   ESR elevated at 30.  Since patient complained of disproportionate pain orthopedics was consulted and MRI of the foot has been done with multiple findings.  Communicated with orthopedics about it and team will review the scan.    Mild leukocytosis likely reactive secondary to steroids.  WBC at 17.3.   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     CKD stage IIIa-with GFR 50, monitor renal  function closely in house.  Latest creatinine of 1.2.     DVT prophylaxis: enoxaparin  (LOVENOX ) injection 40 mg Start: 12/30/23 1200   Code Status:     Code Status: Full Code  Disposition: Home when improved  Status is: Inpatient  The patient is inpatient because: Intractable pain, IV narcotics, pending clinical improvement, orthopedics following   Family Communication: Spoke with the patient's son at bedside on 12/31/2023  Consultants:  Orthopedics  Procedures:  None  Antimicrobials:  None  Anti-infectives (From admission, onward)    None        Subjective: Today, patient was seen and examined at bedside.  Patient still complains of pain and swelling of the foot around 6/10 in intensity.  Denies any fever chills shortness of breath or dyspnea.  Objective: Vitals:   12/31/23 0457 12/31/23 1330 12/31/23 1950 01/01/24 0458  BP: 121/85 137/81 (!) 150/88 (!) 140/96  Pulse: 87 72 77 73  Resp:  16 20 19   Temp:  97.7 F (36.5 C) 97.8 F (36.6 C) 97.9 F (36.6 C)  TempSrc:  Oral Oral Oral  SpO2: 97% 98% 99% 96%  Weight:      Height:        Intake/Output Summary (Last 24 hours) at 01/01/2024 0950 Last data filed at 12/31/2023 1536 Gross per 24 hour  Intake 120 ml  Output --  Net 120 ml   Filed Weights   12/30/23 0850  Weight: 95 kg    Physical Examination: Body  mass index is 30.05 kg/m.  General:  Average built, in mild distress due to pain. HENT:   No scleral pallor or icterus noted. Oral mucosa is moist.  Chest:  Clear breath sounds.  . No crackles or wheezes.  CVS: S1 &S2 heard. No murmur.  Regular rate and rhythm. Abdomen: Soft, nontender, nondistended.  Bowel sounds are heard.   Extremities: No cyanosis, clubbing, left great toe area with significant erythema, edema and tenderness on palpation, dorsal and plantar foot also edematous.  Peripheral pulses are palpable. Psych: Alert, awake and oriented, normal mood CNS:  No cranial nerve deficits.   Moves all extremities Skin: Warm and dry.  No rashes noted.  Pics on 12/31/23       Data Reviewed:   CBC: Recent Labs  Lab 12/30/23 0333 12/31/23 0434  WBC 16.5* 17.3*  NEUTROABS 12.8*  --   HGB 15.6 15.3  HCT 44.9 46.0  MCV 87.0 89.0  PLT 271 303    Basic Metabolic Panel: Recent Labs  Lab 12/30/23 0333 12/31/23 0434  NA 136 136  K 3.9 4.3  CL 99 101  CO2 25 27  GLUCOSE 103* 105*  BUN 19 21  CREATININE 1.40* 1.23  CALCIUM  9.2 8.7*    Liver Function Tests: No results for input(s): AST, ALT, ALKPHOS, BILITOT, PROT, ALBUMIN in the last 168 hours.   Radiology Studies: MR FOOT LEFT W WO CONTRAST Result Date: 01/01/2024 CLINICAL DATA:  Soft tissue infection suspected, foot, xray done EXAM: MRI OF THE LEFT FOREFOOT WITHOUT AND WITH CONTRAST TECHNIQUE: Multiplanar, multisequence MR imaging of the left foot was performed both before and after administration of intravenous contrast. CONTRAST:  9mL GADAVIST  GADOBUTROL  1 MMOL/ML IV SOLN COMPARISON:  Left foot radiographs dated 12/29/2023. FINDINGS: Bones/Joint/Cartilage Moderate arthropathy of the first MTP joint with joint space narrowing, osteophytosis, and subchondral cystic change. No discrete erosion identified. Periarticular marrow edema about the first MTP joint involving the head of the first metatarsal and the base of the first proximal phalanx with corresponding T1 hypointensity and enhancement on postcontrast sequences. First MTP joint effusion with a 2.2 x 2.0 x 1.1 cm peripherally enhancing fluid-filled cystic outpouching laterally and additional similar-appearing cystic focus insinuating from the plantar lateral margin of the first MTP joint measuring approximately 1.5 x 1.2 x 0.7 cm (series 7, images 10-15 and series 5, images 24-31). There is surrounding soft tissue edema and enhancement. The remainder of the bones demonstrate normal marrow signal. No fracture or dislocation. Mild degenerative arthropathy  at the Lisfranc joint and midfoot. Ligaments Lisfranc ligament is intact. Muscles and Tendons Edema and enhancement of the intrinsic foot musculature, predominantly involving the flexor and adductor hallucis muscles, surrounding the first MTP joint and fluid-filled outpouchings, as described above. No significant tenosynovitis. Soft tissue Subcutaneous edema extending along the dorsal foot. IMPRESSION: Moderate arthropathy of the first MTP joint with periarticular marrow edema involving the head of the first metatarsal and the base of the first proximal phalanx with corresponding T1 hypointensity and enhancement on postcontrast sequences. No discrete osseous erosion identified. First MTP joint effusion with a 2.2 x 2.0 x 1.1 cm peripherally enhancing fluid-filled cystic outpouching laterally and additional similar-appearing cystic focus insinuating from the plantar lateral margin of the first MTP joint measuring approximately 1.5 x 1.2 x 0.7 cm. There is surrounding soft tissue and intramuscular edema and enhancement. Septic arthritis of the first MTP joint with associated soft tissue infection and possible early osteomyelitis cannot be excluded. Additional differential considerations include  inflammatory arthropathy. Recommend correlation with clinical/laboratory findings. Electronically Signed   By: Harrietta Sherry M.D.   On: 01/01/2024 09:12      LOS: 1 day    Vernal Alstrom, MD [facility] Available via Epic secure chat 7am-7pm After these hours, please refer to coverage provider listed on [email or web address] 01/01/2024, 9:50 AM

## 2024-01-02 DIAGNOSIS — M10072 Idiopathic gout, left ankle and foot: Secondary | ICD-10-CM | POA: Diagnosis not present

## 2024-01-02 LAB — CBC
HCT: 44.5 % (ref 39.0–52.0)
Hemoglobin: 14.6 g/dL (ref 13.0–17.0)
MCH: 29.3 pg (ref 26.0–34.0)
MCHC: 32.8 g/dL (ref 30.0–36.0)
MCV: 89.2 fL (ref 80.0–100.0)
Platelets: 301 K/uL (ref 150–400)
RBC: 4.99 MIL/uL (ref 4.22–5.81)
RDW: 12.4 % (ref 11.5–15.5)
WBC: 15 K/uL — ABNORMAL HIGH (ref 4.0–10.5)
nRBC: 0 % (ref 0.0–0.2)

## 2024-01-02 LAB — BASIC METABOLIC PANEL WITH GFR
Anion gap: 10 (ref 5–15)
BUN: 22 mg/dL (ref 8–23)
CO2: 27 mmol/L (ref 22–32)
Calcium: 8.6 mg/dL — ABNORMAL LOW (ref 8.9–10.3)
Chloride: 98 mmol/L (ref 98–111)
Creatinine, Ser: 1.22 mg/dL (ref 0.61–1.24)
GFR, Estimated: >60 mL/min (ref >60)
Glucose, Bld: 97 mg/dL (ref 70–99)
Potassium: 3.9 mmol/L (ref 3.5–5.1)
Sodium: 135 mmol/L (ref 135–145)

## 2024-01-02 MED ORDER — PANTOPRAZOLE SODIUM 40 MG PO TBEC
40.0000 mg | DELAYED_RELEASE_TABLET | Freq: Every day | ORAL | Status: DC
  Administered 2024-01-02 – 2024-01-08 (×7): 40 mg via ORAL
  Filled 2024-01-02 (×7): qty 1

## 2024-01-02 MED ORDER — COLCHICINE 0.6 MG PO TABS
0.6000 mg | ORAL_TABLET | Freq: Once | ORAL | Status: AC
  Administered 2024-01-02: 0.6 mg via ORAL
  Filled 2024-01-02: qty 1

## 2024-01-02 NOTE — Progress Notes (Addendum)
 PROGRESS NOTE    Kenneth Calhoun  FMW:[id number] DOB: Apr 03, 1961 DOA: 12/30/2023 PCP: Nedra Tinnie LABOR, NP    Brief Narrative:   Kenneth Calhoun is a 63 y.o. male with past medical history of complex regional left lower extremity pain, being followed by pain management but not on narcotics as per patient,  and recent diagnosis of gout presented to the hospital with intractable left foot pain and inability to bear weight.  He is followed by [facility] and is on chronic Lyrica  as well as suzetrigine, as per the documentation  he has chronic left lower extremity pain typically 6 out of 10 despite medications.  He presented to orthopedic urgent care on 8/9 with approximately 4 days of tingling, swelling and redness of the left great toe. He was diagnosed with acute gout, prescribed colchicine  and Indocin .  He went to see his PCP on 8/11, they discontinued his colchicine  and NSAID due to chronic GFR of 50, instead prescribed prednisone  taper which patient had been taking.  Despite all of these interventions, his pain did not improve, and had difficulty ambulating so he again presented to the ED.  In the ED, patient was given IV Solu-Medrol  Dilaudid  and was admitted hospital for pain control.    Assessment and plan  Acute flare of gout with intractable pain- X-ray without any acute abnormality.  No fever or systemic signs of infection.  On IV Dilaudid  and oxycodone  but complains of pain especially on weightbearing.  Due to CKD patient was started on IV Solu-Medrol  for gouty flare.  Currently on Solu-Medrol  40 mg daily.   ESR elevated at 30.  Since patient complained of disproportionate pain orthopedics was consulted and MRI of the foot has been done with multiple findings..  Communicated with orthopedics and interventional radiology yesterday for possible aspiration of the fluid collection to rule out infections.  IR reached out to me and stated that aspiration could be done by orthopedics.   Reached out to Dr. Barton, orthopedics to inform about that.  Will add 1 dose of 0.6 mg colchicine  today.  Mild leukocytosis likely reactive secondary to steroids.  WBC at 15.0 from 17.3.   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     CKD stage IIIa-with GFR 50, monitor renal function closely in house.  Latest creatinine of 1.2.   Black stool with 1 episode yesterday.  Unsure about it.  Will get stool occult blood.  Will closely monitor.  Check CBC in AM.  Add Protonix .    DVT prophylaxis: enoxaparin  (LOVENOX ) injection 40 mg Start: 12/30/23 1200   Code Status:     Code Status: Full Code  Disposition: Home when improved  Status is: Inpatient  The patient is inpatient because: Intractable pain, IV narcotics, pending clinical improvement, orthopedics following, plan for IR guided aspiration.   Family Communication: Spoke with the patient's son at bedside on 12/31/2023  Consultants:  Orthopedics  Procedures:  None  Antimicrobials:  None  Anti-infectives (From admission, onward)    None        Subjective: Today, patient was seen and examined at bedside.  Patient still complains of foot pain and difficulty bearing weight.  Constantly saying need some help.  Complained of 1 episode of black stool yesterday.  Denies any abdominal pain nausea vomiting and previously of GI bleed.  Objective: Vitals:   01/01/24 0458 01/01/24 1240 01/01/24 1946 01/02/24 0528  BP: (!) 140/96 133/80 138/83 (!) 147/74  Pulse: 73  75 65 74  Resp: 19 16 18 18   Temp: 97.9 F (36.6 C) 97.9 F (36.6 C) 97.9 F (36.6 C) 98.3 F (36.8 C)  TempSrc: Oral Oral  Oral  SpO2: 96% 100% 99% 100%  Weight:      Height:        Intake/Output Summary (Last 24 hours) at 01/02/2024 0911 Last data filed at 01/02/2024 0245 Gross per 24 hour  Intake --  Output 400 ml  Net -400 ml   Filed Weights   12/30/23 0850  Weight: 95 kg    Physical Examination: Body  mass index is 30.05 kg/m.   General:  Average built, alert awake and oriented.SABRA HENT:   No scleral pallor or icterus noted. Oral mucosa is moist.  Chest:  Clear breath sounds.  . No crackles or wheezes.  CVS: S1 &S2 heard. No murmur.  Regular rate and rhythm. Abdomen: Soft, nontender, nondistended.  Bowel sounds are heard.   Extremities: No cyanosis, clubbing, left great toe area with erythema edema and tenderness on palpation.  Slight decreased edema today. Peripheral pulses are palpable. Psych: Alert, awake and Communicative. CNS:  No cranial nerve deficits.  Moves all extremities Skin: Warm and dry.  No rashes noted.  Pics on 12/31/23       Data Reviewed:   CBC: Recent Labs  Lab 12/30/23 0333 12/31/23 0434 01/02/24 0425  WBC 16.5* 17.3* 15.0*  NEUTROABS 12.8*  --   --   HGB 15.6 15.3 14.6  HCT 44.9 46.0 44.5  MCV 87.0 89.0 89.2  PLT 271 303 301    Basic Metabolic Panel: Recent Labs  Lab 12/30/23 0333 12/31/23 0434 01/02/24 0425  NA 136 136 135  K 3.9 4.3 3.9  CL 99 101 98  CO2 25 27 27   GLUCOSE 103* 105* 97  BUN 19 21 22   CREATININE 1.40* 1.23 1.22  CALCIUM  9.2 8.7* 8.6*    Liver Function Tests: No results for input(s): AST, ALT, ALKPHOS, BILITOT, PROT, ALBUMIN in the last 168 hours.   Radiology Studies: VAS US  ABI WITH/WO TBI Result Date: 01/01/2024  LOWER EXTREMITY DOPPLER STUDY Patient Name:  Kenneth Calhoun  Date of Exam:   01/01/2024 Medical Rec #: [id number]       Accession #:    [id number] Date of Birth: 02-13-1961       Patient Gender: M Patient Age:   89 years Exam Location:  [facility] Procedure:      VAS US  ABI WITH/WO TBI Referring Phys: --------------------------------------------------------------------------------  Indications: LLE great toe pain (gout) High Risk Factors: Hyperlipidemia, no history of smoking. Other Factors: CKD.  Comparison Study: Previous exam was on 03/29/2022 Performing Technologist: Ezzie Potters RVT, RDMS   Examination Guidelines: A complete evaluation includes at minimum, Doppler waveform signals and systolic blood pressure reading at the level of bilateral brachial, anterior tibial, and posterior tibial arteries, when vessel segments are accessible. Bilateral testing is considered an integral part of a complete examination. Photoelectric Plethysmograph (PPG) waveforms and toe systolic pressure readings are included as required and additional duplex testing as needed. Limited examinations for reoccurring indications may be performed as noted.  ABI Findings: +---------+------------------+-----+---------+--------+ Right    Rt Pressure (mmHg)IndexWaveform Comment  +---------+------------------+-----+---------+--------+ Brachial 159                    triphasic         +---------+------------------+-----+---------+--------+ PTA      211  1.33 biphasic          +---------+------------------+-----+---------+--------+ DP       169               1.06 biphasic          +---------+------------------+-----+---------+--------+ Great Toe149               0.94 Normal            +---------+------------------+-----+---------+--------+ +---------+------------------+-----+-----------+-------+ Left     Lt Pressure (mmHg)IndexWaveform   Comment +---------+------------------+-----+-----------+-------+ Brachial 145                    triphasic          +---------+------------------+-----+-----------+-------+ PTA      186               1.17 multiphasic        +---------+------------------+-----+-----------+-------+ DP       165               1.04 multiphasic        +---------+------------------+-----+-----------+-------+ Great Toe111               0.70 Normal             +---------+------------------+-----+-----------+-------+ +-------+-----------+-----------+------------+------------+ ABI/TBIToday's ABIToday's TBIPrevious ABIPrevious TBI  +-------+-----------+-----------+------------+------------+ Right  1.33       0.94       1.24        0.86         +-------+-----------+-----------+------------+------------+ Left   1.17       0.70       1.06        0.66         +-------+-----------+-----------+------------+------------+   Summary: Right: Resting right ankle-brachial index indicates noncompressible right lower extremity arteries. The right toe-brachial index is normal. Left: Resting left ankle-brachial index is within normal range. The left toe-brachial index is normal. *See table(s) above for measurements and observations.  Electronically signed by Debby Robertson on 01/01/2024 at 9:17:44 PM.    Final    MR FOOT LEFT W WO CONTRAST Result Date: 01/01/2024 CLINICAL DATA:  Soft tissue infection suspected, foot, xray done EXAM: MRI OF THE LEFT FOREFOOT WITHOUT AND WITH CONTRAST TECHNIQUE: Multiplanar, multisequence MR imaging of the left foot was performed both before and after administration of intravenous contrast. CONTRAST:  9mL GADAVIST  GADOBUTROL  1 MMOL/ML IV SOLN COMPARISON:  Left foot radiographs dated 12/29/2023. FINDINGS: Bones/Joint/Cartilage Moderate arthropathy of the first MTP joint with joint space narrowing, osteophytosis, and subchondral cystic change. No discrete erosion identified. Periarticular marrow edema about the first MTP joint involving the head of the first metatarsal and the base of the first proximal phalanx with corresponding T1 hypointensity and enhancement on postcontrast sequences. First MTP joint effusion with a 2.2 x 2.0 x 1.1 cm peripherally enhancing fluid-filled cystic outpouching laterally and additional similar-appearing cystic focus insinuating from the plantar lateral margin of the first MTP joint measuring approximately 1.5 x 1.2 x 0.7 cm (series 7, images 10-15 and series 5, images 24-31). There is surrounding soft tissue edema and enhancement. The remainder of the bones demonstrate normal marrow  signal. No fracture or dislocation. Mild degenerative arthropathy at the Lisfranc joint and midfoot. Ligaments Lisfranc ligament is intact. Muscles and Tendons Edema and enhancement of the intrinsic foot musculature, predominantly involving the flexor and adductor hallucis muscles, surrounding the first MTP joint and fluid-filled outpouchings, as described above. No significant tenosynovitis. Soft tissue Subcutaneous edema extending along the dorsal foot. IMPRESSION:  Moderate arthropathy of the first MTP joint with periarticular marrow edema involving the head of the first metatarsal and the base of the first proximal phalanx with corresponding T1 hypointensity and enhancement on postcontrast sequences. No discrete osseous erosion identified. First MTP joint effusion with a 2.2 x 2.0 x 1.1 cm peripherally enhancing fluid-filled cystic outpouching laterally and additional similar-appearing cystic focus insinuating from the plantar lateral margin of the first MTP joint measuring approximately 1.5 x 1.2 x 0.7 cm. There is surrounding soft tissue and intramuscular edema and enhancement. Septic arthritis of the first MTP joint with associated soft tissue infection and possible early osteomyelitis cannot be excluded. Additional differential considerations include inflammatory arthropathy. Recommend correlation with clinical/laboratory findings. Electronically Signed   By: Harrietta Sherry M.D.   On: 01/01/2024 09:12      LOS: 2 days    Vernal Alstrom, MD [facility] Available via Epic secure chat 7am-7pm After these hours, please refer to coverage provider listed on [email or web address] 01/02/2024, 9:11 AM

## 2024-01-02 NOTE — Plan of Care (Signed)

## 2024-01-02 NOTE — Progress Notes (Addendum)
 Subjective:  Patient reports pain as controlled. Denies any new numbness/tingling.   He has a history of left ankle ORIF with CRPS. He reports rolling his foot while lawnmowing 2 wk ago and has had great toe pain since then. He has a history of gout. Was seen in orthopaedic urgent care as well as his PCP with diagnosis of gout flare. He has had persistent pain in this left great toe now for at least 2 wk duration. Denies fever/chills/drainage/blistering. Currently admitted to Hospitalist service for workup.  Objective:   VITALS:  Temp:  [97.8 F (36.6 C)-98.3 F (36.8 C)] 97.8 F (36.6 C) (08/22 1212) Pulse Rate:  [65-109] 109 (08/22 1212) Resp:  [18] 18 (08/22 1212) BP: (138-147)/(74-83) 138/80 (08/22 1212) SpO2:  [99 %-100 %] 100 % (08/22 1212)  Gen: AAOx3, NAD Comfortable at rest  Left Lower Extremity: Skin intact, no blistering TTP over 1st MTP with swelling & erythema ADF/APF/EHL 5/5 SILT throughout DP, PT 2+ to palp CR < 2s    LABS Recent Labs    12/31/23 0434 01/02/24 0425  HGB 15.3 14.6  WBC 17.3* 15.0*  PLT 303 301   Recent Labs    12/31/23 0434 01/02/24 0425  NA 136 135  K 4.3 3.9  CL 101 98  CO2 27 27  BUN 21 22  CREATININE 1.23 1.22  GLUCOSE 105* 97   No results for input(s): LABPT, INR in the last 72 hours.   Assessment/Plan: Left great toe 1st MTP joint arthralgia, onset DOI 12/16/23 in setting of gout and CRPS  -reviewed history exam and imaging at length with the patient -at this point, he has had left great toe symptoms for 2+ wk which is only moderately controlled with gout rx -we will plan for left 1st MTP joint I&D on 01/03/24 if no improvement with adjustments made today to his gout regimen -will plan to send intra-op path and micro analysis -please keep NPO and hold VTE ppx at midnight for surgery Sunday morning as first round  Lillia Mountain 01/02/2024, 4:32 PM  The risks and benefits were presented and reviewed. The risks  due to hardware failure/irritation, new/persistent/recurrent infection, stiffness, nerve/vessel/tendon injury, nonunion/malunion of any fracture, wound healing issues, allograft usage, development of arthritis, failure of this surgery, possibility of external fixation in certain situations, possibility of delayed definitive surgery, need for further surgery, prolonged wound care including further soft tissue coverage procedures, thromboembolic events, anesthesia/medical complications/events perioperatively and beyond, amputation, death among others were discussed. The patient acknowledged the explanation, agreed to proceed with the plan.

## 2024-01-03 DIAGNOSIS — M10072 Idiopathic gout, left ankle and foot: Secondary | ICD-10-CM | POA: Diagnosis not present

## 2024-01-03 LAB — CBC
HCT: 43.4 % (ref 39.0–52.0)
Hemoglobin: 14.8 g/dL (ref 13.0–17.0)
MCH: 30.1 pg (ref 26.0–34.0)
MCHC: 34.1 g/dL (ref 30.0–36.0)
MCV: 88.4 fL (ref 80.0–100.0)
Platelets: 268 K/uL (ref 150–400)
RBC: 4.91 MIL/uL (ref 4.22–5.81)
RDW: 12.1 % (ref 11.5–15.5)
WBC: 11.8 K/uL — ABNORMAL HIGH (ref 4.0–10.5)
nRBC: 0 % (ref 0.0–0.2)

## 2024-01-03 LAB — BASIC METABOLIC PANEL WITH GFR
Anion gap: 8 (ref 5–15)
BUN: 22 mg/dL (ref 8–23)
CO2: 29 mmol/L (ref 22–32)
Calcium: 8.5 mg/dL — ABNORMAL LOW (ref 8.9–10.3)
Chloride: 99 mmol/L (ref 98–111)
Creatinine, Ser: 1.21 mg/dL (ref 0.61–1.24)
GFR, Estimated: >60 mL/min (ref >60)
Glucose, Bld: 94 mg/dL (ref 70–99)
Potassium: 3.8 mmol/L (ref 3.5–5.1)
Sodium: 136 mmol/L (ref 135–145)

## 2024-01-03 NOTE — Progress Notes (Signed)
 PROGRESS NOTE    Kenneth Calhoun  FMW:[id number] DOB: 11/16/1960 DOA: 12/30/2023 PCP: Nedra Tinnie LABOR, NP    Brief Narrative:   Kenneth Calhoun is a 63 y.o. male with past medical history of complex regional left lower extremity pain, being followed by pain management but not on narcotics as per patient,  and recent diagnosis of gout presented to the hospital with intractable left foot pain and inability to bear weight.  He is followed by [facility] and is on chronic Lyrica  as well as suzetrigine, as per the documentation  he has chronic left lower extremity pain typically 6 out of 10 despite medications.  He presented to orthopedic urgent care on 8/9 with approximately 4 days of tingling, swelling and redness of the left great toe. He was diagnosed with acute gout, prescribed colchicine  and Indocin .  He went to see his PCP on 8/11, they discontinued his colchicine  and NSAID due to chronic GFR of 50, instead prescribed prednisone  taper which patient had been taking.  Despite all of these interventions, his pain did not improve, and had difficulty ambulating so he again presented to the ED.  In the ED, patient was given IV Solu-Medrol  Dilaudid  and was admitted hospital for pain control.    Assessment and plan Principal Problem:   Gout attack Active Problems:   Obesity (BMI 30-39.9)   Mixed hyperlipidemia   CKD stage 3a, GFR 45-59 ml/min (HCC)    Acute flare of gout with intractable pain- X-ray of the foot without any acute abnormality.  No fever or systemic signs of infection.  Still complains of pain.  Received additional dose of colchicine  yesterday.  Seen by orthopedics.  On IV Solu-Medrol  40 daily due to CKD.  ESR elevated at 30.   MRI of the foot has been done with multiple findings..  Communicated with orthopedics and interventional radiology and at this time orthopedic has plans for surgical intervention likely tomorrow if not improved.  Mild leukocytosis likely reactive  secondary to steroids.  WBC at 11.8 from initial 17.3.   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     CKD stage IIIa-with GFR 50, monitor renal function closely in house.  Latest creatinine of 1.2.   Black stool with 1 episode yesterday.  Unsure about it.  Pending stool occult blood.  Will closely monitor.  Check CBC in AM.  Continue Protonix     DVT prophylaxis:  Refusing Lovenox   Code Status:     Code Status: Full Code  Disposition: Home when improved, and okay with orthopedics likely in 1 to 2 days  Status is: Inpatient  The patient is inpatient because: Intractable pain, IV narcotics, pending clinical improvement, orthopedics following, plan for possible I and D.   Family Communication: Spoke with the patient's son at bedside on 12/31/2023  Consultants:  Orthopedics  Procedures:  None  Antimicrobials:  None  Anti-infectives (From admission, onward)    None        Subjective: Today, patient was seen and examined at bedside.  Patient still complains of foot pain and difficulty weightbearing.  Denies any fever, chills, shortness of breath, dyspnea or chest pain.    Objective: Vitals:   01/02/24 1212 01/02/24 1932 01/03/24 0440 01/03/24 1133  BP: 138/80 (!) 143/85 (!) 149/78 138/75  Pulse: (!) 109 74 68 76  Resp: 18 16 18 18   Temp: 97.8 F (36.6 C) 97.8 F (36.6 C) 98.1 F (36.7 C) (!) 97.4 F (36.3 C)  TempSrc: Oral   Oral  SpO2: 100% 99% 97% 98%  Weight:      Height:        Intake/Output Summary (Last 24 hours) at 01/03/2024 1538 Last data filed at 01/03/2024 0930 Gross per 24 hour  Intake 480 ml  Output --  Net 480 ml   Filed Weights   12/30/23 0850  Weight: 95 kg    Physical Examination: Body mass index is 30.05 kg/m.   General:  Average built, alert awake and oriented.  Communicative. HENT:   No scleral pallor or icterus noted. Oral mucosa is moist.  Chest:  Clear breath sounds.  . No crackles or  wheezes.  CVS: S1 &S2 heard. No murmur.  Regular rate and rhythm. Abdomen: Soft, nontender, nondistended.  Bowel sounds are heard.   Extremities: No cyanosis, clubbing, left great toe area with erythema edema and tenderness on palpation.  Slight decreased edema today. Peripheral pulses are palpable. Psych: Alert, awake and Communicative. CNS:  No cranial nerve deficits.  Moves all extremities. Skin: Warm and dry.  No rashes noted.  Pics on 12/31/23       Data Reviewed:   CBC: Recent Labs  Lab 12/30/23 0333 12/31/23 0434 01/02/24 0425 01/03/24 0525  WBC 16.5* 17.3* 15.0* 11.8*  NEUTROABS 12.8*  --   --   --   HGB 15.6 15.3 14.6 14.8  HCT 44.9 46.0 44.5 43.4  MCV 87.0 89.0 89.2 88.4  PLT 271 303 301 268    Basic Metabolic Panel: Recent Labs  Lab 12/30/23 0333 12/31/23 0434 01/02/24 0425 01/03/24 0525  NA 136 136 135 136  K 3.9 4.3 3.9 3.8  CL 99 101 98 99  CO2 25 27 27 29   GLUCOSE 103* 105* 97 94  BUN 19 21 22 22   CREATININE 1.40* 1.23 1.22 1.21  CALCIUM  9.2 8.7* 8.6* 8.5*    Liver Function Tests: No results for input(s): AST, ALT, ALKPHOS, BILITOT, PROT, ALBUMIN in the last 168 hours.   Radiology Studies: VAS US  ABI WITH/WO TBI Result Date: 01/01/2024  LOWER EXTREMITY DOPPLER STUDY Patient Name:  Kenneth Calhoun  Date of Exam:   01/01/2024 Medical Rec #: [id number]       Accession #:    [id number] Date of Birth: 1960-10-10       Patient Gender: M Patient Age:   57 years Exam Location:  [facility] Procedure:      VAS US  ABI WITH/WO TBI Referring Phys: --------------------------------------------------------------------------------  Indications: LLE great toe pain (gout) High Risk Factors: Hyperlipidemia, no history of smoking. Other Factors: CKD.  Comparison Study: Previous exam was on 03/29/2022 Performing Technologist: Ezzie Potters RVT, RDMS  Examination Guidelines: A complete evaluation includes at minimum, Doppler waveform signals and systolic  blood pressure reading at the level of bilateral brachial, anterior tibial, and posterior tibial arteries, when vessel segments are accessible. Bilateral testing is considered an integral part of a complete examination. Photoelectric Plethysmograph (PPG) waveforms and toe systolic pressure readings are included as required and additional duplex testing as needed. Limited examinations for reoccurring indications may be performed as noted.  ABI Findings: +---------+------------------+-----+---------+--------+ Right    Rt Pressure (mmHg)IndexWaveform Comment  +---------+------------------+-----+---------+--------+ Brachial 159                    triphasic         +---------+------------------+-----+---------+--------+ PTA      211  1.33 biphasic          +---------+------------------+-----+---------+--------+ DP       169               1.06 biphasic          +---------+------------------+-----+---------+--------+ Great Toe149               0.94 Normal            +---------+------------------+-----+---------+--------+ +---------+------------------+-----+-----------+-------+ Left     Lt Pressure (mmHg)IndexWaveform   Comment +---------+------------------+-----+-----------+-------+ Brachial 145                    triphasic          +---------+------------------+-----+-----------+-------+ PTA      186               1.17 multiphasic        +---------+------------------+-----+-----------+-------+ DP       165               1.04 multiphasic        +---------+------------------+-----+-----------+-------+ Great Toe111               0.70 Normal             +---------+------------------+-----+-----------+-------+ +-------+-----------+-----------+------------+------------+ ABI/TBIToday's ABIToday's TBIPrevious ABIPrevious TBI +-------+-----------+-----------+------------+------------+ Right  1.33       0.94       1.24        0.86          +-------+-----------+-----------+------------+------------+ Left   1.17       0.70       1.06        0.66         +-------+-----------+-----------+------------+------------+   Summary: Right: Resting right ankle-brachial index indicates noncompressible right lower extremity arteries. The right toe-brachial index is normal. Left: Resting left ankle-brachial index is within normal range. The left toe-brachial index is normal. *See table(s) above for measurements and observations.  Electronically signed by Debby Robertson on 01/01/2024 at 9:17:44 PM.    Final       LOS: 3 days    Vernal Alstrom, MD [facility] Available via Epic secure chat 7am-7pm After these hours, please refer to coverage provider listed on [email or web address] 01/03/2024, 3:38 PM

## 2024-01-03 NOTE — Plan of Care (Signed)

## 2024-01-03 NOTE — Progress Notes (Signed)
    Subjective:   Procedure(s) (LRB): INCISION AND DRAINAGE, ABSCESS (Left) Patient reports pain as moderate.   Denies CP or SOB.  Voiding without difficulty. Positive flatus. Objective: Vital signs in last 24 hours: Temp:  [97.8 F (36.6 C)-98.1 F (36.7 C)] 98.1 F (36.7 C) (08/23 0440) Pulse Rate:  [68-109] 68 (08/23 0440) Resp:  [16-18] 18 (08/23 0440) BP: (138-149)/(78-85) 149/78 (08/23 0440) SpO2:  [97 %-100 %] 97 % (08/23 0440)  Intake/Output from previous day: 08/22 0701 - 08/23 0700 In: 920 [P.O.:920] Out: 300 [Urine:300] Intake/Output this shift: No intake/output data recorded.  Labs: Recent Labs    01/02/24 0425 01/03/24 0525  HGB 14.6 14.8   Recent Labs    01/02/24 0425 01/03/24 0525  WBC 15.0* 11.8*  RBC 4.99 4.91  HCT 44.5 43.4  PLT 301 268   Recent Labs    01/02/24 0425 01/03/24 0525  NA 135 136  K 3.9 3.8  CL 98 99  CO2 27 29  BUN 22 22  CREATININE 1.22 1.21  GLUCOSE 97 94  CALCIUM  8.6* 8.5*   No results for input(s): LABPT, INR in the last 72 hours.  Physical Exam: Gen: AAOx3, NAD Comfortable at rest   Left Lower Extremity: Skin intact, no blistering Pain with palpation over 1st MTP with swelling & erythema 5/5 strength of the LE bilaterally  Normal dorsal pedal pulsed bilaterally  Normal capillary refill  Body mass index is 30.05 kg/m.   Assessment/Plan:   Procedure(s) (LRB): INCISION AND DRAINAGE, ABSCESS (Left) -Plan for Dr. Barton to take to OR tomorrow for I&D of left great toe 1st MTP joint with intra-op path and micro analysis  -Patient endorses no significant improvement from yesterday even with colchine -NPO at midnight -Hold VTE ppx at midnight  -Continue pain control  -continue care    Jeoffrey LOISE Sages for Dr. Donaciano Sprang [facility] [phone] 01/03/2024, 9:01 AM

## 2024-01-03 NOTE — Plan of Care (Signed)
  Problem: Education: Goal: Knowledge of General Education information will improve Description: Including pain rating scale, medication(s)/side effects and non-pharmacologic comfort measures Outcome: Progressing   Problem: Health Behavior/Discharge Planning: Goal: Ability to manage health-related needs will improve Outcome: Progressing   Problem: Clinical Measurements: Goal: Will remain free from infection Outcome: Progressing Goal: Diagnostic test results will improve Outcome: Progressing   Problem: Activity: Goal: Risk for activity intolerance will decrease Outcome: Progressing   Problem: Nutrition: Goal: Adequate nutrition will be maintained Outcome: Progressing   Problem: Elimination: Goal: Will not experience complications related to bowel motility Outcome: Progressing   Problem: Pain Managment: Goal: General experience of comfort will improve and/or be controlled Outcome: Progressing   Problem: Safety: Goal: Ability to remain free from injury will improve Outcome: Progressing

## 2024-01-04 ENCOUNTER — Other Ambulatory Visit: Payer: Self-pay

## 2024-01-04 ENCOUNTER — Encounter (HOSPITAL_COMMUNITY)
Admission: EM | Disposition: A | Discharge status: Home Health | Payer: Self-pay | Source: Home / Self Care | Attending: Internal Medicine

## 2024-01-04 ENCOUNTER — Inpatient Hospital Stay (HOSPITAL_COMMUNITY): Payer: Self-pay | Admitting: Anesthesiology

## 2024-01-04 DIAGNOSIS — M10072 Idiopathic gout, left ankle and foot: Secondary | ICD-10-CM | POA: Diagnosis not present

## 2024-01-04 HISTORY — PX: INCISION AND DRAINAGE ABSCESS: SHX5864

## 2024-01-04 SURGERY — INCISION AND DRAINAGE, ABSCESS
Anesthesia: General | Site: Foot | Laterality: Left

## 2024-01-04 MED ORDER — OXYCODONE HCL 5 MG PO TABS: 5.0000 mg | ORAL_TABLET | Freq: Once | ORAL | Status: DC | PRN

## 2024-01-04 MED ORDER — POVIDONE-IODINE 10 % EX SOLN
CUTANEOUS | Status: DC | PRN
Start: 2024-01-04 — End: 2024-01-04
  Administered 2024-01-04: 1 via TOPICAL

## 2024-01-04 MED ORDER — ACETAMINOPHEN 10 MG/ML IV SOLN: 1000.0000 mg | Freq: Once | INTRAVENOUS | Status: DC | PRN

## 2024-01-04 MED ORDER — SODIUM CHLORIDE 0.9 % IR SOLN
Status: DC | PRN
  Administered 2024-01-04: 3000 mL

## 2024-01-04 MED ORDER — ACETAMINOPHEN 10 MG/ML IV SOLN
INTRAVENOUS | Status: AC
Start: 2024-01-04 — End: 2024-01-04
  Filled 2024-01-04: qty 100

## 2024-01-04 MED ORDER — FENTANYL CITRATE (PF) 100 MCG/2ML IJ SOLN
INTRAMUSCULAR | Status: DC | PRN
  Administered 2024-01-04 (×2): 50 ug via INTRAVENOUS

## 2024-01-04 MED ORDER — LIDOCAINE 2% (20 MG/ML) 5 ML SYRINGE
INTRAMUSCULAR | Status: DC | PRN
  Administered 2024-01-04: 60 mg via INTRAVENOUS

## 2024-01-04 MED ORDER — FENTANYL CITRATE (PF) 100 MCG/2ML IJ SOLN
INTRAMUSCULAR | Status: AC
  Filled 2024-01-04: qty 2

## 2024-01-04 MED ORDER — ONDANSETRON HCL 4 MG/2ML IJ SOLN
INTRAMUSCULAR | Status: AC
  Filled 2024-01-04: qty 2

## 2024-01-04 MED ORDER — PROPOFOL 10 MG/ML IV BOLUS
INTRAVENOUS | Status: DC | PRN
  Administered 2024-01-04: 200 mg via INTRAVENOUS

## 2024-01-04 MED ORDER — VANCOMYCIN HCL 1000 MG IV SOLR
INTRAVENOUS | Status: AC
  Filled 2024-01-04: qty 20

## 2024-01-04 MED ORDER — KETOROLAC TROMETHAMINE 30 MG/ML IJ SOLN
INTRAMUSCULAR | Status: DC | PRN
  Administered 2024-01-04: 30 mg via INTRAVENOUS

## 2024-01-04 MED ORDER — DEXAMETHASONE SODIUM PHOSPHATE 10 MG/ML IJ SOLN
INTRAMUSCULAR | Status: AC
  Filled 2024-01-04: qty 1

## 2024-01-04 MED ORDER — VANCOMYCIN HCL 1250 MG/250ML IV SOLN
1250.0000 mg | INTRAVENOUS | Status: DC
  Administered 2024-01-05 – 2024-01-06 (×2): 1250 mg via INTRAVENOUS
  Filled 2024-01-04 (×3): qty 250

## 2024-01-04 MED ORDER — CEFAZOLIN SODIUM-DEXTROSE 2-4 GM/100ML-% IV SOLN
INTRAVENOUS | Status: AC
  Filled 2024-01-04: qty 100

## 2024-01-04 MED ORDER — MIDAZOLAM HCL 2 MG/2ML IJ SOLN
INTRAMUSCULAR | Status: AC
  Filled 2024-01-04: qty 2

## 2024-01-04 MED ORDER — CEFAZOLIN SODIUM-DEXTROSE 2-4 GM/100ML-% IV SOLN
2.0000 g | INTRAVENOUS | Status: AC
  Administered 2024-01-04: 2 g via INTRAVENOUS

## 2024-01-04 MED ORDER — VANCOMYCIN HCL 2000 MG/400ML IV SOLN
2000.0000 mg | Freq: Once | INTRAVENOUS | Status: AC
  Administered 2024-01-04: 2000 mg via INTRAVENOUS
  Filled 2024-01-04: qty 400

## 2024-01-04 MED ORDER — BUPIVACAINE HCL (PF) 0.5 % IJ SOLN
INTRAMUSCULAR | Status: DC | PRN
  Administered 2024-01-04: 10 mL

## 2024-01-04 MED ORDER — LIDOCAINE HCL (PF) 2 % IJ SOLN
INTRAMUSCULAR | Status: AC
  Filled 2024-01-04: qty 5

## 2024-01-04 MED ORDER — ACETAMINOPHEN 10 MG/ML IV SOLN
INTRAVENOUS | Status: DC | PRN
  Administered 2024-01-04: 1000 mg via INTRAVENOUS

## 2024-01-04 MED ORDER — MIDAZOLAM HCL 2 MG/2ML IJ SOLN
INTRAMUSCULAR | Status: DC | PRN
  Administered 2024-01-04: 2 mg via INTRAVENOUS

## 2024-01-04 MED ORDER — VANCOMYCIN HCL 1000 MG IV SOLR
INTRAVENOUS | Status: DC | PRN
Start: 2024-01-04 — End: 2024-01-04
  Administered 2024-01-04: 1000 mg via TOPICAL

## 2024-01-04 MED ORDER — OXYCODONE HCL 5 MG/5ML PO SOLN: 5.0000 mg | Freq: Once | ORAL | Status: DC | PRN

## 2024-01-04 MED ORDER — FENTANYL CITRATE PF 50 MCG/ML IJ SOSY
25.0000 ug | PREFILLED_SYRINGE | INTRAMUSCULAR | Status: DC | PRN
  Administered 2024-01-04 (×3): 50 ug via INTRAVENOUS

## 2024-01-04 MED ORDER — FENTANYL CITRATE PF 50 MCG/ML IJ SOSY
PREFILLED_SYRINGE | INTRAMUSCULAR | Status: AC
  Filled 2024-01-04: qty 3

## 2024-01-04 MED ORDER — CHLORHEXIDINE GLUCONATE 4 % EX SOLN
60.0000 mL | Freq: Once | CUTANEOUS | Status: DC
  Filled 2024-01-04: qty 60

## 2024-01-04 MED ORDER — BUPIVACAINE HCL (PF) 0.5 % IJ SOLN
INTRAMUSCULAR | Status: AC
  Filled 2024-01-04: qty 30

## 2024-01-04 MED ORDER — OXYCODONE HCL 5 MG PO TABS: 5.0000 mg | ORAL_TABLET | ORAL | 0 refills | Status: DC | PRN

## 2024-01-04 MED ORDER — CEFAZOLIN SODIUM-DEXTROSE 2-4 GM/100ML-% IV SOLN
2.0000 g | Freq: Three times a day (TID) | INTRAVENOUS | Status: DC
  Administered 2024-01-04 – 2024-01-08 (×12): 2 g via INTRAVENOUS
  Filled 2024-01-04 (×12): qty 100

## 2024-01-04 MED ORDER — VANCOMYCIN HCL IN DEXTROSE 1-5 GM/200ML-% IV SOLN: 1000.0000 mg | Freq: Two times a day (BID) | INTRAVENOUS | Status: DC

## 2024-01-04 MED ORDER — LACTATED RINGERS IV SOLN: INTRAVENOUS | Status: DC | PRN

## 2024-01-04 SURGICAL SUPPLY — 21 items
BAG COUNTER SPONGE SURGICOUNT (BAG) IMPLANT
BANDAGE ESMARK 6X9 LF (GAUZE/BANDAGES/DRESSINGS) ×1 IMPLANT
BNDG ELASTIC 4INX 5YD STR LF (GAUZE/BANDAGES/DRESSINGS) IMPLANT
DRAPE SURG ORHT 6 SPLT 77X108 (DRAPES) ×2 IMPLANT
DRSG MEPITEL 8X12 (GAUZE/BANDAGES/DRESSINGS) IMPLANT
GAUZE SPONGE 4X4 12PLY STRL (GAUZE/BANDAGES/DRESSINGS) IMPLANT
GLOVE BIO SURGEON STRL SZ7.5 (GLOVE) ×1 IMPLANT
GLOVE BIOGEL PI IND STRL 8 (GLOVE) ×2 IMPLANT
GLOVE ECLIPSE 8.0 STRL XLNG CF (GLOVE) ×1 IMPLANT
GOWN STRL REUS W/ TWL XL LVL3 (GOWN DISPOSABLE) ×2 IMPLANT
KIT BASIN OR (CUSTOM PROCEDURE TRAY) ×1 IMPLANT
LAVAGE JET IRRISEPT WOUND (IRRIGATION / IRRIGATOR) IMPLANT
NS IRRIG 1000ML POUR BTL (IV SOLUTION) ×1 IMPLANT
PACK ORTHO EXTREMITY (CUSTOM PROCEDURE TRAY) ×1 IMPLANT
SET HNDPC FAN SPRY TIP SCT (DISPOSABLE) ×1 IMPLANT
STOCKINETTE 8 INCH (MISCELLANEOUS) ×1 IMPLANT
SUT ETHILON 2 0 PSLX (SUTURE) IMPLANT
SUT PDS AB 2-0 CT2 27 (SUTURE) IMPLANT
SUT PROLENE 2 0 SH DA (SUTURE) IMPLANT
TOWEL OR 17X26 10 PK STRL BLUE (TOWEL DISPOSABLE) ×1 IMPLANT
UNDERPAD 30X36 HEAVY ABSORB (UNDERPADS AND DIAPERS) ×1 IMPLANT

## 2024-01-04 NOTE — Op Note (Addendum)
 01/04/2024  9:30 AM   PATIENT: Kenneth Calhoun  63 y.o. male  MRN: [id number]   PRE-OPERATIVE DIAGNOSIS:   Left 1st MTP arthralgia with gouty arthropathy and suspected superinfection   POST-OPERATIVE DIAGNOSIS:   Same   PROCEDURE: 1] Left foot 1st MTP irrigation and debridement 2] Left forefoot incision and drainage 3] Left 1st MTP dorsal cheilectomy and bone biopsy   SURGEON:  Lillia Mountain, MD   ASSISTANT: None   ANESTHESIA: General, regional   EBL: Minimal   TOURNIQUET:    Total Tourniquet Time Documented: Thigh (Left) - 39 minutes Total: Thigh (Left) - 39 minutes    COMPLICATIONS: None apparent   DISPOSITION: Extubated, awake and stable to recovery.   INDICATION FOR PROCEDURE: The patient presented with above diagnosis.  We discussed the diagnosis, alternative treatment options, risks and benefits of the above surgical intervention, as well as alternative non-operative treatments. All questions/concerns were addressed and the patient/family demonstrated appropriate understanding of the diagnosis, the procedure, the postoperative course, and overall prognosis. The patient wished to proceed with surgical intervention and signed an informed surgical consent as such, in each others presence prior to surgery.   PROCEDURE IN DETAIL: After preoperative consent was obtained and the correct operative site was identified, the patient was brought to the operating room supine on stretcher and transferred onto operating table. General anesthesia was induced. Preoperative antibiotics were administered. Surgical timeout was taken. The patient was then positioned supine with an ipsilateral hip bump. The operative lower extremity was prepped and draped in standard sterile fashion with a tourniquet around the thigh. The extremity was exsanguinated and the tourniquet was inflated to 275 mmHg.  A dorsal approach was made directly over the first metatarsophalangeal joint.  This was carried down to the level of the extensor hallucis longus tendon. The capsule was incised medial to the tendon, and the tendon was protected throughout the procedure. The capsule was in order to allow exposure  of the first MTP joint.   There was phlegmon noted adjacent to the MTP joint as well as more proximally along the lateral aspect of 1st metatarsal shaft. This was sent for soft tissue analysis including pathology and microbiology.  We then performed a 1st MTP dorsal cheilectomy and sent the spurs for bone biopsy cultures.  The surgical sites were thoroughly irrigated. The tourniquet was deflated and hemostasis achieved. Irrisept solution used as well as several liters of normal saline. Betadine  and vancomycin  powder were applied. The deep layers were closed using 2-0 vicryl. The skin was closed without tension.    The leg was cleaned with saline and sterile dressings with gauze were applied. A well padded wrap was applied. The patient was awakened from anesthesia and transported to the recovery room in stable condition.    FOLLOW UP PLAN: -transfer to PACU, then return to [facility] under hospital medicine team -strict heel WB in forefoot offloading postop shoe operative extremity, maximum elevation -bedside nursing to do daily dressing changes starting POD1 -trend intra-op cx, consider ID consult given intra-op findings and likely osteomyelitis requiring prolonged outpatient abx therapy -Ancef  & Vanc ordered postop -DVT ppx: Aspirin  81 mg twice daily or per primary team -pain rx placed in chart -follow up as outpatient within 7-10 days for wound check -sutures out in 2-3 weeks in outpatient office   RADIOGRAPHS: AP, lateral, oblique and stress radiographs of the left foot were obtained intraoperatively. These showed interval 1st MTP dorsal cheilectomy. Manual stress radiographs were taken and the joints  were noted to be stable following procedure. No other acute injuries are  noted.   Lillia Mountain [facility]

## 2024-01-04 NOTE — H&P (Signed)

## 2024-01-04 NOTE — Transfer of Care (Signed)
 Immediate Anesthesia Transfer of Care Note  Patient: Kenneth Calhoun  Procedure(s) Performed: INCISION AND DRAINAGE, ABSCESS (Left: Foot)  Patient Location: PACU  Anesthesia Type:General  Level of Consciousness: awake and patient cooperative  Airway & Oxygen Therapy: Patient Spontanous Breathing and Patient connected to face mask  Post-op Assessment: Report given to RN and Post -op Vital signs reviewed and stable  Post vital signs: Reviewed and stable  Last Vitals:  Vitals Value Taken Time  BP    Temp    Pulse 70 01/04/24 08:49  Resp    SpO2 100 % 01/04/24 08:49  Vitals shown include unfiled device data.  Last Pain:  Vitals:   01/04/24 0554  TempSrc:   PainSc: 0-No pain      Patients Stated Pain Goal: 3 (12/31/23 1030)  Complications: No notable events documented.

## 2024-01-04 NOTE — Anesthesia Preprocedure Evaluation (Addendum)
 Anesthesia Evaluation  Patient identified by MRN, date of birth, ID band Patient awake    Reviewed: Allergy & Precautions, NPO status , Patient's Chart, lab work & pertinent test results  History of Anesthesia Complications Negative for: history of anesthetic complications  Airway Mallampati: I  TM Distance: >3 FB Neck ROM: Full    Dental  (+) Teeth Intact, Dental Advisory Given, Caps   Pulmonary neg shortness of breath, sleep apnea and Continuous Positive Airway Pressure Ventilation , neg COPD, neg recent URI   breath sounds clear to auscultation       Cardiovascular negative cardio ROS  Rhythm:Regular     Neuro/Psych negative neurological ROS     GI/Hepatic Neg liver ROS,GERD  ,,  Endo/Other  negative endocrine ROS  Lab Results      Component                Value               Date                      HGBA1C                   4.8                 01/24/2022             Renal/GU CRFRenal diseaseLab Results      Component                Value               Date                      NA                       136                 01/03/2024                K                        3.8                 01/03/2024                CO2                      29                  01/03/2024                GLUCOSE                  94                  01/03/2024                BUN                      22                  01/03/2024                CREATININE               1.21  01/03/2024                CALCIUM                   8.5 (L)             01/03/2024                GFR                      59.20 (L)           12/22/2023                GFRNONAA                 >60                 01/03/2024                Musculoskeletal Gout vs infected left big toe   Abdominal   Peds  Hematology Lab Results      Component                Value               Date                      WBC                      11.8 (H)             01/03/2024                HGB                      14.8                01/03/2024                HCT                      43.4                01/03/2024                MCV                      88.4                01/03/2024                PLT                      268                 01/03/2024              Anesthesia Other Findings Lower caps  Reproductive/Obstetrics                              Anesthesia Physical Anesthesia Plan  ASA: 2  Anesthesia Plan: General   Post-op Pain Management: Toradol  IV (intra-op)* and Ofirmev  IV (intra-op)*   Induction: Intravenous  PONV Risk Score and Plan: 2 and Ondansetron  and Dexamethasone   Airway Management Planned: LMA  Additional Equipment: None  Intra-op Plan:   Post-operative Plan: Extubation in OR  Informed Consent: I have reviewed the patients History and Physical, chart, labs and discussed the procedure including the risks, benefits and alternatives for the proposed anesthesia with the patient or authorized representative who has indicated his/her understanding and acceptance.     Dental advisory given  Plan Discussed with: CRNA  Anesthesia Plan Comments:          Anesthesia Quick Evaluation

## 2024-01-04 NOTE — Anesthesia Procedure Notes (Signed)
 Procedure Name: LMA Insertion Date/Time: 01/04/2024 7:50 AM  Performed by: Judythe Tanda Aran, CRNAPre-anesthesia Checklist: Emergency Drugs available, Patient identified, Suction available and Patient being monitored Patient Re-evaluated:Patient Re-evaluated prior to induction Oxygen Delivery Method: Circle system utilized Preoxygenation: Pre-oxygenation with 100% oxygen Induction Type: IV induction Ventilation: Mask ventilation without difficulty LMA: LMA inserted LMA Size: 4.0 Number of attempts: 1 Placement Confirmation: positive ETCO2 and breath sounds checked- equal and bilateral Tube secured with: Tape Dental Injury: Teeth and Oropharynx as per pre-operative assessment

## 2024-01-04 NOTE — Progress Notes (Signed)
    Subjective: * Day of Surgery * Procedure(s) (LRB): INCISION AND DRAINAGE, ABSCESS (Left) Patient reports pain as moderate.   Denies CP or SOB.  Voiding without difficulty. Positive flatus. Objective: Vital signs in last 24 hours: Temp:  [97.6 F (36.4 C)-98.7 F (37.1 C)] 98 F (36.7 C) (08/24 1956) Pulse Rate:  [63-79] 79 (08/24 1956) Resp:  [8-20] 20 (08/24 1006) BP: (114-146)/(75-93) 134/79 (08/24 1956) SpO2:  [91 %-100 %] 91 % (08/24 1956)  Intake/Output from previous day: 08/23 0701 - 08/24 0700 In: 600 [P.O.:600] Out: -  Intake/Output this shift: Total I/O In: -  Out: 675 [Urine:675]  Labs: Recent Labs    01/02/24 0425 01/03/24 0525  HGB 14.6 14.8   Recent Labs    01/02/24 0425 01/03/24 0525  WBC 15.0* 11.8*  RBC 4.99 4.91  HCT 44.5 43.4  PLT 301 268   Recent Labs    01/02/24 0425 01/03/24 0525  NA 135 136  K 3.9 3.8  CL 98 99  CO2 27 29  BUN 22 22  CREATININE 1.22 1.21  GLUCOSE 97 94  CALCIUM  8.6* 8.5*   No results for input(s): LABPT, INR in the last 72 hours.  Physical Exam: Gen: AAOx3, NAD Comfortable at rest   Left Lower Extremity: Skin intact, no blistering Pain with palpation over 1st MTP with swelling & erythema 5/5 strength of the LE bilaterally  Normal dorsal pedal pulsed bilaterally  Normal capillary refill  Body mass index is 30.05 kg/m.   Assessment/Plan: * Day of Surgery * Procedure(s) (LRB): INCISION AND DRAINAGE, ABSCESS (Left) -OR today for I&D of left great toe 1st MTP joint with intra-op path and micro analysis  -Patient reports no significant improvement from yesterday even with colchine -NPO since midnight -Held VTE ppx since midnight  -Continue pain control    Lillia Mountain  [facility] [phone] 01/04/2024, 10:44 PM

## 2024-01-04 NOTE — Progress Notes (Signed)
 Orthopedic Tech Progress Note Patient Details:  Kenneth Calhoun 06-Mar-1961 [id number]  Ortho Devices Type of Ortho Device: Darco shoe Ortho Device/Splint Location: LLE Ortho Device/Splint Interventions: Application   Post Interventions Patient Tolerated: Well  Massie BRAVO Hajime Asfaw 01/04/2024, 10:34 AM

## 2024-01-04 NOTE — Anesthesia Postprocedure Evaluation (Signed)
 Anesthesia Post Note  Patient: Kenneth Calhoun  Procedure(s) Performed: INCISION AND DRAINAGE, ABSCESS (Left: Foot)     Patient location during evaluation: Nursing Unit Anesthesia Type: General Level of consciousness: awake and patient cooperative Pain management: pain level controlled Vital Signs Assessment: post-procedure vital signs reviewed and stable Respiratory status: spontaneous breathing, nonlabored ventilation and respiratory function stable Cardiovascular status: blood pressure returned to baseline and stable Postop Assessment: no apparent nausea or vomiting Anesthetic complications: no   No notable events documented.  Last Vitals:  Vitals:   01/04/24 0950 01/04/24 1006  BP:  (!) 142/75  Pulse: 67 67  Resp: 12 20  Temp: 36.4 C 36.6 C  SpO2: 99% 98%    Last Pain:  Vitals:   01/04/24 1121  TempSrc:   PainSc: Asleep                 Jamika Sadek

## 2024-01-04 NOTE — Progress Notes (Addendum)
 PROGRESS NOTE    Kenneth Calhoun  FMW:[id number] DOB: [birth year] DOA: 12/30/2023 PCP: Nedra Tinnie LABOR, NP    Brief Narrative:   Kenneth Calhoun is a 63 y.o. male with past medical history of complex regional left lower extremity pain, being followed by pain management but not on narcotics as per patient,  and recent diagnosis of gout presented to the hospital with intractable left foot pain and inability to bear weight.  He is followed by [facility] and is on chronic Lyrica  as well as suzetrigine, as per the documentation  he has chronic left lower extremity pain typically 6 out of 10 despite medications.  He presented to orthopedic urgent care on 8/9 with approximately 4 days of tingling, swelling and redness of the left great toe. He was diagnosed with acute gout, prescribed colchicine  and Indocin .  He went to see his PCP on 8/11, they discontinued his colchicine  and NSAID due to chronic GFR of 50, instead prescribed prednisone  taper which patient had been taking.  Despite all of these interventions, his pain did not improve, and had difficulty ambulating so he again presented to the ED.  In the ED, patient was given IV Solu-Medrol  Dilaudid  and was admitted hospital for pain control.    Assessment and plan Principal Problem:   Gout attack Active Problems:   Obesity (BMI 30-39.9)   Mixed hyperlipidemia   CKD stage 3a, GFR 45-59 ml/min (HCC)    Acute flare of gout with intractable pain- X-ray of the foot without any acute abnormality.  Patient did not have any systemic signs of infection but complained of intractable pain despite being on colchicine  and steroids and opiates.  Orthopedics was consulted and had MRI of the foot with multiple findings.  Patient then underwent left foot first metatarsophalangeal joint irrigation and debridement forefoot incision and drainage and bone biopsy on 01/04/2024.  Continue IV Solu-Medrol .Patient has been started on vancomycin  and cefazolin  as  per orthopedics.  Follow cultures of the foot, bone biopsy.  Follow orthopedic recommendation.  Mild leukocytosis likely reactive secondary to steroids.  Latest WBC at 11.8 from initial 17.3.   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  States that he does not take opiates at home for this problem.   CKD stage IIIa-with GFR 50, monitor renal function closely in house.  Latest creatinine of 1.2.   Black stool with 1 episode reported   Pending stool occult blood.  Will closely monitor.  On Protonix .  Uncertain about it.  Hemoglobin has remained stable and latest hemoglobin of 14.8.   DVT prophylaxis:  Refusing Lovenox   Code Status:     Code Status: Full Code  Disposition: Home when improved, and okay with orthopedics likely in 1 to 2 days  Status is: Inpatient  The patient is inpatient because: Intractable pain, IV narcotics, pending clinical improvement, status post And D today.  Follow cultures   Family Communication: Spoke with the patient's son at bedside on 12/31/2023  Consultants:  Orthopedics  Procedures:  Left forefoot incision and drainage, left foot first metatarsal phalangeal irrigation and debridement and biopsy on 01/04/2024  Antimicrobials:  None  Anti-infectives (From admission, onward)    Start     Dose/Rate Route Frequency Ordered Stop   01/04/24 1400  ceFAZolin  (ANCEF ) IVPB 1 g/50 mL premix        1 g 100 mL/hr over 30 Minutes Intravenous Every 8 hours 01/04/24 1007     01/04/24 1015  vancomycin  (  VANCOCIN ) IVPB 1000 mg/200 mL premix        1,000 mg 200 mL/hr over 60 Minutes Intravenous Every 12 hours 01/04/24 1007     01/04/24 0829  vancomycin  (VANCOCIN ) powder  Status:  Discontinued          As needed 01/04/24 0829 01/04/24 0859   01/04/24 0730  ceFAZolin  (ANCEF ) IVPB 2g/100 mL premix        2 g 200 mL/hr over 30 Minutes Intravenous On call to O.R. 01/04/24 9360 01/04/24 0803        Subjective: Today, patient  was seen and examined at bedside.  Patient complains of foot pain and was seen after surgical intervention.  Denies other complaints.   Objective: Vitals:   01/04/24 0930 01/04/24 0937 01/04/24 0950 01/04/24 1006  BP: (!) 140/87   (!) 142/75  Pulse: 70 63 67 67  Resp: 11 11 12 20   Temp:   97.6 F (36.4 C) 97.8 F (36.6 C)  TempSrc:    Oral  SpO2: 99% 99% 99% 98%  Weight:      Height:        Intake/Output Summary (Last 24 hours) at 01/04/2024 1052 Last data filed at 01/04/2024 0846 Gross per 24 hour  Intake 860 ml  Output 0 ml  Net 860 ml   Filed Weights   12/30/23 0850  Weight: 95 kg    Physical Examination: Body mass index is 30.05 kg/m.   General:  Average built, in mild distress, HENT:   No scleral pallor or icterus noted. Oral mucosa is moist.  Chest:  Clear breath sounds.  Diminished breath sounds bilaterally. No crackles or wheezes.  CVS: S1 &S2 heard. No murmur.  Regular rate and rhythm. Abdomen: Soft, nontender, nondistended.  Bowel sounds are heard.   Extremities: No cyanosis, clubbing with dressing on the left foot. Psych: Alert, awake and oriented, normal mood CNS:  No cranial nerve deficits.  Power equal in all extremities.   Skin: Warm and dry.  No rashes noted.  Pics from 12/31/23       Data Reviewed:   CBC: Recent Labs  Lab 12/30/23 0333 12/31/23 0434 01/02/24 0425 01/03/24 0525  WBC 16.5* 17.3* 15.0* 11.8*  NEUTROABS 12.8*  --   --   --   HGB 15.6 15.3 14.6 14.8  HCT 44.9 46.0 44.5 43.4  MCV 87.0 89.0 89.2 88.4  PLT 271 303 301 268    Basic Metabolic Panel: Recent Labs  Lab 12/30/23 0333 12/31/23 0434 01/02/24 0425 01/03/24 0525  NA 136 136 135 136  K 3.9 4.3 3.9 3.8  CL 99 101 98 99  CO2 25 27 27 29   GLUCOSE 103* 105* 97 94  BUN 19 21 22 22   CREATININE 1.40* 1.23 1.22 1.21  CALCIUM  9.2 8.7* 8.6* 8.5*    Liver Function Tests: No results for input(s): AST, ALT, ALKPHOS, BILITOT, PROT, ALBUMIN in the last 168  hours.   Radiology Studies: No results found.     LOS: 4 days    Vernal Alstrom, MD [facility] Available via Epic secure chat 7am-7pm After these hours, please refer to coverage provider listed on [email or web address] 01/04/2024, 10:52 AM

## 2024-01-04 NOTE — Plan of Care (Signed)

## 2024-01-04 NOTE — Progress Notes (Signed)
 Pharmacy Antibiotic Note  Kenneth Calhoun is a 63 y.o. male who was recently dx with gout,  presented to the ED 12/30/2023 with c/o severe pain in his left foot.  He underwent left foot 1st MTP I&D, left forefoot I&D, left 1st MTP dorsal cheilectomy and bone biopsy on 01/04/24. Per Dr. Ramananthan via [facility] msg, post-op abx (ancef  and vancomycin ) are for the treatment of foot osteomyelitis. Pharmacy has been consulted to dose vancomycin  for infection.  - 8/20 left foot MRI: Septic arthritis of the first MTP joint with associated soft tissue infection and possible early osteomyelitis cannot be excluded.  Today, 01/04/2024: - scr 1.21 (crcl~72); hx CKD - afeb  Plan: - vancomycin  2000 mg IV x1, then 1250 mg q24h for est AUC 454 - will adjust ancef  to 2gm IV q8h for osteomyelitis indication  ______________________________________________  Height: 5' 10 (177.8 cm) Weight: 95 kg (209 lb 7 oz) IBW/kg (Calculated) : 73  Temp (24hrs), Avg:97.9 F (36.6 C), Min:97.4 F (36.3 C), Max:98.7 F (37.1 C)  Recent Labs  Lab 12/30/23 0333 12/31/23 0434 01/02/24 0425 01/03/24 0525  WBC 16.5* 17.3* 15.0* 11.8*  CREATININE 1.40* 1.23 1.22 1.21  LATICACIDVEN 0.8  --   --   --     Estimated Creatinine Clearance: 72.3 mL/min (by C-G formula based on SCr of 1.21 mg/dL).    No Known Allergies   Thank you for allowing pharmacy to be a part of this patient's care.  Kenneth Calhoun 01/04/2024 10:32 AM

## 2024-01-04 NOTE — Plan of Care (Signed)
  Problem: Education: Goal: Knowledge of General Education information will improve Description: Including pain rating scale, medication(s)/side effects and non-pharmacologic comfort measures Outcome: Progressing   Problem: Health Behavior/Discharge Planning: Goal: Ability to manage health-related needs will improve Outcome: Progressing   Problem: Clinical Measurements: Goal: Ability to maintain clinical measurements within normal limits will improve Outcome: Progressing Goal: Will remain free from infection Outcome: Progressing Goal: Diagnostic test results will improve Outcome: Progressing Goal: Respiratory complications will improve Outcome: Progressing Goal: Cardiovascular complication will be avoided Outcome: Progressing   Problem: Activity: Goal: Risk for activity intolerance will decrease Outcome: Progressing   Problem: Nutrition: Goal: Adequate nutrition will be maintained Outcome: Progressing   Problem: Coping: Goal: Level of anxiety will decrease Outcome: Progressing   Problem: Elimination: Goal: Will not experience complications related to bowel motility pt will return to baseline Outcome: Progressing Goal: Will not experience complications related to urinary retention Outcome: Progressing   Problem: Pain Managment: Goal: General experience of comfort will improve and/or be controlled Outcome: Progressing   Problem: Safety: Goal: Ability to remain free from injury will improve Outcome: Progressing   Problem: Skin Integrity: Goal: Risk for impaired skin integrity will decrease Outcome: Progressing

## 2024-01-05 ENCOUNTER — Other Ambulatory Visit: Payer: Self-pay

## 2024-01-05 ENCOUNTER — Encounter (HOSPITAL_COMMUNITY): Payer: Self-pay | Admitting: Orthopaedic Surgery

## 2024-01-05 DIAGNOSIS — M00072 Staphylococcal arthritis, left ankle and foot: Secondary | ICD-10-CM

## 2024-01-05 DIAGNOSIS — M86172 Other acute osteomyelitis, left ankle and foot: Secondary | ICD-10-CM

## 2024-01-05 DIAGNOSIS — M869 Osteomyelitis, unspecified: Secondary | ICD-10-CM

## 2024-01-05 DIAGNOSIS — M10072 Idiopathic gout, left ankle and foot: Secondary | ICD-10-CM | POA: Diagnosis not present

## 2024-01-05 LAB — CREATININE, SERUM
Creatinine, Ser: 1.3 mg/dL — ABNORMAL HIGH (ref 0.61–1.24)
GFR, Estimated: >60 mL/min (ref >60)

## 2024-01-05 LAB — HEMOGLOBIN A1C
Hgb A1c MFr Bld: 4.6 % — ABNORMAL LOW (ref 4.8–5.6)
Mean Plasma Glucose: 85.32 mg/dL

## 2024-01-05 MED ORDER — CHLORHEXIDINE GLUCONATE CLOTH 2 % EX PADS
6.0000 | MEDICATED_PAD | Freq: Every day | CUTANEOUS | Status: DC
  Administered 2024-01-05 – 2024-01-08 (×4): 6 via TOPICAL

## 2024-01-05 MED ORDER — ENOXAPARIN SODIUM 40 MG/0.4ML IJ SOSY
40.0000 mg | PREFILLED_SYRINGE | Freq: Every day | INTRAMUSCULAR | Status: DC
  Administered 2024-01-05 – 2024-01-08 (×4): 40 mg via SUBCUTANEOUS
  Filled 2024-01-05 (×4): qty 0.4

## 2024-01-05 MED ORDER — SODIUM CHLORIDE 0.9% FLUSH: 10.0000 mL | INTRAVENOUS | Status: DC | PRN

## 2024-01-05 MED ORDER — MELATONIN 5 MG PO TABS
5.0000 mg | ORAL_TABLET | Freq: Every evening | ORAL | Status: DC | PRN
  Administered 2024-01-05 – 2024-01-06 (×2): 5 mg via ORAL
  Filled 2024-01-05 (×2): qty 1

## 2024-01-05 MED ORDER — SODIUM CHLORIDE 0.9% FLUSH
10.0000 mL | Freq: Two times a day (BID) | INTRAVENOUS | Status: DC
  Administered 2024-01-05: 10 mL
  Administered 2024-01-06: 20 mL
  Administered 2024-01-06 – 2024-01-07 (×2): 10 mL
  Administered 2024-01-07 – 2024-01-08 (×2): 20 mL

## 2024-01-05 NOTE — Progress Notes (Addendum)
 PROGRESS NOTE    Kenneth Calhoun  FMW:[id number] DOB: 15-Mar-1961 DOA: 12/30/2023 PCP: Nedra Tinnie LABOR, NP    Brief Narrative:   Kenneth Calhoun is a 63 y.o. male with past medical history of complex regional left lower extremity pain, being followed by pain management but not on narcotics as per patient,  and recent diagnosis of gout presented to the hospital with intractable left foot pain and inability to bear weight.  He is followed by [facility] and is on chronic Lyrica  as well as suzetrigine, as per the documentation  he has chronic left lower extremity pain typically 6 out of 10 despite medications.  He presented to orthopedic urgent care on 8/9 with approximately 4 days of tingling, swelling and redness of the left great toe. He was diagnosed with acute gout, prescribed colchicine  and Indocin .  He went to see his PCP on 8/11, they discontinued his colchicine  and NSAID due to chronic GFR of 50, instead prescribed prednisone  taper which patient had been taking.  Despite all of these interventions, his pain did not improve, and had difficulty ambulating so he again presented to the ED.  In the ED, patient was given IV Solu-Medrol  Dilaudid  and was admitted hospital for pain control.    Assessment and plan Principal Problem:   Gout attack Active Problems:   Obesity (BMI 30-39.9)   Mixed hyperlipidemia   CKD stage 3a, GFR 45-59 ml/min (HCC)    Acute flare of gout with intractable left foot pain Possible left foot osteomyelitis. X-ray of the foot without any acute abnormality.  Patient did not have any systemic signs of infection but complained of intractable pain despite being on colchicine  and steroids and opiates.  Orthopedics was consulted and had MRI of the foot with multiple findings.  Patient then underwent left foot first metatarsophalangeal joint irrigation and debridement forefoot incision and drainage and bone biopsy on 01/04/2024.  Bone biopsy and tissue sample with  staph aureus.  Patient is currently on cefazolin  and vancomycin  as per orthopedics.  As per orthopedics clinical osteomyelitis.  Will consult ID.  Follow sensitivity..  Continue IV Solu-Medrol  for possible gouty attack.  Follow orthopedic recommendation.  Mild leukocytosis likely reactive secondary to steroids.  Latest WBC at 11.8 from initial 17.3.   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  States that he does not take opiates at home for this problem.  Currently on Dilaudid  oxycodone  and still complains of pain.   CKD stage IIIa-with GFR 50, monitor renal function closely in house.  Latest creatinine of 1.2.   Black stool with 1 episode reported   Pending stool occult blood.  Will closely monitor.  On Protonix .  Uncertain about it.  Hemoglobin has remained stable and latest hemoglobin of 14.8 and has remained stable.  Unlikely to be GI bleed.   DVT prophylaxis: enoxaparin  (LOVENOX ) injection 40 mg Start: 01/05/24 1000 Refusing Lovenox   Code Status:     Code Status: Full Code  Disposition: Home when improved, and okay with orthopedics likely in 1 to 2 days  Status is: Inpatient  The patient is inpatient because: IV antibiotic, IV analgesia, pending cultures and disposition plan, ID consultation.   Family Communication: Spoke with the patient's son at bedside on 12/31/2023  Consultants:  Orthopedics Infectious disease.  Procedures:  Left forefoot incision and drainage, left foot first metatarsal phalangeal irrigation and debridement and biopsy on 01/04/2024  Antimicrobials:  Vancomycin  and cefazolin   Anti-infectives (From admission, onward)  Start     Dose/Rate Route Frequency Ordered Stop   01/05/24 1200  vancomycin  (VANCOREADY) IVPB 1250 mg/250 mL        1,250 mg 166.7 mL/hr over 90 Minutes Intravenous Every 24 hours 01/04/24 1055     01/04/24 1600  ceFAZolin  (ANCEF ) IVPB 2g/100 mL premix        2 g 200 mL/hr over 30 Minutes  Intravenous Every 8 hours 01/04/24 1007     01/04/24 1200  vancomycin  (VANCOREADY) IVPB 2000 mg/400 mL        2,000 mg 200 mL/hr over 120 Minutes Intravenous  Once 01/04/24 1055 01/04/24 1415   01/04/24 1015  vancomycin  (VANCOCIN ) IVPB 1000 mg/200 mL premix  Status:  Discontinued        1,000 mg 200 mL/hr over 60 Minutes Intravenous Every 12 hours 01/04/24 1007 01/04/24 1054   01/04/24 0829  vancomycin  (VANCOCIN ) powder  Status:  Discontinued          As needed 01/04/24 0829 01/04/24 0859   01/04/24 0730  ceFAZolin  (ANCEF ) IVPB 2g/100 mL premix        2 g 200 mL/hr over 30 Minutes Intravenous On call to O.R. 01/04/24 9360 01/04/24 0803       Subjective: Today, patient was seen and examined at bedside.  Patient still complains of foot pain especially on trying to get out of the bed.  Denies any fever chills nausea vomiting or shortness of breath or dyspnea.   Objective: Vitals:   01/04/24 0950 01/04/24 1006 01/04/24 1956 01/05/24 0533  BP:  (!) 142/75 134/79 130/73  Pulse: 67 67 79 62  Resp: 12 20 20 20   Temp: 97.6 F (36.4 C) 97.8 F (36.6 C) 98 F (36.7 C) 98.2 F (36.8 C)  TempSrc:  Oral  Oral  SpO2: 99% 98% 91% 96%  Weight:      Height:        Intake/Output Summary (Last 24 hours) at 01/05/2024 0906 Last data filed at 01/05/2024 0426 Gross per 24 hour  Intake 1320 ml  Output 2250 ml  Net -930 ml   Filed Weights   12/30/23 0850  Weight: 95 kg    Physical Examination: Body mass index is 30.05 kg/m.   General:  Average built, in mild distress, HENT:   No scleral pallor or icterus noted. Oral mucosa is moist.  Chest:  Clear breath sounds.   No crackles or wheezes.  CVS: S1 &S2 heard. No murmur.  Regular rate and rhythm. Abdomen: Soft, nontender, nondistended.  Bowel sounds are heard.   Extremities: No cyanosis, clubbing, dressing over the left foot. Psych: Alert, awake and oriented, normal mood CNS:  No cranial nerve deficits.  Power equal in all extremities.    Skin: Warm and dry.  No rashes noted.  Pics from 12/31/23 on presentation.       Data Reviewed:   CBC: Recent Labs  Lab 12/30/23 0333 12/31/23 0434 01/02/24 0425 01/03/24 0525  WBC 16.5* 17.3* 15.0* 11.8*  NEUTROABS 12.8*  --   --   --   HGB 15.6 15.3 14.6 14.8  HCT 44.9 46.0 44.5 43.4  MCV 87.0 89.0 89.2 88.4  PLT 271 303 301 268    Basic Metabolic Panel: Recent Labs  Lab 12/30/23 0333 12/31/23 0434 01/02/24 0425 01/03/24 0525 01/05/24 0430  NA 136 136 135 136  --   K 3.9 4.3 3.9 3.8  --   CL 99 101 98 99  --   CO2 25  27 27 29   --   GLUCOSE 103* 105* 97 94  --   BUN 19 21 22 22   --   CREATININE 1.40* 1.23 1.22 1.21 1.30*  CALCIUM  9.2 8.7* 8.6* 8.5*  --     Liver Function Tests: No results for input(s): AST, ALT, ALKPHOS, BILITOT, PROT, ALBUMIN in the last 168 hours.   Radiology Studies: No results found.     LOS: 5 days    Vernal Alstrom, MD [facility] Available via Epic secure chat 7am-7pm After these hours, please refer to coverage provider listed on [email or web address] 01/05/2024, 9:06 AM

## 2024-01-05 NOTE — Consult Note (Signed)
 [facility]    Date of Admission:  12/30/2023   Total days of inpatient antibiotics 1        Reason for Consult: Osteomyelitis    Principal Problem:   Gout attack Active Problems:   Obesity (BMI 30-39.9)   Mixed hyperlipidemia   CKD stage 3a, GFR 45-59 ml/min [facility])   Assessment: 63 year old male with history of OSA, hyperlipidemia, anxiety, left leg fracture in 2023 requiring Ex-Fix and I&D being managed for complex regional left lower extremity pain by  pain institute (pain 6 out of 10 at baseline )presented with worsening wound.  He was seen by urgent care orthopedics on 8/9 with tingling left great toe, noted that he has stubbed his toe, history of gout as well as seen by PCP and discontinued his colchicine  NSAID due to GFR being 50 presents for management of left foot wound. 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  Or cultures and bone biopsy growing Staph aureus - Patient's Banken cefazolin .  ID engaged. -Patient denies drug use.  Amenable to IV antibiotics outpatient. Recommendations:  -Follow or cultures and pathology - Continue vancomycin  and cefazolin  - Standard precautions - Place PICC.  Plan on 6 weeks of antibiotics. -Plan communicated primary   Evaluation of this patient requires complex antimicrobial therapy evaluation and counseling + isolation needs for disease transmission risk assessment and mitigation   Microbiology:   Antibiotics: Cefazolin  8/24-present Vancomycin  8/24  Cultures: Blood  Urine  Other 8/24 bone biopsy 8/24 or cultures  HPI: Kenneth Calhoun is a 63 y.o. male with past medical history of complex regional left lower extremity pain, followed by pain  management and recent diagnosis of gout admitted for intractable left foot pain and inability to bear weight x 2 weeks.  Followed by [place] [facility] and she showed on chronic Lyrica  as well as visit to regain.  Seen at orthopedic urgent care on 8/9 with tingling left great toe, he had stubbed his toe at that point.On arrival patient afebrile.  MR left foot showed moderate arthropathy first MTP, marrow edema involving the head of first metatarsal and base of first proximal phalanx.  First MTP joint effusion, septic arthritis and associated soft tissue infection with early osteomyelitis not excluded.  Orthopedics was engaged patient taken to the OR for I&D and bone biopsy.  Per OR note there was phlegmon adjacent to MTP which was sent for path and microbiology.  Or cultures and bone biopsy growing Staph aureus.  ID engaged.    Review of Systems: Review of Systems  All other systems reviewed and are negative.   Past Medical History:  Diagnosis Date   Arthritis    Depression 12/2021   The noted that he thinks i have Depression and possibly PTSD from my fall in July.  We are working on [employer] counseling now.   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    Social History   Tobacco Use   Smoking status: Never   Smokeless tobacco: Never  Vaping Use   Vaping status: Never Used  Substance Use Topics   Alcohol use: No   Drug use: No    Family History  Problem Relation Age  of Onset   Alcohol abuse Mother    Alcohol abuse Father    Arthritis Sister    Diabetes Sister    Obesity Sister    Arthritis Sister    Depression Sister    Arthritis Brother    Depression Sister    Diabetes Sister    Obesity Sister    Scheduled Meds:  enoxaparin  (LOVENOX ) injection  40 mg Subcutaneous Daily   methylPREDNISolone  (SOLU-MEDROL ) injection  40 mg Intravenous Daily   pantoprazole   40 mg Oral Daily   pregabalin   100 mg Oral TID   Continuous Infusions:    ceFAZolin  (ANCEF ) IV 2 g (01/05/24 0819)   vancomycin      PRN Meds:.acetaminophen  **OR** acetaminophen , albuterol , HYDROmorphone  (DILAUDID ) injection, meclizine , ondansetron  **OR** ondansetron  (ZOFRAN ) IV, oxyCODONE  No Known Allergies  OBJECTIVE: Blood pressure 130/73, pulse 62, temperature 98.2 F (36.8 C), temperature source Oral, resp. rate 20, height 5' 10 (1.778 m), weight 95 kg, SpO2 96%.  Physical Exam Constitutional:      General: He is not in acute distress.    Appearance: He is normal weight. He is not toxic-appearing.  HENT:     Head: Normocephalic and atraumatic.     Right Ear: External ear normal.     Left Ear: External ear normal.     Nose: No congestion or rhinorrhea.     Mouth/Throat:     Mouth: Mucous membranes are moist.     Pharynx: Oropharynx is clear.  Eyes:     Extraocular Movements: Extraocular movements intact.     Conjunctiva/sclera: Conjunctivae normal.     Pupils: Pupils are equal, round, and reactive to light.  Cardiovascular:     Rate and Rhythm: Normal rate and regular rhythm.     Heart sounds: No murmur heard.    No friction rub. No gallop.  Pulmonary:     Effort: Pulmonary effort is normal.     Breath sounds: Normal breath sounds.  Abdominal:     General: Abdomen is flat. Bowel sounds are normal.     Palpations: Abdomen is soft.  Musculoskeletal:        General: No swelling.     Cervical back: Normal range of motion and neck supple.     Comments: Left foot bandaged  Skin:    General: Skin is warm and dry.  Neurological:     General: No focal deficit present.     Mental Status: He is oriented to person, place, and time.  Psychiatric:        Mood and Affect: Mood normal.     Lab Results Lab Results  Component Value Date   WBC 11.8 (H) 01/03/2024   HGB 14.8 01/03/2024   HCT 43.4 01/03/2024   MCV 88.4 01/03/2024   PLT 268 01/03/2024    Lab Results  Component Value Date   CREATININE 1.30 (H) 01/05/2024   BUN 22 01/03/2024   NA  136 01/03/2024   K 3.8 01/03/2024   CL 99 01/03/2024   CO2 29 01/03/2024    Lab Results  Component Value Date   ALT 19 12/25/2022   AST 22 12/25/2022   ALKPHOS 75 12/25/2022   BILITOT 0.7 12/25/2022       Kenneth Stank, MD [facility] 01/05/2024, 11:11 AM

## 2024-01-05 NOTE — Progress Notes (Signed)
 Patient is willing to take lovenox  once a new order is placed. Strongly encouraged patient not to refuse when ordered again by MD.

## 2024-01-05 NOTE — Progress Notes (Signed)
 Peripherally Inserted Central Catheter Placement  The IV Nurse has discussed with the patient and/or persons authorized to consent for the patient, the purpose of this procedure and the potential benefits and risks involved with this procedure.  The benefits include less needle sticks, lab draws from the catheter, and the patient may be discharged home with the catheter. Risks include, but not limited to, infection, bleeding, blood clot (thrombus formation), and puncture of an artery; nerve damage and irregular heartbeat and possibility to perform a PICC exchange if needed/ordered by physician.  Alternatives to this procedure were also discussed.  Bard Power PICC patient education guide, fact sheet on infection prevention and patient information card has been provided to patient /or left at bedside.    PICC Placement Documentation  PICC Single Lumen 01/05/24 Right Basilic 43 cm 0 cm (Active)  Indication for Insertion or Continuance of Line Prolonged intravenous therapies 01/05/24 1706  Exposed Catheter (cm) 0 cm 01/05/24 1706  Site Assessment Clean, Dry, Intact 01/05/24 1706  Line Status Flushed;Saline locked;Blood return noted 01/05/24 1706  Dressing Type Transparent;Securing device 01/05/24 1706  Dressing Status Antimicrobial disc/dressing in place 01/05/24 1706  Line Care Connections checked and tightened 01/05/24 1706  Line Adjustment (NICU/IV Team Only) No 01/05/24 1706  Dressing Intervention New dressing;Adhesive placed at insertion site (IV team only) 01/05/24 1706  Dressing Change Due 01/12/24 01/05/24 1706       Leita  Oseph Imburgia 01/05/2024, 5:08 PM

## 2024-01-05 NOTE — Plan of Care (Signed)
  Problem: Education: Goal: Knowledge of General Education information will improve Description: Including pain rating scale, medication(s)/side effects and non-pharmacologic comfort measures Outcome: Progressing   Problem: Health Behavior/Discharge Planning: Goal: Ability to manage health-related needs will improve Outcome: Progressing   Problem: Clinical Measurements: Goal: Respiratory complications will improve Outcome: Progressing Goal: Cardiovascular complication will be avoided Outcome: Progressing   Problem: Activity: Goal: Risk for activity intolerance will decrease Outcome: Progressing   Problem: Nutrition: Goal: Adequate nutrition will be maintained Outcome: Progressing   Problem: Coping: Goal: Level of anxiety will decrease Outcome: Progressing   Problem: Elimination: Goal: Will not experience complications related to bowel motility Outcome: Progressing Goal: Will not experience complications related to urinary retention Outcome: Progressing   Problem: Safety: Goal: Ability to remain free from injury will improve Outcome: Progressing

## 2024-01-05 NOTE — Plan of Care (Signed)
  Problem: Education: Goal: Knowledge of General Education information will improve Description: Including pain rating scale, medication(s)/side effects and non-pharmacologic comfort measures Outcome: Progressing   Problem: Clinical Measurements: Goal: Ability to maintain clinical measurements within normal limits will improve Outcome: Progressing   Problem: Clinical Measurements: Goal: Will remain free from infection Outcome: Progressing   Problem: Clinical Measurements: Goal: Respiratory complications will improve Outcome: Progressing   Problem: Clinical Measurements: Goal: Cardiovascular complication will be avoided Outcome: Progressing   Problem: Activity: Goal: Risk for activity intolerance will decrease Outcome: Progressing   Problem: Nutrition: Goal: Adequate nutrition will be maintained Outcome: Progressing   Problem: Elimination: Goal: Will not experience complications related to bowel motility Outcome: Progressing   Problem: Elimination: Goal: Will not experience complications related to urinary retention Outcome: Progressing   Problem: Safety: Goal: Ability to remain free from injury will improve Outcome: Progressing   Problem: Skin Integrity: Goal: Risk for impaired skin integrity will decrease Outcome: Progressing   Problem: Pain Managment: Goal: General experience of comfort will improve and/or be controlled Outcome: Not Progressing

## 2024-01-05 NOTE — TOC Progression Note (Addendum)
 Transition of Care [facility]) - Progression Note    Patient Details  Name: Kenneth Calhoun MRN: [id number] Date of Birth: Mar 23, 1961  [facility] CM/SW Contact  Doneta Glenys DASEN, RN Phone Number: 01/05/2024, 3:46 PM  Clinical Narrative:    CM spoke with patient in the room to discuss home IV infusion. Patient states his wife will be his support person for education. Awaiting OPAT orders from pharmacy. [employer] aware. Patient states he has a rolling walker.                     Expected Discharge Plan and Services                                               Social Drivers of Health (SDOH) Interventions SDOH Screenings   Food Insecurity: No Food Insecurity (12/30/2023)  Housing: Low Risk  (12/30/2023)  Transportation Needs: No Transportation Needs (12/30/2023)  Utilities: Not At Risk (12/30/2023)  Depression (PHQ2-9): Low Risk  (12/22/2023)  Financial Resource Strain: Low Risk  (12/22/2023)  Physical Activity: Inactive (12/22/2023)  Social Connections: Socially Integrated (12/22/2023)  Stress: No Stress Concern Present (12/22/2023)  Tobacco Use: Low Risk  (12/29/2023)    Readmission Risk Interventions    01/01/2024    9:32 AM  Readmission Risk Prevention Plan  Post Dischage Appt Complete  Medication Screening Complete  Transportation Screening Complete

## 2024-01-06 DIAGNOSIS — M10072 Idiopathic gout, left ankle and foot: Secondary | ICD-10-CM | POA: Diagnosis not present

## 2024-01-06 DIAGNOSIS — B9561 Methicillin susceptible Staphylococcus aureus infection as the cause of diseases classified elsewhere: Secondary | ICD-10-CM

## 2024-01-06 LAB — MAGNESIUM: Magnesium: 2.2 mg/dL (ref 1.7–2.4)

## 2024-01-06 LAB — BASIC METABOLIC PANEL WITH GFR
Anion gap: 8 (ref 5–15)
BUN: 24 mg/dL — ABNORMAL HIGH (ref 8–23)
CO2: 28 mmol/L (ref 22–32)
Calcium: 8.1 mg/dL — ABNORMAL LOW (ref 8.9–10.3)
Chloride: 100 mmol/L (ref 98–111)
Creatinine, Ser: 1.23 mg/dL (ref 0.61–1.24)
GFR, Estimated: >60 mL/min (ref >60)
Glucose, Bld: 112 mg/dL — ABNORMAL HIGH (ref 70–99)
Potassium: 4 mmol/L (ref 3.5–5.1)
Sodium: 136 mmol/L (ref 135–145)

## 2024-01-06 LAB — CBC
HCT: 43.2 % (ref 39.0–52.0)
Hemoglobin: 14.4 g/dL (ref 13.0–17.0)
MCH: 29.4 pg (ref 26.0–34.0)
MCHC: 33.3 g/dL (ref 30.0–36.0)
MCV: 88.2 fL (ref 80.0–100.0)
Platelets: 301 K/uL (ref 150–400)
RBC: 4.9 MIL/uL (ref 4.22–5.81)
RDW: 11.9 % (ref 11.5–15.5)
WBC: 16.6 K/uL — ABNORMAL HIGH (ref 4.0–10.5)
nRBC: 0 % (ref 0.0–0.2)

## 2024-01-06 LAB — SURGICAL PATHOLOGY

## 2024-01-06 MED ORDER — CEFAZOLIN IV (FOR PTA / DISCHARGE USE ONLY): 2.0000 g | Freq: Three times a day (TID) | INTRAVENOUS | 0 refills | Status: AC

## 2024-01-06 NOTE — Plan of Care (Signed)
  Problem: Education: Goal: Knowledge of General Education information will improve Description: Including pain rating scale, medication(s)/side effects and non-pharmacologic comfort measures Outcome: Progressing   Problem: Health Behavior/Discharge Planning: Goal: Ability to manage health-related needs will improve Outcome: Progressing   Problem: Clinical Measurements: Goal: Ability to maintain clinical measurements within normal limits will improve Outcome: Progressing   Problem: Activity: Goal: Risk for activity intolerance will decrease Outcome: Progressing   Problem: Coping: Goal: Level of anxiety will decrease Outcome: Progressing   Problem: Pain Managment: Goal: General experience of comfort will improve and/or be controlled Outcome: Progressing   Problem: Safety: Goal: Ability to remain free from injury will improve Outcome: Progressing   Problem: Skin Integrity: Goal: Risk for impaired skin integrity will decrease Outcome: Progressing

## 2024-01-06 NOTE — Progress Notes (Signed)
 PROGRESS NOTE    Kenneth Calhoun  FMW:[id number] DOB: [birth year] DOA: 12/30/2023 PCP: Nedra Tinnie LABOR, NP    Brief Narrative:   Kenneth Calhoun is a 63 y.o. male with past medical history of complex regional left lower extremity pain, being followed by pain management but not on narcotics as per patient,  and recent diagnosis of gout presented to the hospital with intractable left foot pain and inability to bear weight.  He is followed by [facility] and is on chronic Lyrica  as well as suzetrigine, as per the documentation  he has chronic left lower extremity pain typically 6 out of 10 despite medications.  He presented to orthopedic urgent care on 8/9 with approximately 4 days of tingling, swelling and redness of the left great toe. He was diagnosed with acute gout, prescribed colchicine  and Indocin .  He went to see his PCP on 8/11, they discontinued his colchicine  and NSAID due to chronic GFR of 50, instead prescribed prednisone  taper which patient had been taking.  Despite all of these interventions, his pain did not improve, and had difficulty ambulating so he again presented to the ED.  In the ED, patient was given IV Solu-Medrol  Dilaudid  and was admitted hospital for pain control.  At this time patient has undergone irrigation and debridement of the left foot, cultures have been sent and has been started on IV antibiotic.  PICC line has been placed in for possible long-term antibiotic.  Assessment and plan Principal Problem:   Gout attack Active Problems:   Obesity (BMI 30-39.9)   Mixed hyperlipidemia   CKD stage 3a, GFR 45-59 ml/min (HCC)    Acute flare of gout with intractable left foot pain Possible left foot osteomyelitis. X-ray of the foot without any acute abnormality.  Patient did not have any systemic signs of infection but complained of intractable pain despite being on colchicine  and steroids and opiates.  Orthopedics was consulted and had MRI of the foot was done  with multiple findings.  Patient then underwent left foot first metatarsophalangeal joint irrigation and debridement forefoot incision and drainage and bone biopsy on 01/04/2024.  Bone biopsy and tissue sample with staph aureus.  Sensitivity pending.  Patient is currently on cefazolin  and vancomycin .  ID on board and will follow recommendations...  On IV Solu-Medrol  for gout.   Mild leukocytosis secondary to steroids.  Will continue to monitor.   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  Patient follows up with [place] pain clinic.  I have instructed him to resume care with pain management after discharge.  Currently on Dilaudid  oxycodone  and still complains of pain.   CKD stage IIIa-with GFR 50, monitor renal function closely in house.  Latest creatinine of 1.2.    DVT prophylaxis: enoxaparin  (LOVENOX ) injection 40 mg Start: 01/05/24 1000 Refusing Lovenox   Code Status:     Code Status: Full Code  Disposition: Home likely in 1 to 2 days  Status is: Inpatient  The patient is inpatient because: IV antibiotic, IV analgesia, pending culture sensitivity and disposition plan   Family Communication: Spoke with the patient's son at bedside on 12/31/2023  Consultants:  Orthopedics Infectious disease.  Procedures:  Left forefoot incision and drainage, left foot first metatarsal phalangeal irrigation and debridement and biopsy on 01/04/2024  Antimicrobials:  Vancomycin  and cefazolin   Anti-infectives (From admission, onward)    Start     Dose/Rate Route Frequency Ordered Stop   01/05/24 1200  vancomycin  (VANCOREADY) IVPB 1250 mg/250  mL        1,250 mg 166.7 mL/hr over 90 Minutes Intravenous Every 24 hours 01/04/24 1055     01/04/24 1600  ceFAZolin  (ANCEF ) IVPB 2g/100 mL premix        2 g 200 mL/hr over 30 Minutes Intravenous Every 8 hours 01/04/24 1007     01/04/24 1200  vancomycin  (VANCOREADY) IVPB 2000 mg/400 mL        2,000 mg 200 mL/hr over  120 Minutes Intravenous  Once 01/04/24 1055 01/04/24 1415   01/04/24 1015  vancomycin  (VANCOCIN ) IVPB 1000 mg/200 mL premix  Status:  Discontinued        1,000 mg 200 mL/hr over 60 Minutes Intravenous Every 12 hours 01/04/24 1007 01/04/24 1054   01/04/24 0829  vancomycin  (VANCOCIN ) powder  Status:  Discontinued          As needed 01/04/24 0829 01/04/24 0859   01/04/24 0730  ceFAZolin  (ANCEF ) IVPB 2g/100 mL premix        2 g 200 mL/hr over 30 Minutes Intravenous On call to O.R. 01/04/24 9360 01/04/24 0803       Subjective: Today, patient was seen and examined at bedside.  Patient states that he could not sleep that much and still complains of pain especially on trying to bear weight.  Denies any fever chills nausea vomiting or shortness of breath.  Spoke regarding pain management as outpatient.  Objective: Vitals:   01/05/24 0533 01/05/24 1153 01/05/24 1933 01/06/24 0429  BP: 130/73 117/71 139/77 (!) 143/81  Pulse: 62 67 68 62  Resp: 20 18 18 18   Temp: 98.2 F (36.8 C) 97.9 F (36.6 C) 97.7 F (36.5 C) 97.8 F (36.6 C)  TempSrc: Oral Oral Oral Oral  SpO2: 96% 96% 98% 95%  Weight:      Height:        Intake/Output Summary (Last 24 hours) at 01/06/2024 1120 Last data filed at 01/06/2024 0813 Gross per 24 hour  Intake 909.86 ml  Output 1950 ml  Net -1040.14 ml   Filed Weights   12/30/23 0850  Weight: 95 kg    Physical Examination: Body mass index is 30.05 kg/m.   General:  Average built alert awake and Communicative. HENT:   No scleral pallor or icterus noted. Oral mucosa is moist.  Chest:  Clear breath sounds.   No crackles or wheezes.  CVS: S1 &S2 heard. No murmur.  Regular rate and rhythm. Abdomen: Soft, nontender, nondistended.  Bowel sounds are heard.   Extremities: No cyanosis, clubbing, dressing noted over the left foot. Psych: Alert, awake and oriented, normal mood CNS:  No cranial nerve deficits.  Power equal in all extremities.   Skin: Warm and dry.  No  rashes noted.    Data Reviewed:   CBC: Recent Labs  Lab 12/31/23 0434 01/02/24 0425 01/03/24 0525 01/06/24 0129  WBC 17.3* 15.0* 11.8* 16.6*  HGB 15.3 14.6 14.8 14.4  HCT 46.0 44.5 43.4 43.2  MCV 89.0 89.2 88.4 88.2  PLT 303 301 268 301    Basic Metabolic Panel: Recent Labs  Lab 12/31/23 0434 01/02/24 0425 01/03/24 0525 01/05/24 0430 01/06/24 0129  NA 136 135 136  --  136  K 4.3 3.9 3.8  --  4.0  CL 101 98 99  --  100  CO2 27 27 29   --  28  GLUCOSE 105* 97 94  --  112*  BUN 21 22 22   --  24*  CREATININE 1.23 1.22 1.21 1.30*  1.23  CALCIUM  8.7* 8.6* 8.5*  --  8.1*  MG  --   --   --   --  2.2    Liver Function Tests: No results for input(s): AST, ALT, ALKPHOS, BILITOT, PROT, ALBUMIN in the last 168 hours.   Radiology Studies: US  EKG SITE RITE Result Date: 01/05/2024 If Site Rite image not attached, placement could not be confirmed due to current cardiac rhythm.    LOS: 6 days    Vernal Alstrom, MD [facility] Available via Epic secure chat 7am-7pm After these hours, please refer to coverage provider listed on [email or web address] 01/06/2024, 11:20 AM

## 2024-01-06 NOTE — TOC Progression Note (Addendum)
 Transition of Care [facility]) - Progression Note    Patient Details  Name: Kenneth Calhoun MRN: [id number] Date of Birth: 1961-01-16  Transition of Care [facility]) CM/SW Contact  Doneta Glenys DASEN, RN Phone Number: 01/06/2024, 2:42 PM  Clinical Narrative:    CM has requested the MD to sign OPAT order and complete [facility] RN order. [facility] is aware of need for [facility] IV infusion and need for RN.                     Expected Discharge Plan and Services                                               Social Drivers of Health (SDOH) Interventions SDOH Screenings   Food Insecurity: No Food Insecurity (12/30/2023)  Housing: Low Risk  (12/30/2023)  Transportation Needs: No Transportation Needs (12/30/2023)  Utilities: Not At Risk (12/30/2023)  Depression (PHQ2-9): Low Risk  (12/22/2023)  Financial Resource Strain: Low Risk  (12/22/2023)  Physical Activity: Inactive (12/22/2023)  Social Connections: Socially Integrated (12/22/2023)  Stress: No Stress Concern Present (12/22/2023)  Tobacco Use: Low Risk  (12/29/2023)    Readmission Risk Interventions    01/01/2024    9:32 AM  Readmission Risk Prevention Plan  Post Dischage Appt Complete  Medication Screening Complete  Transportation Screening Complete

## 2024-01-06 NOTE — Consult Note (Signed)
 [facility]  Date of Admission:  12/30/2023   Total days of inpatient antibiotics 3  Principal Problem:   Gout attack Active Problems:   Obesity (BMI 30-39.9)   Mixed hyperlipidemia   CKD stage 3a, GFR 45-59 ml/min [facility])          Assessment: 63 year old male with history of OSA, hyperlipidemia, anxiety, left leg fracture in 2023 requiring Ex-Fix and I&D being managed for complex regional left lower extremity pain by [facility] (pain 6 out of 10 at baseline )presented with worsening wound.  He was seen by urgent care orthopedics on 8/9 with tingling left great toe, noted that he has stubbed his toe, history of gout as well as seen by PCP and discontinued his colchicine  NSAID due to GFR being 50 presents for management of left foot wound. 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  Or cultures and bone biopsy growing MSSA -Patient denies drug use.  Amenable to IV antibiotics outpatient.  Recommendations: -d/c vancomycin  -Continue cefazolin  to complete 6 weeks from OR EOT -Standard precuations -Communicated to primary  OPAT ORDERS:  Diagnosis: Left toe osteo  Culture Result: MSSA  No Known Allergies   Discharge antibiotics to be given via PICC line:  Per pharmacy protocol cefazolin  2g IV q8h    Duration: 6 weeks End Date: 02/15/24  [facility] Care Per Protocol with Biopatch Use: Home health RN for IV administration and teaching, line care and labs.    Labs weekly while on IV antibiotics: x__ CBC with differential __ BMP **TWICE WEEKLY ON VANCOMYCIN   __x CMP _x_ CRP _x_ ESR __ Vancomycin  trough TWICE WEEKLY __ CK  __ Please pull PIC at completion of IV antibiotics x__  Please leave PIC in place until doctor has seen patient or been notified  Fax weekly labs to [phone]  Clinic Follow Up Appt: 02/04/24  @ [facility] with Dr. Dennise   Microbiology:   Antibiotics: Cefazolin  8/24-present Vancomycin  8/24-present Cultures: 8/24 bone biopsy 8/24 or cultures MSSA   SUBJECTIVE: Resting in ved Interval: Afebrile overnight. Wbc 16k  Review of Systems: Review of Systems  All other systems reviewed and are negative.    Scheduled Meds:  Chlorhexidine  Gluconate Cloth  6 each Topical Daily   enoxaparin  (LOVENOX ) injection  40 mg Subcutaneous Daily   methylPREDNISolone  (SOLU-MEDROL ) injection  40 mg Intravenous Daily   pantoprazole   40 mg Oral Daily   pregabalin   100 mg Oral TID   sodium chloride  flush  10-40 mL Intracatheter Q12H   Continuous Infusions:   ceFAZolin  (ANCEF ) IV 2 g (01/06/24 0813)   PRN Meds:.acetaminophen  **OR** acetaminophen , albuterol , HYDROmorphone  (DILAUDID ) injection, meclizine , melatonin, ondansetron  **OR** ondansetron  (ZOFRAN ) IV, oxyCODONE , sodium chloride  flush No Known Allergies  OBJECTIVE: Vitals:   01/05/24 1153 01/05/24 1933 01/06/24 0429 01/06/24 1123  BP: 117/71 139/77 (!) 143/81 131/71  Pulse: 67 68 62 62  Resp: 18 18 18 17   Temp: 97.9 F (36.6 C) 97.7 F (36.5 C) 97.8 F (36.6 C) 97.9 F (36.6 C)  TempSrc: Oral Oral Oral Oral  SpO2: 96% 98% 95% 92%  Weight:      Height:       Body mass index is 30.05 kg/m.  Physical Exam  Constitutional:      General: He is not in acute distress.    Appearance: He is normal weight. He is not toxic-appearing.  HENT:     Head: Normocephalic and atraumatic.     Right Ear: External ear normal.     Left Ear: External ear normal.     Nose: No congestion or rhinorrhea.     Mouth/Throat:     Mouth: Mucous membranes are moist.     Pharynx: Oropharynx is clear.  Eyes:     Extraocular Movements: Extraocular movements intact.     Conjunctiva/sclera: Conjunctivae normal.      Pupils: Pupils are equal, round, and reactive to light.  Cardiovascular:     Rate and Rhythm: Normal rate and regular rhythm.     Heart sounds: No murmur heard.    No friction rub. No gallop.  Pulmonary:     Effort: Pulmonary effort is normal.     Breath sounds: Normal breath sounds.  Abdominal:     General: Abdomen is flat. Bowel sounds are normal.     Palpations: Abdomen is soft.  Musculoskeletal:        General: No swelling.     Cervical back: Normal range of motion and neck supple.     Comments: Left foot bandaged  Skin:    General: Skin is warm and dry.  Neurological:     General: No focal deficit present.     Mental Status: He is oriented to person, place, and time.  Psychiatric:        Mood and Affect: Mood normal.       Lab Results Lab Results  Component Value Date   WBC 16.6 (H) 01/06/2024   HGB 14.4 01/06/2024   HCT 43.2 01/06/2024   MCV 88.2 01/06/2024   PLT 301 01/06/2024    Lab Results  Component Value Date   CREATININE 1.23 01/06/2024   BUN 24 (H) 01/06/2024   NA 136 01/06/2024   K 4.0 01/06/2024   CL 100 01/06/2024   CO2 28 01/06/2024    Lab Results  Component Value Date   ALT 19 12/25/2022   AST 22 12/25/2022   ALKPHOS 75 12/25/2022   BILITOT 0.7 12/25/2022        Loney Stank, MD [facility] 01/06/2024, 2:37 PM

## 2024-01-06 NOTE — Plan of Care (Signed)

## 2024-01-06 NOTE — Progress Notes (Signed)
 PHARMACY CONSULT NOTE FOR:  OUTPATIENT  PARENTERAL ANTIBIOTIC THERAPY (OPAT)  Indication: Left big toe osteomyelitis Regimen: cefazolin  2g IV q8h End date: 02/15/24  IV antibiotic discharge orders are pended. To discharging provider:  please sign these orders via discharge navigator,  Select New Orders & click on the button choice - Manage This Unsigned Work.    Thank you for allowing pharmacy to be a part of this patient's care.  Izetta Carl, PharmD PGY1 Pharmacy Resident [facility]  01/06/2024 2:05 PM

## 2024-01-07 DIAGNOSIS — M10072 Idiopathic gout, left ankle and foot: Secondary | ICD-10-CM | POA: Diagnosis not present

## 2024-01-07 LAB — CBC
HCT: 42.9 % (ref 39.0–52.0)
Hemoglobin: 13.9 g/dL (ref 13.0–17.0)
MCH: 28.7 pg (ref 26.0–34.0)
MCHC: 32.4 g/dL (ref 30.0–36.0)
MCV: 88.5 fL (ref 80.0–100.0)
Platelets: 303 K/uL (ref 150–400)
RBC: 4.85 MIL/uL (ref 4.22–5.81)
RDW: 12 % (ref 11.5–15.5)
WBC: 15.4 K/uL — ABNORMAL HIGH (ref 4.0–10.5)
nRBC: 0 % (ref 0.0–0.2)

## 2024-01-07 LAB — BASIC METABOLIC PANEL WITH GFR
Anion gap: 12 (ref 5–15)
BUN: 21 mg/dL (ref 8–23)
CO2: 26 mmol/L (ref 22–32)
Calcium: 8.4 mg/dL — ABNORMAL LOW (ref 8.9–10.3)
Chloride: 101 mmol/L (ref 98–111)
Creatinine, Ser: 1.21 mg/dL (ref 0.61–1.24)
GFR, Estimated: >60 mL/min (ref >60)
Glucose, Bld: 89 mg/dL (ref 70–99)
Potassium: 3.9 mmol/L (ref 3.5–5.1)
Sodium: 139 mmol/L (ref 135–145)

## 2024-01-07 MED ORDER — MELATONIN 5 MG PO TABS
5.0000 mg | ORAL_TABLET | Freq: Every evening | ORAL | Status: DC | PRN
  Administered 2024-01-07: 5 mg via ORAL
  Filled 2024-01-07: qty 1

## 2024-01-07 NOTE — Plan of Care (Signed)

## 2024-01-07 NOTE — Progress Notes (Signed)
 PROGRESS NOTE    Kenneth Calhoun  FMW:[id number] DOB: 1960/10/04 DOA: 12/30/2023 PCP: Nedra Tinnie LABOR, NP    Brief Narrative:   Kenneth Calhoun is a 63 y.o. male with past medical history of complex regional left lower extremity pain, being followed by pain management but not on narcotics as per patient,  and recent diagnosis of gout presented to the hospital with intractable left foot pain and inability to bear weight.  He is followed by [facility] and is on chronic Lyrica  as well as suzetrigine, as per the documentation  he has chronic left lower extremity pain typically 6 out of 10 despite medications.  He presented to orthopedic urgent care on 8/9 with approximately 4 days of tingling, swelling and redness of the left great toe. He was diagnosed with acute gout, prescribed colchicine  and Indocin .  He went to see his PCP on 8/11, they discontinued his colchicine  and NSAID due to chronic GFR of 50, instead prescribed prednisone  taper which patient had been taking.  Despite all of these interventions, his pain did not improve, and had difficulty ambulating so he again presented to the ED.  In the ED, patient was given IV Solu-Medrol  Dilaudid  and was admitted hospital for pain control.  At this time patient has undergone irrigation and debridement of the left foot, cultures have been sent and has been started on IV antibiotic.  PICC line has been placed in for possible long-term antibiotic.  Assessment and plan Principal Problem:   Gout attack Active Problems:   Obesity (BMI 30-39.9)   Mixed hyperlipidemia   CKD stage 3a, GFR 45-59 ml/min (HCC)    Acute flare of gout with intractable left foot pain left foot Staphylococcus aureus osteomyelitis. X-ray of the foot without any acute abnormality.  Patient did not have any systemic signs of infection but complained of intractable pain despite being on colchicine  and steroids and opiates.  Orthopedics was consulted and had MRI of the  foot was done with multiple findings.  Patient then underwent left foot first metatarsophalangeal joint irrigation and debridement forefoot incision and drainage and bone biopsy on 01/04/2024.  Bone biopsy and tissue sample with staph aureus sensitive to multiple antibiotic.  ID was consulted.  Patient has been transitioned to cefazolin  to continue for 6 weeks on discharge.  Status post PICC line placement.  Will need to follow-up with orthopedics and ID as outpatient.  Mild leukocytosis secondary to steroids.  Will continue to monitor.   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  Patient follows up with [place] [facility].  I have instructed him to resume care with pain management after discharge.  Currently on Dilaudid  oxycodone  and still complains of pain.   CKD stage IIIa-with GFR 50, monitor renal function closely in house.  Latest creatinine of 1.2.    DVT prophylaxis: enoxaparin  (LOVENOX ) injection 40 mg Start: 01/05/24 1000 Refusing Lovenox   Code Status:     Code Status: Full Code  Disposition: Home likely on 02/08/2024  Status is: Inpatient  The patient is inpatient because: IV antibiotic, IV analgesia,    Family Communication: Spoke with the patient's son at bedside on 12/31/2023  Consultants:  Orthopedics Infectious disease.  Procedures:  Left forefoot incision and drainage, left foot first metatarsal phalangeal irrigation and debridement and biopsy on 01/04/2024  Antimicrobials:  cefazolin   Anti-infectives (From admission, onward)    Start     Dose/Rate Route Frequency Ordered Stop   01/06/24 0000  ceFAZolin  (  ANCEF ) IVPB        2 g Intravenous Every 8 hours 01/06/24 1447 02/15/24 2359   01/05/24 1200  vancomycin  (VANCOREADY) IVPB 1250 mg/250 mL  Status:  Discontinued        1,250 mg 166.7 mL/hr over 90 Minutes Intravenous Every 24 hours 01/04/24 1055 01/06/24 1359   01/04/24 1600  ceFAZolin  (ANCEF ) IVPB 2g/100 mL premix         2 g 200 mL/hr over 30 Minutes Intravenous Every 8 hours 01/04/24 1007     01/04/24 1200  vancomycin  (VANCOREADY) IVPB 2000 mg/400 mL        2,000 mg 200 mL/hr over 120 Minutes Intravenous  Once 01/04/24 1055 01/04/24 1415   01/04/24 1015  vancomycin  (VANCOCIN ) IVPB 1000 mg/200 mL premix  Status:  Discontinued        1,000 mg 200 mL/hr over 60 Minutes Intravenous Every 12 hours 01/04/24 1007 01/04/24 1054   01/04/24 0829  vancomycin  (VANCOCIN ) powder  Status:  Discontinued          As needed 01/04/24 0829 01/04/24 0859   01/04/24 0730  ceFAZolin  (ANCEF ) IVPB 2g/100 mL premix        2 g 200 mL/hr over 30 Minutes Intravenous On call to O.R. 01/04/24 9360 01/04/24 0803       Subjective: Today, patient was seen and examined at bedside.  Patient still complains of pain in his foot especially on bearing weight.  Discussed about pain management and follow-up with pain clinic.  Does not feel comfortable about discharge today. Objective: Vitals:   01/06/24 1123 01/06/24 2018 01/07/24 0440 01/07/24 1220  BP: 131/71 119/76 127/81 122/79  Pulse: 62 69 70 71  Resp: 17 18 18 18   Temp: 97.9 F (36.6 C) 98.1 F (36.7 C) 97.7 F (36.5 C)   TempSrc: Oral     SpO2: 92% 97% 93% 96%  Weight:      Height:        Intake/Output Summary (Last 24 hours) at 01/07/2024 1549 Last data filed at 01/07/2024 0930 Gross per 24 hour  Intake 450 ml  Output 400 ml  Net 50 ml   Filed Weights   12/30/23 0850  Weight: 95 kg    Physical Examination: Body mass index is 30.05 kg/m.   General:  Average built alert awake and Communicative. HENT:   No scleral pallor or icterus noted. Oral mucosa is moist.  Chest:  Clear breath sounds.   No crackles or wheezes.  CVS: S1 &S2 heard. No murmur.  Regular rate and rhythm. Abdomen: Soft, nontender, nondistended.  Bowel sounds are heard.   Extremities: No cyanosis, clubbing, left foot with dressing.  Capillary refill present. Psych: Alert, awake and oriented,  normal mood CNS:  No cranial nerve deficits.  Power equal in all extremities.   Skin: Warm and dry.  No rashes noted.    Data Reviewed:   CBC: Recent Labs  Lab 01/02/24 0425 01/03/24 0525 01/06/24 0129 01/07/24 0210  WBC 15.0* 11.8* 16.6* 15.4*  HGB 14.6 14.8 14.4 13.9  HCT 44.5 43.4 43.2 42.9  MCV 89.2 88.4 88.2 88.5  PLT 301 268 301 303    Basic Metabolic Panel: Recent Labs  Lab 01/02/24 0425 01/03/24 0525 01/05/24 0430 01/06/24 0129 01/07/24 0210  NA 135 136  --  136 139  K 3.9 3.8  --  4.0 3.9  CL 98 99  --  100 101  CO2 27 29  --  28 26  GLUCOSE  97 94  --  112* 89  BUN 22 22  --  24* 21  CREATININE 1.22 1.21 1.30* 1.23 1.21  CALCIUM  8.6* 8.5*  --  8.1* 8.4*  MG  --   --   --  2.2  --     Liver Function Tests: No results for input(s): AST, ALT, ALKPHOS, BILITOT, PROT, ALBUMIN in the last 168 hours.   Radiology Studies: No results found.    LOS: 7 days    Vernal Alstrom, MD [facility] Available via Epic secure chat 7am-7pm After these hours, please refer to coverage provider listed on [email or web address] 01/07/2024, 3:49 PM

## 2024-01-07 NOTE — Plan of Care (Signed)
  Problem: Education: Goal: Knowledge of General Education information will improve Description: Including pain rating scale, medication(s)/side effects and non-pharmacologic comfort measures Outcome: Progressing   Problem: Clinical Measurements: Goal: Will remain free from infection Outcome: Progressing Goal: Respiratory complications will improve Outcome: Progressing Goal: Cardiovascular complication will be avoided Outcome: Progressing   Problem: Activity: Goal: Risk for activity intolerance will decrease Outcome: Progressing   Problem: Nutrition: Goal: Adequate nutrition will be maintained Outcome: Progressing   Problem: Coping: Goal: Level of anxiety will decrease Outcome: Progressing

## 2024-01-07 NOTE — Progress Notes (Signed)
 PT Cancellation Note  Patient Details Name: Kenneth Calhoun MRN: [id number] DOB: Jun 15, 1960   Cancelled Treatment:    Reason Eval/Treat Not Completed:  Plan was to evaluate pt this a.m however per RN (via secure chat) pt is requesting PT be held until after lunch and next dose of pain meds. Will check back as schedule allows.    Dannial SQUIBB, PT Acute Rehabilitation  Office: [phone]

## 2024-01-07 NOTE — Evaluation (Signed)
 Physical Therapy Evaluation Patient Details Name: Kenneth Calhoun MRN: [id number] DOB: 11-Oct-1960 Today's Date: 01/07/2024  History of Present Illness  63 yo male admitted with gout. S/P L foot 1st MTP irrigation and debridement/forefoot incision and drainage/1st MTP dorsal cheilectomy and bone biopsy 8/24. Hx of CRPS, gout, L tibia/fibular ORIF  Clinical Impression  On eval, pt was Mod Ind with mobility. He walked ~75 feet with a RW. Pain rated 7/10 with activity-reports nerve pain-pt was premedicated prior to session. He was able to don [facility] shoe independently. Recommended he wear a normal shoe on non operative side when ambulating as well. Recommend continued use of RW for ambulation safety. Pt to f/u with surgeon-will defer any f/u PT recommendations to surgeon. Pt reports he has all DME needed.         If plan is discharge home, recommend the following:     Can travel by private vehicle        Equipment Recommendations None recommended by PT  Recommendations for Other Services       Functional Status Assessment Patient has had a recent decline in their functional status and demonstrates the ability to make significant improvements in function in a reasonable and predictable amount of time.     Precautions / Restrictions Precautions Precautions: Fall Required Braces or Orthoses: Other Brace Other Brace: DARCO post op shoe Restrictions Weight Bearing Restrictions Per Provider Order: Yes Other Position/Activity Restrictions: WB thru heel with DARCO shoe      Mobility  Bed Mobility Overal bed mobility: Modified Independent                  Transfers Overall transfer level: Modified independent Equipment used: Rolling walker (2 wheels)                    Ambulation/Gait Ambulation/Gait assistance: Modified independent (Device/Increase time) Gait Distance (Feet): 75 Feet Assistive device: Rolling walker (2 wheels) Gait Pattern/deviations: Step-to  pattern       General Gait Details: Good sequencing. Good adherence to WB status. Mild fatigue and dyspnea towards end of distance. Recommended wearing a normal shoe on non surgical foot when ambulating.  Stairs            Wheelchair Mobility     Tilt Bed    Modified Rankin (Stroke Patients Only)       Balance Overall balance assessment: Needs assistance         Standing balance support: During functional activity, Reliant on assistive device for balance Standing balance-Leahy Scale: Fair                               Pertinent Vitals/Pain Pain Assessment Pain Assessment: 0-10 Pain Score: 6  Pain Location: 6/10 at rest L foot; 7/10 with activity Pain Descriptors / Indicators: Tingling, Aching (nerve pain) Pain Intervention(s): Monitored during session, Premedicated before session    Home Living Family/patient expects to be discharged to:: Private residence Living Arrangements: Spouse/significant other Available Help at Discharge: Family Type of Home: House Home Access: Ramped entrance       Home Layout: One level Home Equipment: Wheelchair - Forensic psychologist (2 wheels);Crutches Additional Comments: knee scooter    Prior Function Prior Level of Function : Independent/Modified Independent             Mobility Comments: independent       Extremity/Trunk Assessment   Upper Extremity Assessment Upper Extremity Assessment: Overall  WFL for tasks assessed    Lower Extremity Assessment Lower Extremity Assessment: Generalized weakness (L foot dressed post op)    Cervical / Trunk Assessment Cervical / Trunk Assessment: Normal  Communication   Communication Communication: No apparent difficulties    Cognition Arousal: Alert Behavior During Therapy: WFL for tasks assessed/performed   PT - Cognitive impairments: No apparent impairments                         Following commands: Intact       Cueing Cueing  Techniques: Verbal cues     General Comments      Exercises     Assessment/Plan    PT Assessment All further PT needs can be met in the next venue of care (will defer any f/u PT recs to surgeon)  PT Problem List Decreased range of motion;Decreased activity tolerance;Decreased balance;Decreased mobility;Pain;Decreased knowledge of use of DME;Decreased skin integrity       PT Treatment Interventions      PT Goals (Current goals can be found in the Care Plan section)  Acute Rehab PT Goals Patient Stated Goal: pain to remain controlled. regain PLOF PT Goal Formulation: All assessment and education complete, DC therapy    Frequency       Co-evaluation               AM-PAC PT 6 Clicks Mobility  Outcome Measure Help needed turning from your back to your side while in a flat bed without using bedrails?: None Help needed moving from lying on your back to sitting on the side of a flat bed without using bedrails?: None Help needed moving to and from a bed to a chair (including a wheelchair)?: None Help needed standing up from a chair using your arms (e.g., wheelchair or bedside chair)?: None Help needed to walk in hospital room?: None Help needed climbing 3-5 steps with a railing? : A Little 6 Click Score: 23    End of Session Equipment Utilized During Treatment: Gait belt (DARCO shoe) Activity Tolerance: Patient tolerated treatment well Patient left: in bed;with call bell/phone within reach   PT Visit Diagnosis: Pain Pain - Right/Left: Left Pain - part of body: Ankle and joints of foot    Time: 1351-1419 PT Time Calculation (min) (ACUTE ONLY): 28 min   Charges:   PT Evaluation $PT Eval Low Complexity: 1 Low PT Treatments $Gait Training: 8-22 mins PT General Charges $$ ACUTE PT VISIT: 1 Visit            Dannial SQUIBB, PT Acute Rehabilitation  Office: [phone]

## 2024-01-08 DIAGNOSIS — M869 Osteomyelitis, unspecified: Secondary | ICD-10-CM | POA: Diagnosis not present

## 2024-01-08 MED ORDER — OXYCODONE HCL 10 MG PO TABS: 10.0000 mg | ORAL_TABLET | ORAL | 0 refills | Status: AC | PRN

## 2024-01-08 NOTE — TOC Transition Note (Signed)
 Transition of Care [facility]) - Discharge Note   Patient Details  Name: Kenneth Calhoun MRN: [id number] Date of Birth: [birth year]  [facility]) CM/SW Contact:  Doneta Glenys DASEN, RN Phone Number: 01/08/2024, 11:03 AM   Clinical Narrative:    Per MD patient is medically ready for discharge. [facility] will provide medications and nurse coverage. Patient states he has rolling walker at home. IP CM signing off.   Final next level of care: Home w Home Health Services Barriers to Discharge: Barriers Resolved   Patient Goals and CMS Choice Patient states their goals for this hospitalization and ongoing recovery are:: Home with [facility]          Discharge Placement                       Discharge Plan and Services Additional resources added to the After Visit Summary for                            [facility] Arranged: RN [facility] Agency: [facility] Date [facility] Agency Contacted: 01/05/24 Time HH Agency Contacted: 1100 Representative spoke with at [facility] Agency: Pam  Social Drivers of Health (SDOH) Interventions SDOH Screenings   Food Insecurity: No Food Insecurity (12/30/2023)  Housing: Low Risk  (12/30/2023)  Transportation Needs: No Transportation Needs (12/30/2023)  Utilities: Not At Risk (12/30/2023)  Depression (PHQ2-9): Low Risk  (12/22/2023)  Financial Resource Strain: Low Risk  (12/22/2023)  Physical Activity: Inactive (12/22/2023)  Social Connections: Socially Integrated (12/22/2023)  Stress: No Stress Concern Present (12/22/2023)  Tobacco Use: Low Risk  (12/29/2023)     Readmission Risk Interventions    01/01/2024    9:32 AM  Readmission Risk Prevention Plan  Post Dischage Appt Complete  Medication Screening Complete  Transportation Screening Complete

## 2024-01-08 NOTE — Discharge Summary (Signed)
 Physician Discharge Summary  Kenneth Calhoun FMW:[id number] DOB: [birth year] DOA: 12/30/2023  PCP: Nedra Tinnie LABOR, NP  Admit date: 12/30/2023 Discharge date: 01/08/2024  Admitted From: Home  Discharge disposition: Home   Recommendations for Outpatient Follow-Up:   Follow up with your primary care provider in one week.  Check CBC, BMP, magnesium in the next visit Follow-up with orthopedics in 1 week. Follow-up with infectious disease clinic as has been scheduled on 02/05/2024   Discharge Diagnosis:   Principal Problem:   Osteomyelitis (HCC) Active Problems:   Obesity (BMI 30-39.9)   Mixed hyperlipidemia   CKD stage 3a, GFR 45-59 ml/min (HCC)   Gout attack   Discharge Condition: Improved.  Diet recommendation: Regular diet  Wound care: Continue current dressing  Code status: Full.   History of Present Illness:   Kenneth Calhoun is a 63 y.o. male with past medical history of complex regional left lower extremity pain, being followed by pain management but not on narcotics as per patient,  and recent diagnosis of gout presented to the hospital with intractable left foot pain and inability to bear weight.  He is followed by [facility] and is on chronic Lyrica  as well as suzetrigine, as per the documentation  he has chronic left lower extremity pain typically 6 out of 10 despite medications.  He presented to orthopedic urgent care on 8/9 with approximately 4 days of tingling, swelling and redness of the left great toe. He was diagnosed with acute gout, prescribed colchicine  and Indocin .  He went to see his PCP on 8/11, they discontinued his colchicine  and NSAID due to chronic GFR of 50, instead prescribed prednisone  taper which patient had been taking.  Despite all of these interventions, his pain did not improve, and had difficulty ambulating so he again presented to the ED.  In the ED, patient was given IV Solu-Medrol  Dilaudid  and was admitted hospital for pain  control.     Hospital Course:   Following conditions were addressed during hospitalization as listed below,  Acute flare of gout with intractable left foot pain left foot Staphylococcus aureus osteomyelitis. X-ray of the foot without any acute abnormality.  Patient did not have any systemic signs of infection but complained of intractable pain despite being on colchicine  and steroids and opiates.  Orthopedics was consulted and had MRI of the foot was done with multiple findings.  Patient then underwent left foot first metatarsophalangeal joint irrigation and debridement forefoot incision and drainage and bone biopsy on 01/04/2024.  Bone biopsy and tissue sample with staph aureus sensitive to multiple antibiotic.  ID was consulted.  Patient has been transitioned to cefazolin  to continue for 6 weeks on discharge.  Status post PICC line placement.  Will need to follow-up with orthopedics and ID as outpatient.   Mild leukocytosis secondary to steroids.  Follow-up as outpatient.   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  Patient follows up with [place] pain clinic.  I have instructed him to resume care with pain management after discharge.  Will continue oxycodone  10 mg at home including Lyrica  on discharge.   CKD stage IIIa-with GFR 50, monitor renal function closely in house.  Latest creatinine of 1.2.   Disposition.  At this time, patient is stable for disposition home with outpatient PCP, pain management and orthopedic/ID follow-up.  Medical Consultants:   Orthopedics Infectious disease.  Procedures:    Left forefoot incision and drainage, left foot first metatarsal phalangeal irrigation and debridement  and biopsy on 01/04/2024  PICC line placement Subjective:   Today, patient was seen and examined at bedside.  Complains of mild foot discomfort.  No nausea vomiting fever chills or rigor.  Discharge Exam:   Vitals:   01/07/24 1940 01/08/24  0615  BP: 139/71 (!) 144/80  Pulse: 71 (!) 59  Resp: 18 16  Temp: (!) 97.5 F (36.4 C) 97.9 F (36.6 C)  SpO2: 97% 97%   Vitals:   01/07/24 0440 01/07/24 1220 01/07/24 1940 01/08/24 0615  BP: 127/81 122/79 139/71 (!) 144/80  Pulse: 70 71 71 (!) 59  Resp: 18 18 18 16   Temp: 97.7 F (36.5 C)  (!) 97.5 F (36.4 C) 97.9 F (36.6 C)  TempSrc:   Oral   SpO2: 93% 96% 97% 97%  Weight:      Height:       Body mass index is 30.05 kg/m.  General: Alert awake, not in obvious distress HENT: pupils equally reacting to light,  No scleral pallor or icterus noted. Oral mucosa is moist.  Chest:  Clear breath sounds.  . No crackles or wheezes.  CVS: S1 &S2 heard. No murmur.  Regular rate and rhythm. Abdomen: Soft, nontender, nondistended.  Bowel sounds are heard.   Extremities: No cyanosis, clubbing, left foot with dressing peripheral pulses are palpable. Psych: Alert, awake and oriented, normal mood CNS:  No cranial nerve deficits.  Power equal in all extremities.   Skin: Warm and dry.  Left foot with dressing.  The results of significant diagnostics from this hospitalization (including imaging, microbiology, ancillary and laboratory) are listed below for reference.     Diagnostic Studies:   MR FOOT LEFT W WO CONTRAST Result Date: 01/01/2024 CLINICAL DATA:  Soft tissue infection suspected, foot, xray done EXAM: MRI OF THE LEFT FOREFOOT WITHOUT AND WITH CONTRAST TECHNIQUE: Multiplanar, multisequence MR imaging of the left foot was performed both before and after administration of intravenous contrast. CONTRAST:  9mL GADAVIST  GADOBUTROL  1 MMOL/ML IV SOLN COMPARISON:  Left foot radiographs dated 12/29/2023. FINDINGS: Bones/Joint/Cartilage Moderate arthropathy of the first MTP joint with joint space narrowing, osteophytosis, and subchondral cystic change. No discrete erosion identified. Periarticular marrow edema about the first MTP joint involving the head of the first metatarsal and the base of  the first proximal phalanx with corresponding T1 hypointensity and enhancement on postcontrast sequences. First MTP joint effusion with a 2.2 x 2.0 x 1.1 cm peripherally enhancing fluid-filled cystic outpouching laterally and additional similar-appearing cystic focus insinuating from the plantar lateral margin of the first MTP joint measuring approximately 1.5 x 1.2 x 0.7 cm (series 7, images 10-15 and series 5, images 24-31). There is surrounding soft tissue edema and enhancement. The remainder of the bones demonstrate normal marrow signal. No fracture or dislocation. Mild degenerative arthropathy at the Lisfranc joint and midfoot. Ligaments Lisfranc ligament is intact. Muscles and Tendons Edema and enhancement of the intrinsic foot musculature, predominantly involving the flexor and adductor hallucis muscles, surrounding the first MTP joint and fluid-filled outpouchings, as described above. No significant tenosynovitis. Soft tissue Subcutaneous edema extending along the dorsal foot. IMPRESSION: Moderate arthropathy of the first MTP joint with periarticular marrow edema involving the head of the first metatarsal and the base of the first proximal phalanx with corresponding T1 hypointensity and enhancement on postcontrast sequences. No discrete osseous erosion identified. First MTP joint effusion with a 2.2 x 2.0 x 1.1 cm peripherally enhancing fluid-filled cystic outpouching laterally and additional similar-appearing cystic focus insinuating from the  plantar lateral margin of the first MTP joint measuring approximately 1.5 x 1.2 x 0.7 cm. There is surrounding soft tissue and intramuscular edema and enhancement. Septic arthritis of the first MTP joint with associated soft tissue infection and possible early osteomyelitis cannot be excluded. Additional differential considerations include inflammatory arthropathy. Recommend correlation with clinical/laboratory findings. Electronically Signed   By: Harrietta Sherry  M.D.   On: 01/01/2024 09:12     Labs:   Basic Metabolic Panel: Recent Labs  Lab 01/02/24 0425 01/03/24 0525 01/05/24 0430 01/06/24 0129 01/07/24 0210  NA 135 136  --  136 139  K 3.9 3.8  --  4.0 3.9  CL 98 99  --  100 101  CO2 27 29  --  28 26  GLUCOSE 97 94  --  112* 89  BUN 22 22  --  24* 21  CREATININE 1.22 1.21 1.30* 1.23 1.21  CALCIUM  8.6* 8.5*  --  8.1* 8.4*  MG  --   --   --  2.2  --    GFR Estimated Creatinine Clearance: 72.3 mL/min (by C-G formula based on SCr of 1.21 mg/dL). Liver Function Tests: No results for input(s): AST, ALT, ALKPHOS, BILITOT, PROT, ALBUMIN in the last 168 hours. No results for input(s): LIPASE, AMYLASE in the last 168 hours. No results for input(s): AMMONIA in the last 168 hours. Coagulation profile No results for input(s): INR, PROTIME in the last 168 hours.  CBC: Recent Labs  Lab 01/02/24 0425 01/03/24 0525 01/06/24 0129 01/07/24 0210  WBC 15.0* 11.8* 16.6* 15.4*  HGB 14.6 14.8 14.4 13.9  HCT 44.5 43.4 43.2 42.9  MCV 89.2 88.4 88.2 88.5  PLT 301 268 301 303   Cardiac Enzymes: No results for input(s): CKTOTAL, CKMB, CKMBINDEX, TROPONINI in the last 168 hours. BNP: Invalid input(s): POCBNP CBG: No results for input(s): GLUCAP in the last 168 hours. D-Dimer No results for input(s): DDIMER in the last 72 hours. Hgb A1c No results for input(s): HGBA1C in the last 72 hours. Lipid Profile No results for input(s): CHOL, HDL, LDLCALC, TRIG, CHOLHDL, LDLDIRECT in the last 72 hours. Thyroid  function studies No results for input(s): TSH, T4TOTAL, T3FREE, THYROIDAB in the last 72 hours.  Invalid input(s): FREET3 Anemia work up No results for input(s): VITAMINB12, FOLATE, FERRITIN, TIBC, IRON, RETICCTPCT in the last 72 hours. Microbiology Recent Results (from the past 240 hours)  Aerobic/Anaerobic Culture w Gram Stain (surgical/deep wound)     Status: None  (Preliminary result)   Collection Time: 01/04/24  8:16 AM   Specimen: PATH Soft tissue  Result Value Ref Range Status   Specimen Description   Final    TISSUE A SOFT Performed at [facility], [address] [place], [place] [address]    Special Requests   Final    NONE Performed at [facility], [address], [place], [place] [address]    Gram Stain   Final    RARE WBC PRESENT, PREDOMINANTLY PMN RARE GRAM POSITIVE COCCI Performed at [facility], [address], [place], [place] [address]    Culture   Final    FEW STAPHYLOCOCCUS AUREUS SUSCEPTIBILITIES PERFORMED ON PREVIOUS CULTURE WITHIN THE LAST 5 DAYS. NO ANAEROBES ISOLATED; CULTURE IN PROGRESS FOR 5 DAYS    Report Status PENDING  Incomplete  Aerobic/Anaerobic Culture w Gram Stain (surgical/deep wound)     Status: None (Preliminary result)   Collection Time: 01/04/24  8:20 AM   Specimen: Wound  Result Value Ref Range Status   Specimen Description   Final    WOUND Performed at [facility], [address], [place], [place] [address]    Special Requests   Final    NONE Performed at [facility], [address] [place], [place] [address]    Gram Stain   Final    RARE WBC PRESENT, PREDOMINANTLY PMN FEW GRAM POSITIVE COCCI Performed at [facility], [address], [place], [place] [address]    Culture   Final    MODERATE STAPHYLOCOCCUS AUREUS NO ANAEROBES ISOLATED; CULTURE IN PROGRESS FOR 5 DAYS    Report Status PENDING  Incomplete   Organism ID, Bacteria STAPHYLOCOCCUS AUREUS  Final      Susceptibility   Staphylococcus aureus - MIC*    CIPROFLOXACIN <=0.5 SENSITIVE Sensitive     ERYTHROMYCIN <=0.25 SENSITIVE Sensitive     GENTAMICIN <=0.5 SENSITIVE Sensitive     OXACILLIN 0.5 SENSITIVE Sensitive     TETRACYCLINE <=1 SENSITIVE Sensitive     VANCOMYCIN  <=0.5 SENSITIVE Sensitive     TRIMETH/SULFA <=10 SENSITIVE Sensitive      CLINDAMYCIN <=0.25 SENSITIVE Sensitive     RIFAMPIN <=0.5 SENSITIVE Sensitive     Inducible Clindamycin NEGATIVE Sensitive     LINEZOLID 2 SENSITIVE Sensitive     * MODERATE STAPHYLOCOCCUS AUREUS  Aerobic/Anaerobic Culture w Gram Stain (surgical/deep wound)     Status: None (Preliminary result)   Collection Time: 01/04/24  8:21 AM   Specimen: Wound  Result Value Ref Range Status   Specimen Description   Final    WOUND C Performed at [facility], [address], [place], [place] [address]    Special Requests   Final    NONE Performed at [facility], [address] [place], [place] [address]    Gram Stain   Final    RARE WBC PRESENT, PREDOMINANTLY PMN FEW GRAM POSITIVE COCCI Performed at [facility], [address], [place], [place] [address]    Culture   Final    FEW STAPHYLOCOCCUS AUREUS SUSCEPTIBILITIES PERFORMED ON PREVIOUS CULTURE WITHIN THE LAST 5 DAYS. NO ANAEROBES ISOLATED; CULTURE IN PROGRESS FOR 5 DAYS    Report Status PENDING  Incomplete  Aerobic/Anaerobic Culture w Gram Stain (surgical/deep wound)     Status: None (Preliminary result)   Collection Time: 01/04/24  8:59 AM   Specimen: PATH Bone biopsy; Tissue  Result Value Ref Range Status   Specimen Description   Final    BONE BIOPSY D Performed at [facility], [address], [place], [place] [address]    Special Requests   Final    NONE Performed at [facility], [address] [place], [place] [address]    Gram Stain   Final    FEW WBC PRESENT, PREDOMINANTLY PMN MODERATE GRAM POSITIVE COCCI INTRACELLULAR Performed at [facility], [address], [place], [place] [address]    Culture   Final    FEW STAPHYLOCOCCUS AUREUS SUSCEPTIBILITIES PERFORMED ON PREVIOUS CULTURE WITHIN THE LAST 5 DAYS. NO ANAEROBES ISOLATED; CULTURE IN PROGRESS FOR 5 DAYS    Report Status PENDING  Incomplete     Discharge Instructions:    Discharge Instructions     [facility] pharmacist to adjust dose for Vancomycin , Aminoglycosides and other anti-infective therapies as requested by physician.   Complete by: As directed    [facility] to provide  Cath Flo 2mg    Complete by: As directed    Administer for PICC line occlusion and as ordered by physician for other access device issues.   Anaphylaxis Kit: Provided to treat any anaphylactic reaction to the medication being provided to the patient if First Dose or when requested by physician   Complete by: As directed    Epinephrine  1mg /ml vial / amp: Administer 0.3mg  (0.41ml) subcutaneously once for moderate to severe anaphylaxis, nurse to call physician and pharmacy when reaction occurs and call 911 if needed for immediate care   Diphenhydramine  50mg /ml IV vial: Administer 25-50mg  IV/IM PRN for first dose reaction, rash, itching, mild reaction, nurse to call physician and pharmacy when reaction occurs   Sodium Chloride  0.9% NS 500ml IV: Administer if needed for hypovolemic blood pressure drop or as ordered by physician after call to physician with anaphylactic reaction   Call MD for:  persistant nausea and vomiting   Complete by: As directed    Call MD for:  redness, tenderness, or signs of infection (pain, swelling, redness, odor or green/yellow discharge around incision site)   Complete by: As directed    Call MD for:  severe uncontrolled pain   Complete by: As directed    Call MD for:  temperature >100.4   Complete by: As directed    Change dressing on IV access line weekly and PRN   Complete by: As directed    Diet general   Complete by: As directed    Discharge instructions   Complete by: As directed    Keep your affected leg elevated while sitting or lying down.  Follow-up with Dr. Barton orthopedics as has been scheduled on Tuesday.  Follow-up with your pain management clinic for further treatment of pain.  Continue antibiotics at home as per  home health.  Follow-up with infectious disease clinic as scheduled by the clinic.   Flush IV access with Sodium Chloride  0.9% and Heparin 10 units/ml or 100 units/ml   Complete by: As directed    Home infusion instructions - [facility]   Complete by: As directed    Instructions: Flush IV access with Sodium Chloride  0.9% and Heparin 10units/ml or 100units/ml   Change dressing on IV access line: Weekly and PRN   Instructions Cath Flo 2mg : Administer for PICC Line occlusion and as ordered by physician for other access device   [facility] pharmacist to adjust dose for: Vancomycin , Aminoglycosides and other anti-infective therapies as requested by physician   Increase activity slowly   Complete by: As directed    Method of administration may be changed at the discretion of home infusion pharmacist based upon assessment of the patient and/or caregiver's ability to self-administer the medication ordered   Complete by: As directed    No wound care   Complete by: As directed    Outpatient Parenteral Antibiotic Therapy Information Antibiotic: Cefazolin  (Ancef ) IVPB; Indications for use: osteomyelitis; End Date: 02/15/2024   Complete by: As directed    Antibiotic: Cefazolin  (Ancef ) IVPB   Indications for use: osteomyelitis   End Date: 02/15/2024      Allergies as of 01/08/2024   No Known Allergies      Medication List     STOP taking these medications    colchicine  0.6 MG tablet   indomethacin  75 MG CR capsule Commonly known as: INDOCIN  SR   predniSONE  10 MG (21) Tbpk tablet Commonly known as: STERAPRED UNI-PAK 21 TAB       TAKE  these medications    ceFAZolin  IVPB Commonly known as: ANCEF  Inject 2 g into the vein every 8 (eight) hours. Indication:  L big toe osteomyelitis First Dose: Yes Last Day of Therapy:  02/15/24 Labs - Once weekly:  CBC/D and BMP, Labs - Once weekly: ESR and CRP Method of administration: IV Push Method of administration may be  changed at the discretion of home infusion pharmacist based upon assessment of the patient and/or caregiver's ability to self-administer the medication ordered.   meclizine  25 MG tablet Commonly known as: ANTIVERT  Take 1 tablet (25 mg total) by mouth 3 (three) times daily as needed for dizziness. What changed: when to take this   Oxycodone  HCl 10 MG Tabs Take 1 tablet (10 mg total) by mouth every 4 (four) hours as needed for up to 7 days for moderate pain (pain score 4-6) or severe pain (pain score 7-10) (pain).   pregabalin  100 MG capsule Commonly known as: LYRICA  Take 100 mg by mouth in the morning, at noon, in the evening, and at bedtime.   Zepbound  7.5 MG/0.5ML injection vial Generic drug: tirzepatide  INJECT 0.5 ML (7.5 MG) UNDER THE SKIN ONCE WEEKLY (0.5ML= 50 UNITS)               Discharge Care Instructions  (From admission, onward)           Start     Ordered   01/06/24 0000  Change dressing on IV access line weekly and PRN  (Home infusion instructions - [facility] )        01/06/24 1447            Follow-up Information     McElwee, Lauren A, NP Follow up in 1 week(s).   Specialty: Internal Medicine Contact information: [address] [place] [address] [phone]         Barton Drape, MD Follow up on 01/13/2024.   Specialty: Orthopedic Surgery Why: wound check Contact information: [address], [address] [place] [address] [phone]         Dennise Kingsley, MD Follow up on 02/05/2024.   Specialty: Infectious Diseases Contact information: [address], [address] [place] [address] [phone]                  Time coordinating discharge: 39 minutes  Signed:  Mckinley Adelstein  [facility] 01/08/2024, 9:45 AM

## 2024-01-08 NOTE — Plan of Care (Signed)

## 2024-01-09 ENCOUNTER — Telehealth: Payer: Self-pay

## 2024-01-09 LAB — AEROBIC/ANAEROBIC CULTURE W GRAM STAIN (SURGICAL/DEEP WOUND)

## 2024-01-09 NOTE — Telephone Encounter (Signed)
 Noted

## 2024-01-09 NOTE — Transitions of Care (Post Inpatient/ED Visit) (Signed)
 01/09/2024  Name: Kenneth Calhoun MRN: [id number] DOB: 1960/10/13  Today's TOC FU Call Status: Today's TOC FU Call Status:: Successful TOC FU Call Completed Unsuccessful Call (1st Attempt) Date: 01/09/24 [facility] FU Call Complete Date: 01/09/24 Patient's Name and Date of Birth confirmed.  Transition Care Management Follow-up Telephone Call Date of Discharge: 01/08/24 Discharge Facility: [facility]) Type of Discharge: Inpatient Admission Primary Inpatient Discharge Diagnosis:: osteo How have you been since you were released from the hospital?: Better Any questions or concerns?: No  Items Reviewed: Did you receive and understand the discharge instructions provided?: Yes Medications obtained,verified, and reconciled?: Yes (Medications Reviewed) Any new allergies since your discharge?: Yes Dietary orders reviewed?: Yes Do you have support at home?: Yes People in Home [RPT]: spouse  Medications Reviewed Today: Medications Reviewed Today     Reviewed by Emmitt Pan, LPN (Licensed Practical Nurse) on 01/09/24 at 1055  Med List Status: <None>   Medication Order Taking? Sig Documenting Provider Last Dose Status Informant  ceFAZolin  (ANCEF ) IVPB [id number] Yes Inject 2 g into the vein every 8 (eight) hours. Indication:  L big toe osteomyelitis First Dose: Yes Last Day of Therapy:  02/15/24 Labs - Once weekly:  CBC/D and BMP, Labs - Once weekly: ESR and CRP Method of administration: IV Push Method of administration may be changed at the discretion of home infusion pharmacist based upon assessment of the patient and/or caregiver's ability to self-administer the medication ordered. Pokhrel, Laxman, MD  Active   meclizine  (ANTIVERT ) 25 MG tablet [id number] Yes Take 1 tablet (25 mg total) by mouth 3 (three) times daily as needed for dizziness.  Patient taking differently: Take 25 mg by mouth daily as needed for dizziness.   Nedra Tinnie LABOR, NP  Active Self, Pharmacy Records  oxyCODONE   10 MG TABS [id number] Yes Take 1 tablet (10 mg total) by mouth every 4 (four) hours as needed for up to 7 days for moderate pain (pain score 4-6) or severe pain (pain score 7-10) (pain). Pokhrel, Laxman, MD  Active   pregabalin  (LYRICA ) 100 MG capsule [id number] Yes Take 100 mg by mouth in the morning, at noon, in the evening, and at bedtime. [provider]  Active Self, Pharmacy Records           Med Note (CRUTHIS, CHLOE C   Tue Dec 30, 2023  9:06 AM) Pt stated he has been taking this medication QID.   ZEPBOUND  7.5 MG/0.5ML injection vial [id number] Yes INJECT 0.5 ML (7.5 MG) UNDER THE SKIN ONCE WEEKLY (0.5ML= 50 UNITS) McElwee, Lauren A, NP  Active Self, Pharmacy Records            Home Care and Equipment/Supplies: Were Home Health Services Ordered?: Yes Name of [facility] Health Agency:: [facility] Has Agency set up a time to come to your home?: Yes First Home Health Visit Date: 01/09/24 Any new equipment or medical supplies ordered?: Yes Name of Medical supply agency?: [facility] Were you able to get the equipment/medical supplies?: Yes Do you have any questions related to the use of the equipment/supplies?: No  Functional Questionnaire: Do you need assistance with bathing/showering or dressing?: No Do you need assistance with meal preparation?: No Do you need assistance with eating?: No Do you have difficulty maintaining continence: No Do you need assistance with getting out of bed/getting out of a chair/moving?: No Do you have difficulty managing or taking your medications?: No  Follow up appointments reviewed: PCP Follow-up appointment confirmed?: Yes Date of PCP follow-up  appointment?: 01/19/24 Follow-up Provider: [facility] Follow-up appointment confirmed?: Yes Date of Specialist follow-up appointment?: 01/13/24 Follow-Up Specialty Provider:: surgeon Do you need transportation to your follow-up appointment?: No Do you understand care options if your  condition(s) worsen?: Yes-patient verbalized understanding    SIGNATURE Julian Lemmings, LPN [facility] Nurse Health Advisor Direct Dial [phone]

## 2024-01-09 NOTE — Transitions of Care (Post Inpatient/ED Visit) (Signed)
   01/09/2024  Name: Kenneth Calhoun MRN: [id number] DOB: [birth year]  Today's TOC FU Call Status: Today's TOC FU Call Status:: Unsuccessful Call (1st Attempt) Unsuccessful Call (1st Attempt) Date: 01/09/24  Attempted to reach the patient regarding the most recent Inpatient/ED visit.  Follow Up Plan: Additional outreach attempts will be made to reach the patient to complete the Transitions of Care (Post Inpatient/ED visit) call.   Signature  Julian Lemmings, LPN [facility] Nurse Health Advisor Direct Dial [phone]

## 2024-01-15 ENCOUNTER — Encounter: Payer: Self-pay | Admitting: Nurse Practitioner

## 2024-01-15 ENCOUNTER — Ambulatory Visit (INDEPENDENT_AMBULATORY_CARE_PROVIDER_SITE_OTHER): Admitting: Nurse Practitioner

## 2024-01-15 VITALS — BP 116/68 | HR 72 | Temp 97.5°F | Ht 70.0 in | Wt 208.4 lb

## 2024-01-15 DIAGNOSIS — M5441 Lumbago with sciatica, right side: Secondary | ICD-10-CM | POA: Diagnosis not present

## 2024-01-15 DIAGNOSIS — E663 Overweight: Secondary | ICD-10-CM

## 2024-01-15 DIAGNOSIS — M869 Osteomyelitis, unspecified: Secondary | ICD-10-CM

## 2024-01-15 DIAGNOSIS — R197 Diarrhea, unspecified: Secondary | ICD-10-CM

## 2024-01-15 DIAGNOSIS — G8929 Other chronic pain: Secondary | ICD-10-CM

## 2024-01-15 MED ORDER — ZEPBOUND 7.5 MG/0.5ML ~~LOC~~ SOLN: 7.5000 mg | SUBCUTANEOUS | 0 refills | Status: DC

## 2024-01-15 NOTE — Progress Notes (Signed)
 Established Patient Office Visit  Subjective   Patient ID: Kenneth Calhoun, male    DOB: April 14, 1961  Age: 63 y.o. MRN: [id number]  Chief Complaint  Patient presents with   Toe Pain    Left foot great toe pain    HPI  Discussed the use of AI scribe software for clinical note transcription with the patient, who gave verbal consent to proceed.  History of Present Illness   Kenneth Calhoun is a 63 year old male who presents with ongoing nerve pain and a recent toe infection.  He was recently hospitalized for a severe toe infection, initially suspected to be gout, requiring a PICC line for IV antibiotics for six weeks. Home health visits occur every Friday for PICC line maintenance. He had an abscess in his toe drained during the hospitalization and it was positive for osteomyelitis. He had a follow-up with ortho and still has stitches in place for another 2-3 weeks. They are encouraging the incision to drain. He is wearing a boot to not have pressure on his toes.  He experiences ongoing tingling nerve pain since hospital discharge. His Lyrica  dosage was increased from 400 mg to 600 mg daily by [facility], and he is currently trialing this higher dose.  He experiences diarrhea, which he attributes to the antibiotics, with bowel movements varying from watery to drops. He denies abdominal cramping, pain, and fever.   No chest pain or shortness of breath. His white blood cell count was elevated and has decreased slightly since the last check.     Transition of Care Hospital Follow up.   Hospital/Facility: [facility] D/C Physician: Vernal Alstrom, MD D/C Date: 01/08/24  Records Requested: 01/15/24 Records Received: 01/15/24 Records Reviewed: 01/15/24  Diagnoses on Discharge: Osteomyelitis, obesity, mixed hyperlipidemia, CKD stage 3a, gout attack  Date of interactive Contact within 48 hours of discharge: 01/09/24 Contact was through: phone  Date of 7 day or 14 day  face-to-face visit:    within 7 days  Outpatient Encounter Medications as of 01/15/2024  Medication Sig Note   Acetaminophen  (TYLENOL  PO) Take by mouth as needed.    ceFAZolin  (ANCEF ) IVPB Inject 2 g into the vein every 8 (eight) hours. Indication:  L big toe osteomyelitis First Dose: Yes Last Day of Therapy:  02/15/24 Labs - Once weekly:  CBC/D and BMP, Labs - Once weekly: ESR and CRP Method of administration: IV Push Method of administration may be changed at the discretion of home infusion pharmacist based upon assessment of the patient and/or caregiver's ability to self-administer the medication ordered.    pregabalin  (LYRICA ) 100 MG capsule Take 100 mg by mouth in the morning, at noon, in the evening, and at bedtime. 12/30/2023: Pt stated he has been taking this medication QID.    JOURNAVX 50 MG TABS SMARTSIG:Tablet(s) (Patient not taking: Reported on 01/15/2024)    meclizine  (ANTIVERT ) 25 MG tablet Take 1 tablet (25 mg total) by mouth 3 (three) times daily as needed for dizziness. (Patient not taking: Reported on 01/15/2024)    oxyCODONE  10 MG TABS Take 1 tablet (10 mg total) by mouth every 4 (four) hours as needed for up to 7 days for moderate pain (pain score 4-6) or severe pain (pain score 7-10) (pain). (Patient not taking: Reported on 01/15/2024)    tirzepatide  (ZEPBOUND ) 7.5 MG/0.5ML injection vial Inject 7.5 mg into the skin once a week.    [DISCONTINUED] ZEPBOUND  7.5 MG/0.5ML injection vial INJECT 0.5 ML (7.5 MG)  UNDER THE SKIN ONCE WEEKLY (0.5ML= 50 UNITS) (Patient not taking: Reported on 01/15/2024)    No facility-administered encounter medications on file as of 01/15/2024.    Diagnostic Tests Reviewed/Disposition: Reviewed on chart  Consults: Orthopedics, infectious disease  Discharge Instructions -Continue IV antibiotics every 8 hours at home -Follow up with ortho and ID -Home Health RN -Wear offloading shoe on left foot  Disease/illness Education: Discussed with patient  Home  Health/Community Services Discussions/Referrals: Already set up with home health  Establishment or re-establishment of referral orders for community resources: N/A  Discussion with other health care providers: Reviewed notes on chart  Assessment and Support of treatment regimen adherence: Discussed with patient   Appointments Coordinated with: Already has appointments with ID and ortho  Education for self-management, independent living, and ADLs: Discussed with patient      ROS See pertinent positives and negatives per HPI.    Objective:     BP 116/68 (BP Location: Left Arm, Patient Position: Sitting, Cuff Size: Normal)   Pulse 72   Temp (!) 97.5 F (36.4 C)   Ht 5' 10 (1.778 m)   Wt 208 lb 6.4 oz (94.5 kg)   SpO2 99%   BMI 29.90 kg/m  BP Readings from Last 3 Encounters:  01/15/24 116/68  01/08/24 (!) 144/80  12/22/23 132/84   Wt Readings from Last 3 Encounters:  01/15/24 208 lb 6.4 oz (94.5 kg)  12/30/23 209 lb 7 oz (95 kg)  12/22/23 210 lb (95.3 kg)      Physical Exam Vitals and nursing note reviewed.  Constitutional:      Appearance: Normal appearance.  HENT:     Head: Normocephalic.  Eyes:     Conjunctiva/sclera: Conjunctivae normal.  Cardiovascular:     Rate and Rhythm: Normal rate and regular rhythm.     Pulses: Normal pulses.     Heart sounds: Normal heart sounds.  Pulmonary:     Effort: Pulmonary effort is normal.     Breath sounds: Normal breath sounds.  Musculoskeletal:     Cervical back: Normal range of motion.     Comments: Left foot in ortho boot  Skin:    General: Skin is warm.     Findings: Bruising present.     Comments: Left foot incision covered with gauze and ACE wrap.   Neurological:     General: No focal deficit present.     Mental Status: He is alert and oriented to person, place, and time.  Psychiatric:        Mood and Affect: Mood normal.        Behavior: Behavior normal.        Thought Content: Thought content normal.         Judgment: Judgment normal.    The 10-year ASCVD risk score (Arnett DK, et al., 2019) is: 9.9%    Assessment & Plan:   Problem List Items Addressed This Visit       Nervous and Auditory   Chronic right-sided low back pain with right-sided sciatica   Chronic peripheral neuropathy with persistent nerve pain is present. Lyrica  is increased to 400 mg, with a potential increase to 600 mg. He is under neurologist and pain management care. Continue Lyrica  at 400 mg, consider increasing to 600 mg if needed. Follow up with pain management on October 1st and continue care with the neurologist.      Relevant Medications   JOURNAVX 50 MG TABS   Acetaminophen  (TYLENOL  PO)   tirzepatide  (  ZEPBOUND ) 7.5 MG/0.5ML injection vial     Musculoskeletal and Integument   Osteomyelitis (HCC) - Primary   He was hospitalized and given IV antibiotics. Bone biopsy of left great toe did confirm osteomyelitis.. WBC count decreased from 15.6 to 13.6, indicating improvement. CRP and sed rate are normal, suggesting reduced inflammation. Continue IV antibiotics until October 5th. Home health will manage weekly PICC line care. Follow up with infectious disease on September 24th and with ortho as dicussed. Monitor WBC count and inflammatory markers. Imaging reviewed. Medication reconciliation completed. Follow- up in 2 months. Keep appointments with specialists.         Other   Overweight   BMI 29.9. He is ok to continue zepbound  7.5mg  weekly. Continue focusing on protein.       Relevant Medications   tirzepatide  (ZEPBOUND ) 7.5 MG/0.5ML injection vial   Diarrhea   Diarrhea is likely due to antibiotic use, disrupting gut flora. Probiotics are advised to restore balance as Metamucil is ineffective. Start probiotic supplementation. Monitor for worsening symptoms such as increased frequency of diarrhea, stomach cramping, or fever. Consider dietary adjustments to include yogurt or other sources of good bacteria.          Return in about 2 months (around 03/16/2024) for toe infection, weight .    Tinnie DELENA Harada, NP

## 2024-01-15 NOTE — Assessment & Plan Note (Signed)
 BMI 29.9. He is ok to continue zepbound  7.5mg  weekly. Continue focusing on protein.

## 2024-01-15 NOTE — Assessment & Plan Note (Addendum)
 He was hospitalized and given IV antibiotics. Bone biopsy of left great toe did confirm osteomyelitis.. WBC count decreased from 15.6 to 13.6, indicating improvement. CRP and sed rate are normal, suggesting reduced inflammation. Continue IV antibiotics until October 5th. Home health will manage weekly PICC line care. Follow up with infectious disease on September 24th and with ortho as dicussed. Monitor WBC count and inflammatory markers. Imaging reviewed. Medication reconciliation completed. Follow- up in 2 months. Keep appointments with specialists.

## 2024-01-15 NOTE — Assessment & Plan Note (Signed)
 Chronic peripheral neuropathy with persistent nerve pain is present. Lyrica  is increased to 400 mg, with a potential increase to 600 mg. He is under neurologist and pain management care. Continue Lyrica  at 400 mg, consider increasing to 600 mg if needed. Follow up with pain management on October 1st and continue care with the neurologist.

## 2024-01-15 NOTE — Patient Instructions (Signed)
 It was great to see you!  Start probiotic daily - you can get this overt the counter at any pharmacy ([facility])  Keep appointments with your specialist   You can keep taking the zepbound    Let's follow-up in 2 months, sooner if you have concerns.  If a referral was placed today, you will be contacted for an appointment. Please note that routine referrals can sometimes take up to 3-4 weeks to process. Please call our office if you haven't heard anything after this time frame.  Take care,  Tinnie Harada, NP

## 2024-01-15 NOTE — Addendum Note (Signed)
 Addended by: Harel Repetto A on: 01/15/2024 03:17 PM   Modules accepted: Orders

## 2024-01-15 NOTE — Assessment & Plan Note (Signed)
 Diarrhea is likely due to antibiotic use, disrupting gut flora. Probiotics are advised to restore balance as Metamucil is ineffective. Start probiotic supplementation. Monitor for worsening symptoms such as increased frequency of diarrhea, stomach cramping, or fever. Consider dietary adjustments to include yogurt or other sources of good bacteria.

## 2024-01-19 ENCOUNTER — Ambulatory Visit: Admitting: Nurse Practitioner

## 2024-01-21 ENCOUNTER — Other Ambulatory Visit (HOSPITAL_COMMUNITY): Payer: Self-pay

## 2024-01-21 ENCOUNTER — Telehealth: Payer: Self-pay

## 2024-01-21 NOTE — Telephone Encounter (Signed)
 Request:  Determination:  Pt gets Rx filled via [employer] Self pay. Due to his Insurance not covering [facility] drugs.  Please see T/C and Images below.

## 2024-01-21 NOTE — Telephone Encounter (Signed)
 Pharmacy Patient Advocate Encounter   Received notification from Onbase that prior authorization for Zepbound  7.5MG /0.5ML pen-injectors  is required/requested.   Insurance verification completed.   The patient is insured through [facility] .   Per test claim: PA required; PA submitted to above mentioned insurance via Latent Key/confirmation #/EOC [id number] Status is pending

## 2024-01-21 NOTE — Telephone Encounter (Signed)
 Pharmacy Patient Advocate Encounter  Received notification from [employer] that Prior Authorization for Zepbound  7.5MG /0.5ML pen-injectors  has been DENIED.  See denial reason below. No denial letter attached in CMM. Will attach denial letter to Media tab once received.   PA #/Case ID/Reference #: [id number]

## 2024-01-21 NOTE — Telephone Encounter (Signed)
 Can we get a prior auth of Zepbound  7.5mg /0.20ml?

## 2024-01-31 LAB — LAB REPORT - SCANNED: EGFR: 58

## 2024-02-04 ENCOUNTER — Other Ambulatory Visit: Payer: Self-pay

## 2024-02-04 ENCOUNTER — Telehealth: Payer: Self-pay

## 2024-02-04 ENCOUNTER — Ambulatory Visit (INDEPENDENT_AMBULATORY_CARE_PROVIDER_SITE_OTHER): Payer: Self-pay | Admitting: Internal Medicine

## 2024-02-04 VITALS — BP 105/68 | HR 76 | Temp 98.0°F | Resp 16 | Wt 208.0 lb

## 2024-02-04 DIAGNOSIS — M869 Osteomyelitis, unspecified: Secondary | ICD-10-CM

## 2024-02-04 NOTE — Telephone Encounter (Signed)
 Per Dr.Singh - PICC line can be pulled after last dose of IV abx on 02/15/2024. Orders given to [facility].   Meha Vidrine SHAUNNA Letters, CMA

## 2024-02-04 NOTE — Progress Notes (Signed)
 Patient: Kenneth Calhoun  DOB: Apr 21, 1961 MRN: [id number] PCP: Nedra Tinnie LABOR, NP    Patient Active Problem List   Diagnosis Date Noted   Diarrhea 01/15/2024   Osteomyelitis (HCC) 01/08/2024   Gout attack 12/30/2023   CKD stage 3a, GFR 45-59 ml/min (HCC) 12/22/2023   Overweight 12/25/2022   Mixed hyperlipidemia 12/25/2022   Obesity (BMI 30-39.9) 07/25/2022   Benign paroxysmal positional vertigo of right ear 06/26/2022   OSA on CPAP 04/25/2022   Chronic right-sided low back pain with right-sided sciatica 03/18/2022   Sensation of cold in lower extremity 03/18/2022   Hypersomnia with sleep apnea 02/27/2022   Sleep-related hypoxia 02/27/2022   At risk for central sleep apnea 02/27/2022   Screening for colon cancer 01/24/2022   Snoring 01/24/2022   IFG (impaired fasting glucose) 01/24/2022   Elevated serum creatinine 01/24/2022   History of fracture of lower leg 01/24/2022   Anxiety 01/24/2022   Umbilical hernia without obstruction and without gangrene 01/24/2022   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  Back Prilopentin sutured presents for hospital follow-up of left foot osteomyelitis of first MTP/septic arthritis status post debridement blood cultures growing MSSA.  Patient to start cefazolin  x 6 weeks  Review of Systems  All other systems reviewed and are negative.   Past Medical History:  Diagnosis Date   Arthritis    Depression 12/2021   The noted that he thinks i have Depression and possibly PTSD from my fall in July.  We are working on [employer] counseling now.   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     ceFAZolin  (ANCEF ) IVPB Inject 2 g into the vein every 8 (eight) hours. Indication:  L big toe osteomyelitis First Dose: Yes Last Day of Therapy:  02/15/24 Labs - Once weekly:  CBC/D and BMP, Labs - Once weekly: ESR and CRP Method of administration: IV Push Method of administration may be changed at the discretion of home infusion pharmacist based upon assessment of the patient and/or caregiver's ability to self-administer the medication ordered. 40 Units 0   JOURNAVX 50 MG TABS SMARTSIG:Tablet(s) (Patient not taking: Reported on 01/15/2024)     meclizine  (ANTIVERT ) 25 MG tablet Take 1 tablet (25 mg total) by mouth 3 (three) times daily as needed for dizziness. (Patient not taking: Reported on 01/15/2024) 90 tablet 1   pregabalin  (LYRICA ) 100 MG capsule Take 100 mg by mouth in the morning, at noon, in the evening, and at bedtime.     tirzepatide  (ZEPBOUND ) 7.5 MG/0.5ML injection vial Inject 7.5 mg into the skin once a week. 2 mL 0   No facility-administered medications prior to visit.     No Known Allergies  Social History   Tobacco Use   Smoking status: Never   Smokeless tobacco: Never  Vaping Use   Vaping status: Never Used  Substance Use Topics   Alcohol use: No   Drug use: No    Family History  Problem Relation Age of Onset   Alcohol  abuse Mother    Alcohol abuse Father    Arthritis Sister    Diabetes Sister    Obesity Sister    Arthritis Sister    Depression Sister    Arthritis Brother    Depression Sister    Diabetes Sister    Obesity Sister     Objective:  There were no vitals filed for this visit. There is no height or weight on file to calculate BMI.  Physical Exam Constitutional:      General: He is not in acute distress.    Appearance: He is normal weight. He is not toxic-appearing.  HENT:     Head: Normocephalic and  atraumatic.     Right Ear: External ear normal.     Left Ear: External ear normal.     Nose: No congestion or rhinorrhea.     Mouth/Throat:     Mouth: Mucous membranes are moist.     Pharynx: Oropharynx is clear.  Eyes:     Extraocular Movements: Extraocular movements intact.     Conjunctiva/sclera: Conjunctivae normal.     Pupils: Pupils are equal, round, and reactive to light.  Cardiovascular:     Rate and Rhythm: Normal rate and regular rhythm.     Heart sounds: No murmur heard.    No friction rub. No gallop.  Pulmonary:     Effort: Pulmonary effort is normal.     Breath sounds: Normal breath sounds.  Abdominal:     General: Abdomen is flat. Bowel sounds are normal.     Palpations: Abdomen is soft.  Musculoskeletal:        General: No swelling.     Cervical back: Normal range of motion and neck supple.  Skin:    General: Skin is warm and dry.  Neurological:     General: No focal deficit present.     Mental Status: He is oriented to person, place, and time.  Psychiatric:        Mood and Affect: Mood normal.     Lab Results: Lab Results  Component Value Date   WBC 15.4 (H) 01/07/2024   HGB 13.9 01/07/2024   HCT 42.9 01/07/2024   MCV 88.5 01/07/2024   PLT 303 01/07/2024    Lab Results  Component Value Date   CREATININE 1.21 01/07/2024   BUN 21 01/07/2024   NA 139 01/07/2024   K 3.9 01/07/2024   CL 101 01/07/2024   CO2 26 01/07/2024    Lab Results  Component Value Date   ALT 19 12/25/2022   AST 22 12/25/2022   ALKPHOS 75 12/25/2022   BILITOT 0.7 12/25/2022     Assessment & Plan:   63 year old male with history history of complex regional left lower extremity pain followed by chronic pain attributed due to left leg fracture in 2020 Repairex I&D.  Presents for hospital follow-up #Left foot osteomyelitis of first MTP septic arthritis status post I&D with OR cultures and bone biopsy growing MSSA. - Patient's been tolerating cefazolin  - Continue to follow  orthopedics. Seen on 918 noted doing well overall, progressing appropriately, sutures removed.  -.  Follow-up with ID 1 month from stopping abx  #PICC line 9/12 WBC 7, SCr 1.31 (baseline ~1.2), ESR 4, CRP 1 EOT 02/15/24 Pull picc after last dose of abx   Loney Stank, MD [facility]   02/04/24  8:37 AM I have personally spent 42 minutes involved in face-to-face and non-face-to-face activities for this  patient on the day of the visit. Professional time spent includes the following activities: Preparing to see the patient (review of tests), Obtaining and/or reviewing separately obtained history (admission/discharge record), Performing a medically appropriate examination and/or evaluation , Ordering medications/tests/procedures, referring and communicating with other health care professionals, Documenting clinical information in the EMR, Independently interpreting results (not separately reported), Communicating results to the patient/family/caregiver, Counseling and educating the patient/family/caregiver and Care coordination (not separately reported).

## 2024-02-07 LAB — LAB REPORT - SCANNED: EGFR: 61

## 2024-02-11 ENCOUNTER — Other Ambulatory Visit: Payer: Self-pay | Admitting: Nurse Practitioner

## 2024-02-11 DIAGNOSIS — E663 Overweight: Secondary | ICD-10-CM

## 2024-02-12 NOTE — Telephone Encounter (Signed)
 Requesting: ZEPBOUND  7.5 MG/0.5ML SUBCUTANEOUS SOLUTION  Last Visit: 01/15/2024 Next Visit: 03/16/2024 Last Refill: 01/15/2024  Please Advise

## 2024-02-14 LAB — LAB REPORT - SCANNED: EGFR: 57

## 2024-02-25 ENCOUNTER — Ambulatory Visit: Admitting: Nurse Practitioner

## 2024-03-11 ENCOUNTER — Other Ambulatory Visit: Payer: Self-pay | Admitting: Nurse Practitioner

## 2024-03-11 NOTE — Telephone Encounter (Signed)
 Requesting: Meclizine  HCl 25 MG Chew Tab Last Visit: 01/15/2024 Next Visit: 03/25/2024 Last Refill: 12/25/2023  Please Advise

## 2024-03-16 ENCOUNTER — Ambulatory Visit: Admitting: Nurse Practitioner

## 2024-03-17 ENCOUNTER — Encounter: Payer: Self-pay | Admitting: Internal Medicine

## 2024-03-17 ENCOUNTER — Other Ambulatory Visit: Payer: Self-pay

## 2024-03-17 ENCOUNTER — Ambulatory Visit (INDEPENDENT_AMBULATORY_CARE_PROVIDER_SITE_OTHER): Admitting: Internal Medicine

## 2024-03-17 VITALS — BP 131/83 | HR 74 | Temp 98.4°F | Ht 70.0 in | Wt 216.0 lb

## 2024-03-17 DIAGNOSIS — M869 Osteomyelitis, unspecified: Secondary | ICD-10-CM

## 2024-03-17 NOTE — Progress Notes (Signed)
 Patient: Kenneth Calhoun  DOB: 06-15-1960 MRN: [id number] PCP: Nedra Tinnie LABOR, NP    Chief Complaint  Patient presents with   Follow-up     Patient Active Problem List   Diagnosis Date Noted   Diarrhea 01/15/2024   Osteomyelitis (HCC) 01/08/2024   Gout attack 12/30/2023   CKD stage 3a, GFR 45-59 ml/min (HCC) 12/22/2023   Overweight 12/25/2022   Mixed hyperlipidemia 12/25/2022   Obesity (BMI 30-39.9) 07/25/2022   Benign paroxysmal positional vertigo of right ear 06/26/2022   OSA on CPAP 04/25/2022   Chronic right-sided low back pain with right-sided sciatica 03/18/2022   Sensation of cold in lower extremity 03/18/2022   Hypersomnia with sleep apnea 02/27/2022   Sleep-related hypoxia 02/27/2022   At risk for central sleep apnea 02/27/2022   Screening for colon cancer 01/24/2022   Snoring 01/24/2022   IFG (impaired fasting glucose) 01/24/2022   Elevated serum creatinine 01/24/2022   History of fracture of lower leg 01/24/2022   Anxiety 01/24/2022   Umbilical hernia without obstruction and without gangrene 01/24/2022   Urethral meatal stenosis 12/10/2021   Acute urinary retention 12/10/2021   Open displaced pilon fracture of left tibia 12/05/2021     Subjective:  DUWAYNE MATTERS is a 63 year old male with past medical history of OSA, hyperlipidemia, anxiety, left leg fracture in 2023 was requiring [facility] I&D managed with complex regional left lower extremity.  Back Prilopentin sutured presents for hospital follow-up of left foot osteomyelitis of first MTP/septic arthritis status post debridement blood cultures growing MSSA.  Patient to start cefazolin  x 6 weeks  Today: no new complaints. Wound healing well. Sees orhto tomrrow, sutures out.  Review of Systems  All other systems reviewed and are negative.   Past Medical History:  Diagnosis Date   Arthritis    Depression 12/2021   The noted that he thinks i have Depression and possibly PTSD from my fall in July.  We  are working on [employer] counseling now.   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    Outpatient Medications Prior to Visit  Medication Sig Dispense Refill   Acetaminophen  (TYLENOL  PO) Take by mouth as needed.     Meclizine  HCl 25 MG CHEW TAKE 1 TABLET BY MOUTH THREE TIMES DAILY AS NEEDED FOR DIZZINESS 90 tablet 1   pregabalin  (LYRICA ) 100 MG capsule Take 100 mg by mouth in the morning, at noon, in the evening, and at bedtime.     ZEPBOUND  7.5 MG/0.5ML injection vial INJECT 0.5 ML (7.5 MG) UNDER THE SKIN ONCE WEEKLY (0.5ML= 50 UNITS) 2 mL 0   JOURNAVX 50 MG TABS SMARTSIG:Tablet(s) (Patient not taking: Reported on 03/17/2024)     No facility-administered medications prior to visit.     No Known Allergies  Social History   Tobacco Use   Smoking status: Never   Smokeless tobacco: Never  Vaping Use   Vaping status: Never Used  Substance Use Topics   Alcohol use: No   Drug use: No    Family History  Problem Relation Age of Onset   Alcohol abuse Mother    Alcohol abuse Father    Arthritis Sister    Diabetes Sister    Obesity Sister    Arthritis Sister    Depression Sister    Arthritis Brother    Depression Sister    Diabetes Sister    Obesity Sister     Objective:   Vitals:  03/17/24 0937  Weight: 216 lb (98 kg)  Height: 5' 10 (1.778 m)   Body mass index is 30.99 kg/m.  Physical Exam Constitutional:      General: He is not in acute distress.    Appearance: He is normal weight. He is not toxic-appearing.  HENT:     Head: Normocephalic and atraumatic.     Right Ear: External ear normal.     Left Ear: External ear normal.     Nose: No congestion or rhinorrhea.     Mouth/Throat:     Mouth: Mucous membranes are moist.     Pharynx: Oropharynx is clear.  Eyes:     Extraocular Movements: Extraocular movements intact.     Conjunctiva/sclera: Conjunctivae normal.     Pupils: Pupils are equal, round, and reactive  to light.  Cardiovascular:     Rate and Rhythm: Normal rate and regular rhythm.     Heart sounds: No murmur heard.    No friction rub. No gallop.  Pulmonary:     Effort: Pulmonary effort is normal.     Breath sounds: Normal breath sounds.  Abdominal:     General: Abdomen is flat. Bowel sounds are normal.     Palpations: Abdomen is soft.  Musculoskeletal:     Cervical back: Normal range of motion and neck supple.  Skin:    General: Skin is warm and dry.  Neurological:     General: No focal deficit present.     Mental Status: He is oriented to person, place, and time.  Psychiatric:        Mood and Affect: Mood normal.     Lab Results: Lab Results  Component Value Date   WBC 15.4 (H) 01/07/2024   HGB 13.9 01/07/2024   HCT 42.9 01/07/2024   MCV 88.5 01/07/2024   PLT 303 01/07/2024    Lab Results  Component Value Date   CREATININE 1.21 01/07/2024   BUN 21 01/07/2024   NA 139 01/07/2024   K 3.9 01/07/2024   CL 101 01/07/2024   CO2 26 01/07/2024    Lab Results  Component Value Date   ALT 19 12/25/2022   AST 22 12/25/2022   ALKPHOS 75 12/25/2022   BILITOT 0.7 12/25/2022     Assessment & Plan:  63 year old male with history history of complex regional left lower extremity pain followed by chronic pain attributed due to left leg fracture in 2020 [facility] I&D.  Presents for hospital follow-up #Left foot osteomyelitis of first MTP septic arthritis status post I&D with OR cultures and bone biopsy growing MSSA. - Patient's been tolerating cefazolin  - Continue to follow orthopedics. Seen on 918 noted doing well overall, progressing appropriately, sutures removed.  -cefazolin  x 6 weeks till 10/5 Plan Labs today off of abx F/U with ID prn   #PICC line-removed  Loney Stank, MD [facility]   03/17/24  9:47 AM I have personally spent 42 minutes involved in face-to-face and non-face-to-face activities for this  patient on the day of the visit. Professional time spent includes the following activities: Preparing to see the patient (review of tests), Obtaining and/or reviewing separately obtained history (admission/discharge record), Performing a medically appropriate examination and/or evaluation , Ordering medications/tests/procedures, referring and communicating with other health care professionals, Documenting clinical information in the EMR, Independently interpreting results (not separately reported), Communicating results to the patient/family/caregiver, Counseling and educating the patient/family/caregiver and Care coordination (not separately reported).

## 2024-03-18 LAB — COMPLETE METABOLIC PANEL WITHOUT GFR
AG Ratio: 2.3 (calc) (ref 1.0–2.5)
ALT: 23 U/L (ref 9–46)
AST: 20 U/L (ref 10–35)
Albumin: 4.3 g/dL (ref 3.6–5.1)
Alkaline phosphatase (APISO): 56 U/L (ref 35–144)
BUN/Creatinine Ratio: 11 (calc) (ref 6–22)
BUN: 16 mg/dL (ref 7–25)
CO2: 29 mmol/L (ref 20–32)
Calcium: 8.7 mg/dL (ref 8.6–10.3)
Chloride: 107 mmol/L (ref 98–110)
Creat: 1.41 mg/dL — ABNORMAL HIGH (ref 0.70–1.35)
Globulin: 1.9 g/dL (ref 1.9–3.7)
Glucose, Bld: 72 mg/dL (ref 65–99)
Potassium: 4.5 mmol/L (ref 3.5–5.3)
Sodium: 142 mmol/L (ref 135–146)
Total Bilirubin: 0.5 mg/dL (ref 0.2–1.2)
Total Protein: 6.2 g/dL (ref 6.1–8.1)

## 2024-03-18 LAB — CBC WITH DIFFERENTIAL/PLATELET
Absolute Lymphocytes: 1468 {cells}/uL (ref 850–3900)
Absolute Monocytes: 795 {cells}/uL (ref 200–950)
Basophils Absolute: 33 {cells}/uL (ref 0–200)
Basophils Relative: 0.4 %
Eosinophils Absolute: 107 {cells}/uL (ref 15–500)
Eosinophils Relative: 1.3 %
HCT: 46.3 % (ref 38.5–50.0)
Hemoglobin: 15.7 g/dL (ref 13.2–17.1)
MCH: 29.6 pg (ref 27.0–33.0)
MCHC: 33.9 g/dL (ref 32.0–36.0)
MCV: 87.4 fL (ref 80.0–100.0)
MPV: 9.9 fL (ref 7.5–12.5)
Monocytes Relative: 9.7 %
Neutro Abs: 5797 {cells}/uL (ref 1500–7800)
Neutrophils Relative %: 70.7 %
Platelets: 260 Thousand/uL (ref 140–400)
RBC: 5.3 Million/uL (ref 4.20–5.80)
RDW: 13 % (ref 11.0–15.0)
Total Lymphocyte: 17.9 %
WBC: 8.2 Thousand/uL (ref 3.8–10.8)

## 2024-03-18 LAB — C-REACTIVE PROTEIN: CRP: <3 mg/L (ref <8.0)

## 2024-03-18 LAB — SEDIMENTATION RATE: Sed Rate: 6 mm/h (ref 0–20)

## 2024-03-25 ENCOUNTER — Ambulatory Visit: Admitting: Nurse Practitioner

## 2024-03-30 ENCOUNTER — Ambulatory Visit: Admitting: Nurse Practitioner

## 2024-03-30 ENCOUNTER — Other Ambulatory Visit (HOSPITAL_BASED_OUTPATIENT_CLINIC_OR_DEPARTMENT_OTHER): Payer: Self-pay

## 2024-03-30 ENCOUNTER — Encounter: Payer: Self-pay | Admitting: Nurse Practitioner

## 2024-03-30 VITALS — BP 114/72 | HR 76 | Temp 97.3°F | Ht 70.0 in | Wt 218.6 lb

## 2024-03-30 DIAGNOSIS — E669 Obesity, unspecified: Secondary | ICD-10-CM | POA: Diagnosis not present

## 2024-03-30 DIAGNOSIS — M869 Osteomyelitis, unspecified: Secondary | ICD-10-CM

## 2024-03-30 DIAGNOSIS — G4733 Obstructive sleep apnea (adult) (pediatric): Secondary | ICD-10-CM

## 2024-03-30 DIAGNOSIS — U071 COVID-19: Secondary | ICD-10-CM | POA: Insufficient documentation

## 2024-03-30 DIAGNOSIS — G8929 Other chronic pain: Secondary | ICD-10-CM

## 2024-03-30 DIAGNOSIS — J029 Acute pharyngitis, unspecified: Secondary | ICD-10-CM | POA: Diagnosis not present

## 2024-03-30 DIAGNOSIS — M5441 Lumbago with sciatica, right side: Secondary | ICD-10-CM

## 2024-03-30 MED ORDER — NIRMATRELVIR/RITONAVIR (PAXLOVID)TABLET: 3.0000 | ORAL_TABLET | Freq: Two times a day (BID) | ORAL | 0 refills | Status: DC

## 2024-03-30 MED ORDER — TIRZEPATIDE-WEIGHT MANAGEMENT 10 MG/0.5ML ~~LOC~~ SOLN
10.0000 mg | SUBCUTANEOUS | 2 refills | Status: AC
Start: 2024-03-30 — End: ?

## 2024-03-30 MED ORDER — NIRMATRELVIR/RITONAVIR (PAXLOVID)TABLET: 3.0000 | ORAL_TABLET | Freq: Two times a day (BID) | ORAL | 0 refills | Status: AC

## 2024-03-30 MED ORDER — ZEPBOUND 10 MG/0.5ML ~~LOC~~ SOAJ
10.0000 mg | SUBCUTANEOUS | 1 refills | Status: AC
  Filled 2024-03-30: qty 2, 28d supply, fill #0

## 2024-03-30 NOTE — Assessment & Plan Note (Signed)
 Zepbound  is indicated for OSA. He has an AHI index of 24.2 events per hour via sleep study on 02/27/22. Increase zepbound  to 10mg  as noted above.

## 2024-03-30 NOTE — Patient Instructions (Signed)
 It was great to see you!  Start paxlovid twice a day for 5 days  Drink plenty of fluids and get rest   Wash hands frequently  Increase zepbound  to 10mg  injection weekly   Let's follow-up in 3 months, sooner if you have concerns.  If a referral was placed today, you will be contacted for an appointment. Please note that routine referrals can sometimes take up to 3-4 weeks to process. Please call our office if you haven't heard anything after this time frame.  Take care,  Tinnie Harada, NP

## 2024-03-30 NOTE — Assessment & Plan Note (Signed)
 He is still healing from the surgery and is having some trouble with the incision healing. His ortho has referred him to wound management. Encouraged him to keep this appointment.

## 2024-03-30 NOTE — Assessment & Plan Note (Signed)
 Chronic back pain with right-sided sciatica. Previous treatments ineffective. Lyrica  provides some relief. He is talking with his specialist for potential spinal cord stimulator.

## 2024-03-30 NOTE — Assessment & Plan Note (Signed)
 Start Paxlovid twice daily for 5 days. Advised rest, hydration, and isolation to prevent spread. Recommended Tylenol  Cold and Sinus for congestion.

## 2024-03-30 NOTE — Progress Notes (Signed)
 Established Patient Office Visit  Subjective   Patient ID: Kenneth Calhoun, male    DOB: February 17, 1961  Age: 63 y.o. MRN: [id number]  Chief Complaint  Patient presents with   Osteomyelitis of left foot, unspecified type (HCC)    Follow up, concerns with stuffy nose and sore throat, weight gain    HPI Discussed the use of AI scribe software for clinical note transcription with the patient, who gave verbal consent to proceed.  History of Present Illness   Kenneth Calhoun is a 63 year old male who presents with congestion and sore throat.   He woke up with a sore throat and congestion overnight. His brother had similar symptoms three to four weeks ago, and his mother, who resides at [facility], also experienced a sore throat and was treated with Tylenol  and antibiotics. He visited her recently and kissed her on the head, suspecting this as a potential source of his symptoms. His mother was swabbed for COVID-19 and flu, with results pending. No coughing, shortness of breath, trouble breathing, fever, or ear pain.  He also notes a stall with weight loss. He has gained a few pounds over the last few months. He has not changed what he is eating and has been exercising as able with his chronic back pain and neuropathy. He is still taking zepbound  7.5mg  weekly.       ROS See pertinent positives and negatives per HPI.    Objective:     BP 114/72 (BP Location: Left Arm, Patient Position: Sitting, Cuff Size: Normal)   Pulse 76   Temp (!) 97.3 F (36.3 C)   Ht 5' 10 (1.778 m)   Wt 218 lb 9.6 oz (99.2 kg)   SpO2 96%   BMI 31.37 kg/m  BP Readings from Last 3 Encounters:  03/30/24 114/72  03/17/24 131/83  02/04/24 105/68   Wt Readings from Last 3 Encounters:  03/30/24 218 lb 9.6 oz (99.2 kg)  03/17/24 216 lb (98 kg)  02/04/24 208 lb (94.3 kg)      Physical Exam Vitals and nursing note reviewed.  Constitutional:      Appearance: Normal appearance.  HENT:     Head:  Normocephalic.     Right Ear: Tympanic membrane, ear canal and external ear normal.     Left Ear: Tympanic membrane, ear canal and external ear normal.     Mouth/Throat:     Mouth: Mucous membranes are moist.     Pharynx: Posterior oropharyngeal erythema present. No oropharyngeal exudate.  Eyes:     Conjunctiva/sclera: Conjunctivae normal.  Cardiovascular:     Rate and Rhythm: Normal rate and regular rhythm.     Pulses: Normal pulses.     Heart sounds: Normal heart sounds.  Pulmonary:     Effort: Pulmonary effort is normal.     Breath sounds: Normal breath sounds.  Musculoskeletal:     Cervical back: Normal range of motion and neck supple. No tenderness.  Lymphadenopathy:     Cervical: No cervical adenopathy.  Skin:    General: Skin is warm.  Neurological:     General: No focal deficit present.     Mental Status: He is alert and oriented to person, place, and time.  Psychiatric:        Mood and Affect: Mood normal.        Behavior: Behavior normal.        Thought Content: Thought content normal.  Judgment: Judgment normal.    The 10-year ASCVD risk score (Arnett DK, et al., 2019) is: 9.6%     Assessment & Plan:   Problem List Items Addressed This Visit       Respiratory   OSA on CPAP   Zepbound  is indicated for OSA. He has an AHI index of 24.2 events per hour via sleep study on 02/27/22. Increase zepbound  to 10mg  as noted above.       Relevant Medications   tirzepatide  (ZEPBOUND ) 10 MG/0.5ML Pen     Nervous and Auditory   Chronic right-sided low back pain with right-sided sciatica   Chronic back pain with right-sided sciatica. Previous treatments ineffective. Lyrica  provides some relief. He is talking with his specialist for potential spinal cord stimulator.       Relevant Medications   tirzepatide  10 MG/0.5ML injection vial   tirzepatide  (ZEPBOUND ) 10 MG/0.5ML Pen     Musculoskeletal and Integument   Osteomyelitis (HCC)   He is still healing from the  surgery and is having some trouble with the incision healing. His ortho has referred him to wound management. Encouraged him to keep this appointment.       Relevant Medications   nirmatrelvir/ritonavir (PAXLOVID) 20 x 150 MG & 10 x 100MG  TABS     Other   Obesity (BMI 30-39.9)   BMI 31.3. Weight gain despite current regimen. Increased Zepbound  to 10 mg, with potential increase to 15 mg. He does have a history of sleep apnea, will check and see if this is covered under this diagnosis.       Relevant Medications   tirzepatide  10 MG/0.5ML injection vial   tirzepatide  (ZEPBOUND ) 10 MG/0.5ML Pen   COVID-19 - Primary   Start Paxlovid twice daily for 5 days. Advised rest, hydration, and isolation to prevent spread. Recommended Tylenol  Cold and Sinus for congestion.      Relevant Medications   nirmatrelvir/ritonavir (PAXLOVID) 20 x 150 MG & 10 x 100MG  TABS   Other Visit Diagnoses       Sore throat       POC flu negative, covid-19 positive. See plan above. Continue warm fluids and tylenol /ibuprofen as needed.       Return in about 3 months (around 06/30/2024) for weight management .    Tinnie DELENA Harada, NP

## 2024-03-30 NOTE — Assessment & Plan Note (Signed)
 BMI 31.3. Weight gain despite current regimen. Increased Zepbound  to 10 mg, with potential increase to 15 mg. He does have a history of sleep apnea, will check and see if this is covered under this diagnosis.

## 2024-03-31 ENCOUNTER — Other Ambulatory Visit (HOSPITAL_BASED_OUTPATIENT_CLINIC_OR_DEPARTMENT_OTHER): Payer: Self-pay

## 2024-04-01 ENCOUNTER — Other Ambulatory Visit (HOSPITAL_BASED_OUTPATIENT_CLINIC_OR_DEPARTMENT_OTHER): Payer: Self-pay

## 2024-04-02 ENCOUNTER — Other Ambulatory Visit (HOSPITAL_BASED_OUTPATIENT_CLINIC_OR_DEPARTMENT_OTHER): Payer: Self-pay

## 2024-04-05 ENCOUNTER — Other Ambulatory Visit (HOSPITAL_BASED_OUTPATIENT_CLINIC_OR_DEPARTMENT_OTHER): Payer: Self-pay

## 2024-04-12 ENCOUNTER — Encounter (HOSPITAL_BASED_OUTPATIENT_CLINIC_OR_DEPARTMENT_OTHER): Attending: Internal Medicine | Admitting: Internal Medicine

## 2024-04-12 DIAGNOSIS — M86672 Other chronic osteomyelitis, left ankle and foot: Secondary | ICD-10-CM | POA: Diagnosis not present

## 2024-04-12 DIAGNOSIS — L97521 Non-pressure chronic ulcer of other part of left foot limited to breakdown of skin: Secondary | ICD-10-CM | POA: Diagnosis not present
